# Patient Record
Sex: Female | Born: 1990 | Race: White | Hispanic: No | Marital: Married | Smoking: Never smoker
Health system: Southern US, Community
[De-identification: ages and names within clinical notes are randomized; demographics above are authoritative.]

## PROBLEM LIST (undated history)

## (undated) DIAGNOSIS — B019 Varicella without complication: Secondary | ICD-10-CM

## (undated) DIAGNOSIS — Z8041 Family history of malignant neoplasm of ovary: Secondary | ICD-10-CM

## (undated) DIAGNOSIS — F32A Depression, unspecified: Secondary | ICD-10-CM

## (undated) DIAGNOSIS — F329 Major depressive disorder, single episode, unspecified: Secondary | ICD-10-CM

## (undated) DIAGNOSIS — B009 Herpesviral infection, unspecified: Secondary | ICD-10-CM

## (undated) DIAGNOSIS — F419 Anxiety disorder, unspecified: Secondary | ICD-10-CM

## (undated) DIAGNOSIS — Z808 Family history of malignant neoplasm of other organs or systems: Secondary | ICD-10-CM

## (undated) DIAGNOSIS — Z8042 Family history of malignant neoplasm of prostate: Secondary | ICD-10-CM

## (undated) HISTORY — DX: Anxiety disorder, unspecified: F41.9

## (undated) HISTORY — DX: Varicella without complication: B01.9

## (undated) HISTORY — DX: Herpesviral infection, unspecified: B00.9

## (undated) HISTORY — DX: Major depressive disorder, single episode, unspecified: F32.9

## (undated) HISTORY — DX: Family history of malignant neoplasm of other organs or systems: Z80.8

## (undated) HISTORY — DX: Family history of malignant neoplasm of ovary: Z80.41

## (undated) HISTORY — PX: WISDOM TOOTH EXTRACTION: SHX21

## (undated) HISTORY — DX: Depression, unspecified: F32.A

## (undated) HISTORY — DX: Family history of malignant neoplasm of prostate: Z80.42

---

## 2011-07-02 DIAGNOSIS — B009 Herpesviral infection, unspecified: Secondary | ICD-10-CM

## 2011-07-02 HISTORY — DX: Herpesviral infection, unspecified: B00.9

## 2011-08-31 ENCOUNTER — Ambulatory Visit (INDEPENDENT_AMBULATORY_CARE_PROVIDER_SITE_OTHER): Payer: BC Managed Care – HMO | Admitting: Obstetrics and Gynecology

## 2011-08-31 ENCOUNTER — Encounter: Payer: Self-pay | Admitting: Obstetrics and Gynecology

## 2011-08-31 VITALS — BP 94/58 | Ht 66.0 in | Wt 133.0 lb

## 2011-08-31 DIAGNOSIS — R110 Nausea: Secondary | ICD-10-CM

## 2011-08-31 DIAGNOSIS — N390 Urinary tract infection, site not specified: Secondary | ICD-10-CM

## 2011-08-31 DIAGNOSIS — A6000 Herpesviral infection of urogenital system, unspecified: Secondary | ICD-10-CM

## 2011-08-31 DIAGNOSIS — N946 Dysmenorrhea, unspecified: Secondary | ICD-10-CM

## 2011-08-31 LAB — POCT URINALYSIS DIPSTICK
Blood, UA: NEGATIVE
Ketones, UA: NEGATIVE
Protein, UA: NEGATIVE
Spec Grav, UA: 1.015
pH, UA: 5

## 2011-08-31 LAB — POCT URINE PREGNANCY: Preg Test, Ur: NEGATIVE

## 2011-08-31 NOTE — Progress Notes (Signed)
Contraception: none History of STD:  hsv 2  History of ovarian cyst: yes:  12/12 History of fibroids: no History of endometriosis:no Previous ultrasound:yes:  12/12  Urinary symptoms: none Gastro-intestinal symptoms:  Constipation: no     Diarrhea: no     Nausea: yes     Vomiting: no     Fever: no Vaginal discharge: consistency thin mucoid  Subjective:    Valerie Graham is a 21 y.o. female, G0P0000, who presents for irregular cycles,menometrorrhagia and severe and  Dysmenorrhea. Trial of tri-sprintec x 1 month with no change because pt d/c before completing. Lysteda for 2 cycles = no change. Sono 03/2012 normal with small hemorrhagic cyst (CL cyst)  Menarche at 13. Cycle was normal until 2 years ago. BTB was during aborted trial of OCP.    History   Social History  . Marital Status: Single    Spouse Name: N/A    Number of Children: N/A  . Years of Education: N/A   Social History Main Topics  . Smoking status: Never Smoker   . Smokeless tobacco: Never Used  . Alcohol Use: No  . Drug Use: No  . Sexually Active: Yes    Birth Control/ Protection: None   Other Topics Concern  . None   Social History Narrative  . None    Menstrual cycle:   LMP: Patient's last menstrual period was 08/19/2011.           Cycle: flow is excessive with use of 6 pads or tampons on heaviest days, irregular occurring approximately every 14-30 days with intermenstrual spotting, usually lasting 4-5 to days and Regular,with clots up to 1-2 cm and  severe dysmenorrha starting 1 week before and lasting 2 extra days.Pain is low pelvic,7/10, Ibuprofen = 3/10, radiates to legs.No dyspareunia.  The following portions of the patient's history were reviewed and updated as appropriate: allergies, current medications, past family history, past medical history, past social history, past surgical history and problem list.  Review of Systems Pertinent items are noted in HPI. Breast:Negative for breast  lump,nipple discharge or nipple retraction Gastrointestinal: Negative for abdominal pain, change in bowel habits or rectal bleeding Urinary:negative   Objective:    BP 94/58  Ht 5\' 6"  (1.676 m)  Wt 133 lb (60.328 kg)  BMI 21.47 kg/m2  LMP 08/19/2011    Weight:  Wt Readings from Last 1 Encounters:  08/31/11 133 lb (60.328 kg)          BMI: Body mass index is 21.47 kg/(m^2).  General Appearance: Alert, appropriate appearance for age. No acute distress HEENT: Grossly normal Neck / Thyroid: Supple, no masses, nodes or enlargement Lungs: clear to auscultation bilaterally Back: No CVA tenderness Cardiovascular: Regular rate and rhythm. S1, S2, no murmur Gastrointestinal: Soft, non-tender, no masses or organomegaly Pelvic Exam: Adnexa: normal bimanual exam Uterus: normal single, nontender and anteverted Rectovaginal: not indicated Lymphatic Exam: Non-palpable nodes in neck, clavicular, axillary, or inguinal regions Skin: no rash or abnormalities Neurologic: Normal gait and speech, no tremor  Psychiatric: Alert and oriented, appropriate affect.   Wet Prep:not applicable Urinalysis:not applicable UPT: Not done   Assessment:    Normal gyn exam Primary dysmenorrhea. No high suspicion of endometriosis    Plan:    Lo-lo x 3 months  Combined oral contraceptives reviewed With expected benefits of: cycle control, reduction in menstrual flow and dysmenorrhea, improvement of PMS and reduction of ovarian cysts. Risks of DVT/PE discussed. FU 3 months      Estalene Bergey AMD

## 2011-10-02 ENCOUNTER — Telehealth: Payer: Self-pay | Admitting: Obstetrics and Gynecology

## 2011-10-02 NOTE — Telephone Encounter (Signed)
Valerie Graham/SR pt/elect.

## 2011-10-02 NOTE — Telephone Encounter (Signed)
Pt states that since starting BCP from last ov, she has been having constant cramping like period cramps.  She takes advil with relief but never goes away completely.  Denies bowel symptoms.  Appt or observe?  ld

## 2011-10-02 NOTE — Telephone Encounter (Signed)
LMTC @ 3:05  ld

## 2011-10-04 NOTE — Telephone Encounter (Signed)
Please schedule appointment.

## 2011-10-06 ENCOUNTER — Telehealth: Payer: Self-pay | Admitting: Obstetrics and Gynecology

## 2011-10-06 NOTE — Telephone Encounter (Signed)
LM that pt needs to sch appt to see SR per SR.  Pt can speak with me or anyone in scheduling for next available.  ld

## 2011-10-06 NOTE — Telephone Encounter (Signed)
appt sch 10-26-11  ld

## 2011-10-06 NOTE — Telephone Encounter (Signed)
Laura/SR pt. °

## 2011-10-26 ENCOUNTER — Encounter: Payer: BC Managed Care – HMO | Admitting: Obstetrics and Gynecology

## 2011-11-01 ENCOUNTER — Encounter: Payer: BC Managed Care – HMO | Admitting: Obstetrics and Gynecology

## 2012-05-01 HISTORY — PX: TONSILLECTOMY: SUR1361

## 2015-05-24 ENCOUNTER — Encounter: Payer: Self-pay | Admitting: Gastroenterology

## 2015-07-13 ENCOUNTER — Ambulatory Visit: Payer: Self-pay | Admitting: Gastroenterology

## 2017-02-15 ENCOUNTER — Other Ambulatory Visit: Payer: Self-pay

## 2017-02-15 ENCOUNTER — Emergency Department (INDEPENDENT_AMBULATORY_CARE_PROVIDER_SITE_OTHER): Payer: 59

## 2017-02-15 ENCOUNTER — Emergency Department (INDEPENDENT_AMBULATORY_CARE_PROVIDER_SITE_OTHER)
Admission: EM | Admit: 2017-02-15 | Discharge: 2017-02-15 | Disposition: A | Discharge status: Home / Self Care | Payer: 59 | Source: Home / Self Care | Attending: Internal Medicine | Admitting: Internal Medicine

## 2017-02-15 DIAGNOSIS — R0602 Shortness of breath: Secondary | ICD-10-CM

## 2017-02-15 DIAGNOSIS — R509 Fever, unspecified: Secondary | ICD-10-CM | POA: Diagnosis not present

## 2017-02-15 DIAGNOSIS — B349 Viral infection, unspecified: Secondary | ICD-10-CM

## 2017-02-15 DIAGNOSIS — R079 Chest pain, unspecified: Secondary | ICD-10-CM

## 2017-02-15 LAB — POCT URINE PREGNANCY: PREG TEST UR: NEGATIVE

## 2017-02-15 LAB — POCT RAPID STREP A (OFFICE): RAPID STREP A SCREEN: NEGATIVE

## 2017-02-15 MED ORDER — ALBUTEROL SULFATE HFA 108 (90 BASE) MCG/ACT IN AERS: 1.0000 | INHALATION_SPRAY | Freq: Four times a day (QID) | RESPIRATORY_TRACT | 0 refills | Status: DC | PRN

## 2017-02-15 MED ORDER — BENZONATATE 100 MG PO CAPS: 100.0000 mg | ORAL_CAPSULE | Freq: Three times a day (TID) | ORAL | 0 refills | Status: DC

## 2017-02-15 MED ORDER — FLUTICASONE PROPIONATE 50 MCG/ACT NA SUSP: 2.0000 | Freq: Every day | NASAL | 0 refills | Status: DC

## 2017-02-15 MED ORDER — CETIRIZINE-PSEUDOEPHEDRINE ER 5-120 MG PO TB12: 1.0000 | ORAL_TABLET | Freq: Every day | ORAL | 0 refills | Status: DC

## 2017-02-15 MED ORDER — HYDROCOD POLST-CPM POLST ER 10-8 MG/5ML PO SUER: 5.0000 mL | Freq: Two times a day (BID) | ORAL | 0 refills | Status: DC | PRN

## 2017-02-15 MED FILL — BENZONATATE 100 MG CAPS: 100 | 7 days supply | Qty: 21 | Fill #0

## 2017-02-15 MED FILL — HYDROCODONE-CHLORPHEN ER SU: 10-8 | 12 days supply | Qty: 115 | Fill #0

## 2017-02-15 MED FILL — VENTOLIN HFA 90 MCG INHALER: 108 (90 BAS | 25 days supply | Qty: 18 | Fill #0

## 2017-02-15 NOTE — ED Triage Notes (Signed)
Pt stated that yesterday morning had a runny nose, worked.  After work, started coughing, took Robitussin, and woke up during the night with chest hurting, upper back pain, sore throat and chills.

## 2017-02-15 NOTE — ED Provider Notes (Signed)
[facility]    CSN: [id number] Arrival date & time: 02/15/17  0904      History   Chief Complaint Chief Complaint  Patient presents with  . Nasal Congestion  . Fever  . Generalized Body Aches    HPI Valerie Graham is a 26 y.o. female.   26 year old female comes in for 2 day history of nasal congestion, subjective fever, body aches. She states symptoms first started as rhinorrhea, and is now also having productive cough, sore throat, right ear pain. She has also noticed shortness of breath and dyspnea on exertion, she thinks it is due to nasal congestion and having to breath out of her mouth. She is also exhibiting pleuritic chest pain and upper back pain. Has been trying robitussin with minimal relief. Denies sick contact. Denies history of seasonal allergies, asthma. Never smoker.   Patient denies history of PE/DVT, no recent long hours of immobilization, no birth control use, no unilateral leg swelling.       Past Medical History:  Diagnosis Date  . Herpes simplex without mention of complication 07/02/11    Patient Active Problem List   Diagnosis Date Noted  . Herpes genitalis 08/31/2011    History reviewed. No pertinent surgical history.  OB History    Gravida Para Term Preterm AB Living   0 0 0 0 0 0   SAB TAB Ectopic Multiple Live Births   0 0 0 0         Home Medications    Prior to Admission medications   Medication Sig Start Date End Date Taking? Authorizing Provider  albuterol (PROVENTIL HFA;VENTOLIN HFA) 108 (90 Base) MCG/ACT inhaler Inhale 1-2 puffs into the lungs every 6 (six) hours as needed for wheezing or shortness of breath. 02/15/17   Cathie Hoops, Kelsey Durflinger V, PA-C  benzonatate (TESSALON) 100 MG capsule Take 1 capsule (100 mg total) by mouth every 8 (eight) hours. 02/15/17   Cathie Hoops, Anayeli Arel V, PA-C  cetirizine-pseudoephedrine (ZYRTEC-D) 5-120 MG tablet Take 1 tablet by mouth daily. 02/15/17   Cathie Hoops, Mikhai Bienvenue V, PA-C  chlorpheniramine-HYDROcodone (TUSSIONEX  PENNKINETIC ER) 10-8 MG/5ML SUER Take 5 mLs by mouth every 12 (twelve) hours as needed for cough. 02/15/17   Cathie Hoops, Aveer Bartow V, PA-C  fluticasone (FLONASE) 50 MCG/ACT nasal spray Place 2 sprays into both nostrils daily. 02/15/17   Cathie Hoops, Jesyca Weisenburger V, PA-C  ibuprofen (ADVIL,MOTRIN) 800 MG tablet Take 800 mg by mouth every 8 (eight) hours as needed.    [provider]  tranexamic acid (LYSTEDA) 650 MG TABS Take 1,300 mg by mouth.    [provider]    Family History Family History  Problem Relation Age of Onset  . Cancer Paternal Grandfather   . Cancer Maternal Grandmother   . Diabetes Maternal Aunt     Social History Social History  Substance Use Topics  . Smoking status: Never Smoker  . Smokeless tobacco: Never Used  . Alcohol use No     Allergies   Patient has no known allergies.   Review of Systems Review of Systems  Reason unable to perform ROS: See HPI as above.     Physical Exam Triage Vital Signs ED Triage Vitals [02/15/17 0921]  Enc Vitals Group     BP 109/75     Pulse Rate (!) 111     Resp      Temp 99.5 F (37.5 C)     Temp Source Oral     SpO2 97 %  Weight 180 lb (81.6 kg)     Height 5\' 7"  (1.702 m)     Head Circumference      Peak Flow      Pain Score      Pain Loc      Pain Edu?      Excl. in GC?    No data found.   Updated Vital Signs BP 109/75 (BP Location: Left Arm)   Pulse (!) 111   Temp 99.5 F (37.5 C) (Oral)   Ht 5\' 7"  (1.702 m)   Wt 180 lb (81.6 kg)   LMP 01/18/2017   SpO2 97%   BMI 28.19 kg/m   Physical Exam  Constitutional: She is oriented to person, place, and time. She appears well-developed and well-nourished. No distress.  HENT:  Head: Normocephalic and atraumatic.  Right Ear: External ear and ear canal normal. Tympanic membrane is erythematous. Tympanic membrane is not bulging.  Left Ear: External ear and ear canal normal. Tympanic membrane is erythematous. Tympanic membrane is not bulging.  Nose: Mucosal edema  and rhinorrhea present. Right sinus exhibits no maxillary sinus tenderness and no frontal sinus tenderness. Left sinus exhibits no maxillary sinus tenderness and no frontal sinus tenderness.  Mouth/Throat: Uvula is midline and mucous membranes are normal. Posterior oropharyngeal erythema present.  Eyes: Pupils are equal, round, and reactive to light. Conjunctivae are normal.  Neck: Normal range of motion. Neck supple.  Cardiovascular: Normal rate, regular rhythm and normal heart sounds.  Exam reveals no gallop and no friction rub.   No murmur heard. Pulmonary/Chest: Effort normal.  Expiratory wheezing on left lower base with mild crackles that is not resolved by cough.   Musculoskeletal:  No swelling, erythema, increased warmth. Negative Homan's   Lymphadenopathy:    She has no cervical adenopathy.  Neurological: She is alert and oriented to person, place, and time.  Skin: Skin is warm and dry.  Psychiatric: She has a normal mood and affect. Her behavior is normal. Judgment normal.    UC Treatments / Results  Labs (all labs ordered are listed, but only abnormal results are displayed) Labs Reviewed  POCT RAPID STREP A (OFFICE)  POCT URINE PREGNANCY    EKG  EKG Interpretation None       Radiology Dg Chest 2 View  Result Date: 02/15/2017 CLINICAL DATA:  Shortness of breath, chest pain. EXAM: CHEST  2 VIEW COMPARISON:  None. FINDINGS: The heart size and mediastinal contours are within normal limits. Both lungs are clear. No pneumothorax or pleural effusion is noted. The visualized skeletal structures are unremarkable. IMPRESSION: No active cardiopulmonary disease. Electronically Signed   By: Lupita RaiderJames  Green Jr, M.D.   On: 02/15/2017 10:06    Procedures Procedures (including critical care time)  Medications Ordered in UC Medications - No data to display   Initial Impression / Assessment and Plan / UC Course  I have reviewed the triage vital signs and the nursing  notes.  Pertinent labs & imaging results that were available during my care of the patient were reviewed by me and considered in my medical decision making (see chart for details).  Clinical Course as of Feb 16 1040  Thu Feb 15, 2017  1024 Recheck pulse irregular. Will check EKG  [AY]    Clinical Course User Index [AY] Kaaren Nass V, PA-C   CXR negative for pneumonia. EKG showed sinus tachycardia with occasional PVCs, 103 bpm, no other acute changes seen. Patient's HR improved with fluid intake, patient without risk  factors for DVT/PE, low suspicion for PE at this time. Discussed with patient symptoms could be due to viral illness. Given shortness of breath with mild wheezing on exam, will give albuterol inhaler PRN. Other symptomatic treatment provided. Return precautions given.   Final Clinical Impressions(s) / UC Diagnoses   Final diagnoses:  Viral illness    New Prescriptions New Prescriptions   ALBUTEROL (PROVENTIL HFA;VENTOLIN HFA) 108 (90 BASE) MCG/ACT INHALER    Inhale 1-2 puffs into the lungs every 6 (six) hours as needed for wheezing or shortness of breath.   BENZONATATE (TESSALON) 100 MG CAPSULE    Take 1 capsule (100 mg total) by mouth every 8 (eight) hours.   CETIRIZINE-PSEUDOEPHEDRINE (ZYRTEC-D) 5-120 MG TABLET    Take 1 tablet by mouth daily.   CHLORPHENIRAMINE-HYDROCODONE (TUSSIONEX PENNKINETIC ER) 10-8 MG/5ML SUER    Take 5 mLs by mouth every 12 (twelve) hours as needed for cough.   FLUTICASONE (FLONASE) 50 MCG/ACT NASAL SPRAY    Place 2 sprays into both nostrils daily.      Belinda Fisher, PA-C 02/15/17 1042

## 2017-02-15 NOTE — Discharge Instructions (Signed)
CXR negative for pneumonia. EKG without acute changes. Tessalon for cough. Tussionex for cough at night. Albuterol inhaler for shortness of breath/wheezing. Start flonase, zyrtec-D for nasal congestion. You can use over the counter nasal saline rinse such as neti pot for nasal congestion. Keep hydrated, your urine should be clear to pale yellow in color. Tylenol/motrin for fever and pain. Monitor for any worsening of symptoms, chest pain, shortness of breath, wheezing, swelling of the throat, go to the emergency department for further evaluation.

## 2017-07-12 ENCOUNTER — Encounter: Payer: Self-pay | Admitting: Nurse Practitioner

## 2017-07-12 ENCOUNTER — Ambulatory Visit (INDEPENDENT_AMBULATORY_CARE_PROVIDER_SITE_OTHER): Payer: No Typology Code available for payment source | Admitting: Nurse Practitioner

## 2017-07-12 ENCOUNTER — Ambulatory Visit (INDEPENDENT_AMBULATORY_CARE_PROVIDER_SITE_OTHER): Payer: No Typology Code available for payment source

## 2017-07-12 VITALS — BP 104/72 | HR 69 | Temp 97.7°F | Ht 67.0 in | Wt 178.0 lb

## 2017-07-12 DIAGNOSIS — R1030 Lower abdominal pain, unspecified: Secondary | ICD-10-CM

## 2017-07-12 DIAGNOSIS — F325 Major depressive disorder, single episode, in full remission: Secondary | ICD-10-CM

## 2017-07-12 DIAGNOSIS — K5900 Constipation, unspecified: Secondary | ICD-10-CM

## 2017-07-12 LAB — POCT URINALYSIS DIPSTICK
Bilirubin, UA: NEGATIVE
Glucose, UA: NEGATIVE
KETONES UA: NEGATIVE
Leukocytes, UA: NEGATIVE
NITRITE UA: NEGATIVE
PH UA: 6 (ref 5.0–8.0)
PROTEIN UA: NEGATIVE
RBC UA: NEGATIVE
SPEC GRAV UA: 1.02 (ref 1.010–1.025)
UROBILINOGEN UA: 0.2 U/dL

## 2017-07-12 LAB — POCT URINE PREGNANCY: Preg Test, Ur: NEGATIVE

## 2017-07-12 MED ORDER — SENNOSIDES-DOCUSATE SODIUM 8.6-50 MG PO TABS: 1.0000 | ORAL_TABLET | Freq: Every evening | ORAL | 0 refills | Status: DC | PRN

## 2017-07-12 MED ORDER — BUSPIRONE HCL 15 MG PO TABS: 15.0000 mg | ORAL_TABLET | Freq: Two times a day (BID) | ORAL | 5 refills | Status: DC

## 2017-07-12 MED FILL — busPIRone HCL 15 MG TABS: 15 | 30 days supply | Qty: 60 | Fill #0

## 2017-07-12 NOTE — Progress Notes (Signed)
Subjective:  Patient ID: Valerie Graham, female    DOB: June 20, 1990  Age: 27 y.o. MRN: [id number]  CC: Establish Care (est care Buspirone refill)   Constipation  This is a chronic problem. The current episode started more than 1 year ago. The problem has been waxing and waning since onset. Her stool frequency is 2 to 3 times per week. The stool is described as pellet like. The patient is not on a high fiber diet. She does not exercise regularly. There has not been adequate water intake. Associated symptoms include abdominal pain, anorexia and bloating. Pertinent negatives include no back pain, diarrhea, difficulty urinating, fecal incontinence, fever, flatus, hematochezia, hemorrhoids, melena, nausea, rectal pain, vomiting or weight loss. Associated symptoms comments: Mucus in stool. Risk factors include dietary change and stress (excessive use of caffeine). She has tried nothing for the symptoms. Her past medical history is significant for psychiatric history. There is no history of irritable bowel syndrome.  Anxiety  Presents for initial visit. Onset was 1 to 6 months ago. The problem has been unchanged. Symptoms include decreased concentration, depressed mood, excessive worry, irritability, nervous/anxious behavior and restlessness. Patient reports no nausea. The symptoms are aggravated by caffeine (suicide of boyfriend 1year ago). The quality of sleep is good.   Risk factors include a major life event. Her past medical history is significant for anxiety/panic attacks. There is no history of suicide attempts. Past treatments include non-SSRI antidepressants and counseling (CBT). The treatment provided significant relief. Compliance with prior treatments has been good.   No previous pcp.  Current GYN: Dr. Chestine Sporelark [facility]) Last CPE with PAP with GYN 09/2016 No hx of abnormal PAP. Sexually active with use of condom. Heavy and painful menstrual cycles Use of depoprovera in past, stopped  due to weight gain. FHx of endometriosis (mother with hysterectomy at age 27).  Constipation: onset 5770yr ago. Waxing and waning. No hematochezia, no melena Describes stool as balls mixed with mucus. not improved with probiotic Valerie Graham(Valerie Graham brand).  Outpatient Medications Prior to Visit  Medication Sig Dispense Refill  . ibuprofen (ADVIL,MOTRIN) 800 MG tablet Take 800 mg by mouth every 8 (eight) hours as needed.    . busPIRone (BUSPAR) 15 MG tablet buspirone 15 mg tablet    . tranexamic acid (LYSTEDA) 650 MG TABS Take 1,300 mg by mouth.    Marland Kitchen. albuterol (PROVENTIL HFA;VENTOLIN HFA) 108 (90 Base) MCG/ACT inhaler Inhale 1-2 puffs into the lungs every 6 (six) hours as needed for wheezing or shortness of breath. (Patient not taking: Reported on 07/12/2017) 1 Inhaler 0  . benzonatate (TESSALON) 100 MG capsule Take 1 capsule (100 mg total) by mouth every 8 (eight) hours. (Patient not taking: Reported on 07/12/2017) 21 capsule 0  . cetirizine-pseudoephedrine (ZYRTEC-D) 5-120 MG tablet Take 1 tablet by mouth daily. (Patient not taking: Reported on 07/12/2017) 15 tablet 0  . chlorpheniramine-HYDROcodone (TUSSIONEX PENNKINETIC ER) 10-8 MG/5ML SUER Take 5 mLs by mouth every 12 (twelve) hours as needed for cough. (Patient not taking: Reported on 07/12/2017) 115 mL 0  . fluticasone (FLONASE) 50 MCG/ACT nasal spray Place 2 sprays into both nostrils daily. (Patient not taking: Reported on 07/12/2017) 1 g 0   No facility-administered medications prior to visit.    Social History   Socioeconomic History  . Marital status: Single    Spouse name: None  . Number of children: None  . Years of education: None  . Highest education level: None  Social Needs  . Financial resource strain: None  .  Food insecurity - worry: None  . Food insecurity - inability: None  . Transportation needs - medical: None  . Transportation needs - non-medical: None  Occupational History  . None  Tobacco Use  . Smoking status: Never  Smoker  . Smokeless tobacco: Never Used  Substance and Sexual Activity  . Alcohol use: No  . Drug use: No  . Sexual activity: Yes    Birth control/protection: Condom  Other Topics Concern  . None  Social History Narrative  . None   Family History  Problem Relation Age of Onset  . Cancer Paternal Grandfather   . Cancer Maternal Grandmother 76       breast  . Endometriosis Mother 30       hysterectomy  . Diabetes Maternal Aunt    ROS See HPI  Objective:  BP 104/72   Pulse 69   Temp 97.7 F (36.5 C)   Ht 5\' 7"  (1.702 m)   Wt 178 lb (80.7 kg)   LMP 06/25/2017   SpO2 99%   BMI 27.88 kg/m   BP Readings from Last 3 Encounters:  07/12/17 104/72  02/15/17 109/75  08/31/11 (!) 94/58    Wt Readings from Last 3 Encounters:  07/12/17 178 lb (80.7 kg)  02/15/17 180 lb (81.6 kg)  08/31/11 133 lb (60.3 kg)    Physical Exam  Constitutional: She is oriented to person, place, and time.  Cardiovascular: Normal rate.  Pulmonary/Chest: Effort normal.  Abdominal: Soft. Bowel sounds are normal. She exhibits no distension. There is tenderness. There is no rebound and no guarding.  Neurological: She is alert and oriented to person, place, and time.  Psychiatric: She has a normal mood and affect. Her behavior is normal. Judgment and thought content normal.  Vitals reviewed.   No results found for: WBC, HGB, HCT, PLT, GLUCOSE, CHOL, TRIG, HDL, LDLDIRECT, LDLCALC, ALT, AST, NA, K, CL, CREATININE, BUN, CO2, TSH, PSA, INR, GLUF, HGBA1C, MICROALBUR  Dg Chest 2 View  Result Date: 02/15/2017 CLINICAL DATA:  Shortness of breath, chest pain. EXAM: CHEST  2 VIEW COMPARISON:  None. FINDINGS: The heart size and mediastinal contours are within normal limits. Both lungs are clear. No pneumothorax or pleural effusion is noted. The visualized skeletal structures are unremarkable. IMPRESSION: No active cardiopulmonary disease. Electronically Signed   By: Lupita Raider, M.D.   On: 02/15/2017  10:06    Assessment & Plan:   Valerie Graham was seen today for establish care.  Diagnoses and all orders for this visit:  Lower abdominal pain -     POCT urinalysis dipstick -     POCT urine pregnancy -     DG Abd 1 View; Future -     DG Abd 1 View  Constipation, unspecified constipation type -     POCT urinalysis dipstick -     POCT urine pregnancy -     senna-docusate (SENOKOT-S) 8.6-50 MG tablet; Take 1 tablet by mouth at bedtime as needed for moderate constipation. -     DG Abd 1 View; Future -     DG Abd 1 View  Depression, major, single episode, complete remission (HCC) -     busPIRone (BUSPAR) 15 MG tablet; Take 1 tablet (15 mg total) by mouth 2 (two) times daily.   I have discontinued Evelise Cua's benzonatate, chlorpheniramine-HYDROcodone, fluticasone, cetirizine-pseudoephedrine, and albuterol. I have also changed her busPIRone. Additionally, I am having her start on senna-docusate. Lastly, I am having her maintain her tranexamic acid and  ibuprofen.  Meds ordered this encounter  Medications  . senna-docusate (SENOKOT-S) 8.6-50 MG tablet    Sig: Take 1 tablet by mouth at bedtime as needed for moderate constipation.    Dispense:  30 tablet    Refill:  0    Order Specific Question:   Supervising Provider    Answer:   Dianne Dun [[id number]]  . busPIRone (BUSPAR) 15 MG tablet    Sig: Take 1 tablet (15 mg total) by mouth 2 (two) times daily.    Dispense:  60 tablet    Refill:  5    Order Specific Question:   Supervising Provider    Answer:   Dianne Dun [[id number]]    Follow-up: Return in about 4 weeks (around 08/09/2017) for CPE (fasting).  Alysia Penna, NP

## 2017-07-12 NOTE — Patient Instructions (Addendum)
onstipation without obstruction. Use sennakot as discussed. Make diet changes as discussed  Constipation, Adult Constipation is when a person has fewer bowel movements in a week than normal, has difficulty having a bowel movement, or has stools that are dry, hard, or larger than normal. Constipation may be caused by an underlying condition. It may become worse with age if a person takes certain medicines and does not take in enough fluids. Follow these instructions at home: Eating and drinking   Eat foods that have a lot of fiber, such as fresh fruits and vegetables, whole grains, and beans.  Limit foods that are high in fat, low in fiber, or overly processed, such as french fries, hamburgers, cookies, candies, and soda.  Drink enough fluid to keep your urine clear or pale yellow. General instructions  Exercise regularly or as told by your health care provider.  Go to the restroom when you have the urge to go. Do not hold it in.  Take over-the-counter and prescription medicines only as told by your health care provider. These include any fiber supplements.  Practice pelvic floor retraining exercises, such as deep breathing while relaxing the lower abdomen and pelvic floor relaxation during bowel movements.  Watch your condition for any changes.  Keep all follow-up visits as told by your health care provider. This is important. Contact a health care provider if:  You have pain that gets worse.  You have a fever.  You do not have a bowel movement after 4 days.  You vomit.  You are not hungry.  You lose weight.  You are bleeding from the anus.  You have thin, pencil-like stools. Get help right away if:  You have a fever and your symptoms suddenly get worse.  You leak stool or have blood in your stool.  Your abdomen is bloated.  You have severe pain in your abdomen.  You feel dizzy or you faint. This information is not intended to replace advice given to you by  your health care provider. Make sure you discuss any questions you have with your health care provider. Document Released: 01/14/2004 Document Revised: 11/05/2015 Document Reviewed: 10/06/2015 Elsevier Interactive Patient Education  2018 [employer]

## 2017-07-13 ENCOUNTER — Ambulatory Visit: Payer: Self-pay

## 2017-07-13 ENCOUNTER — Encounter: Payer: Self-pay | Admitting: Nurse Practitioner

## 2017-07-13 ENCOUNTER — Telehealth: Payer: Self-pay | Admitting: Nurse Practitioner

## 2017-07-13 NOTE — Telephone Encounter (Signed)
Patient called, she says "someone already went over them with me."

## 2017-07-13 NOTE — Telephone Encounter (Signed)
Copied from CRM [id number]. Topic: Quick Communication - See Telephone Encounter >> Jul 13, 2017 11:29 AM Landry MellowFoltz, Melissa J wrote: CRM for notification. See Telephone encounter for:   07/13/17. Pt calling about her labs

## 2017-07-13 NOTE — Telephone Encounter (Signed)
Patient called for results, left VM to return call. Charted in result notes.

## 2017-08-10 ENCOUNTER — Encounter: Payer: No Typology Code available for payment source | Admitting: Nurse Practitioner

## 2017-09-19 ENCOUNTER — Other Ambulatory Visit (HOSPITAL_COMMUNITY)
Admission: RE | Admit: 2017-09-19 | Discharge: 2017-09-19 | Disposition: A | Discharge status: Home / Self Care | Payer: No Typology Code available for payment source | Source: Ambulatory Visit | Attending: Nurse Practitioner | Admitting: Nurse Practitioner

## 2017-09-19 ENCOUNTER — Encounter: Payer: Self-pay | Admitting: Nurse Practitioner

## 2017-09-19 ENCOUNTER — Ambulatory Visit (INDEPENDENT_AMBULATORY_CARE_PROVIDER_SITE_OTHER): Payer: No Typology Code available for payment source | Admitting: Nurse Practitioner

## 2017-09-19 VITALS — BP 106/74 | HR 76 | Temp 97.8°F | Ht 67.0 in | Wt 172.0 lb

## 2017-09-19 DIAGNOSIS — N898 Other specified noninflammatory disorders of vagina: Secondary | ICD-10-CM

## 2017-09-19 DIAGNOSIS — R1030 Lower abdominal pain, unspecified: Secondary | ICD-10-CM | POA: Insufficient documentation

## 2017-09-19 LAB — POCT URINALYSIS DIPSTICK
Bilirubin, UA: NEGATIVE
Blood, UA: NEGATIVE
Glucose, UA: NEGATIVE
Ketones, UA: NEGATIVE
Leukocytes, UA: NEGATIVE
NITRITE UA: NEGATIVE
PROTEIN UA: NEGATIVE
Spec Grav, UA: 1.015 (ref 1.010–1.025)
Urobilinogen, UA: 0.2 E.U./dL
pH, UA: 6 (ref 5.0–8.0)

## 2017-09-19 LAB — POCT URINE PREGNANCY: PREG TEST UR: NEGATIVE

## 2017-09-19 MED ORDER — FLUCONAZOLE 150 MG PO TABS: 150.0000 mg | ORAL_TABLET | Freq: Once | ORAL | 0 refills | Status: AC

## 2017-09-19 MED FILL — FLUCONAZOLE 150 MG TABS: 150 | 1 days supply | Qty: 1 | Fill #0

## 2017-09-19 NOTE — Patient Instructions (Signed)

## 2017-09-19 NOTE — Progress Notes (Signed)
Subjective:  Patient ID: Valerie Graham, female    DOB: 06-01-1990  Age: 27 y.o. MRN: [id number]  CC: Urinary Tract Infection (itchy and abd cramping/2 days. )  Vaginal Itching  The patient's primary symptoms include genital itching. The patient's pertinent negatives include no genital lesions, genital odor, genital rash, missed menses, pelvic pain, vaginal bleeding or vaginal discharge. This is a new problem. The current episode started in the past 7 days. The problem occurs constantly. The problem has been unchanged. The problem affects the right side. She is not pregnant. Associated symptoms include abdominal pain and constipation. Pertinent negatives include no anorexia, back pain, chills, diarrhea, dysuria, fever, frequency, joint swelling, nausea, painful intercourse, rash, sore throat, urgency or vomiting. She is sexually active. No, her partner does not have an STD. She uses condoms for contraception. Her menstrual history has been regular. Her past medical history is significant for vaginosis. There is no history of PID or an STD.  last BM this morning, soft per patient.  Reviewed social hx.  Outpatient Medications Prior to Visit  Medication Sig Dispense Refill  . busPIRone (BUSPAR) 15 MG tablet Take 1 tablet (15 mg total) by mouth 2 (two) times daily. 60 tablet 5  . ibuprofen (ADVIL,MOTRIN) 800 MG tablet Take 800 mg by mouth every 8 (eight) hours as needed.    . senna-docusate (SENOKOT-S) 8.6-50 MG tablet Take 1 tablet by mouth at bedtime as needed for moderate constipation. 30 tablet 0  . tranexamic acid (LYSTEDA) 650 MG TABS Take 1,300 mg by mouth.     No facility-administered medications prior to visit.     ROS See HPI  Objective:  BP 106/74   Pulse 76   Temp 97.8 F (36.6 C) (Oral)   Ht  (1.702 m)   Wt 172 lb (78 kg)   LMP 09/13/2017   SpO2 98%   BMI 26.94 kg/m   BP Readings from Last 3 Encounters:  09/19/17 106/74  07/12/17 104/72  02/15/17 109/75    Wt  Readings from Last 3 Encounters:  09/19/17 172 lb (78 kg)  07/12/17 178 lb (80.7 kg)  02/15/17 180 lb (81.6 kg)    Physical Exam  Constitutional: No distress.  Genitourinary: Rectum normal and vagina normal. Pelvic exam was performed with patient supine. There is no rash or tenderness on the right labia. There is no rash or tenderness on the left labia. Cervix exhibits no motion tenderness, no discharge and no friability. Right adnexum displays no tenderness and no fullness. Left adnexum displays no tenderness and no fullness. No vaginal discharge found.  Vitals reviewed.   No results found for: WBC, HGB, HCT, PLT, GLUCOSE, CHOL, TRIG, HDL, LDLDIRECT, LDLCALC, ALT, AST, NA, K, CL, CREATININE, BUN, CO2, TSH, PSA, INR, GLUF, HGBA1C, MICROALBUR  Dg Chest 2 View  Result Date: 02/15/2017 CLINICAL DATA:  Shortness of breath, chest pain. EXAM: CHEST  2 VIEW COMPARISON:  None. FINDINGS: The heart size and mediastinal contours are within normal limits. Both lungs are clear. No pneumothorax or pleural effusion is noted. The visualized skeletal structures are unremarkable. IMPRESSION: No active cardiopulmonary disease. Electronically Signed   By: Lupita Raider, M.D.   On: 02/15/2017 10:06    Assessment & Plan:   Gissele was seen today for urinary tract infection.  Diagnoses and all orders for this visit:  Lower abdominal pain -     POCT urinalysis dipstick -     POCT urine pregnancy -     Cervicovaginal  ancillary only  Vaginal itching -     Cervicovaginal ancillary only -     fluconazole (DIFLUCAN) 150 MG tablet; Take 1 tablet (150 mg total) by mouth once for 1 dose.   I am having Laurence Aly start on fluconazole. I am also having her maintain her tranexamic acid, ibuprofen, senna-docusate, and busPIRone.  Meds ordered this encounter  Medications  . fluconazole (DIFLUCAN) 150 MG tablet    Sig: Take 1 tablet (150 mg total) by mouth once for 1 dose.    Dispense:  1 tablet     Refill:  0    Order Specific Question:   Supervising Provider    Answer:   Dianne Dun [[id number]]   Follow-up: No follow-ups on file.  Alysia Penna, NP

## 2017-09-21 ENCOUNTER — Ambulatory Visit (INDEPENDENT_AMBULATORY_CARE_PROVIDER_SITE_OTHER): Payer: No Typology Code available for payment source | Admitting: Nurse Practitioner

## 2017-09-21 ENCOUNTER — Encounter: Payer: Self-pay | Admitting: Nurse Practitioner

## 2017-09-21 VITALS — BP 104/74 | HR 74 | Temp 98.1°F | Ht 67.0 in | Wt 171.2 lb

## 2017-09-21 DIAGNOSIS — N760 Acute vaginitis: Secondary | ICD-10-CM | POA: Diagnosis not present

## 2017-09-21 DIAGNOSIS — Z136 Encounter for screening for cardiovascular disorders: Secondary | ICD-10-CM

## 2017-09-21 DIAGNOSIS — Z13220 Encounter for screening for lipoid disorders: Secondary | ICD-10-CM | POA: Diagnosis not present

## 2017-09-21 DIAGNOSIS — Z0000 Encounter for general adult medical examination without abnormal findings: Secondary | ICD-10-CM

## 2017-09-21 DIAGNOSIS — B9689 Other specified bacterial agents as the cause of diseases classified elsewhere: Secondary | ICD-10-CM

## 2017-09-21 LAB — CERVICOVAGINAL ANCILLARY ONLY
Bacterial vaginitis: POSITIVE — AB
CHLAMYDIA, DNA PROBE: NEGATIVE
Candida vaginitis: NEGATIVE
NEISSERIA GONORRHEA: NEGATIVE
Trichomonas: NEGATIVE

## 2017-09-21 MED ORDER — METRONIDAZOLE 0.75 % VA GEL: 1.0000 | Freq: Every day | VAGINAL | 0 refills | Status: DC

## 2017-09-21 MED FILL — metroNIDAZOLE 0.75 % GEL: 0.75 | 5 days supply | Qty: 70 | Fill #0

## 2017-09-21 NOTE — Progress Notes (Signed)
Subjective:    Patient ID: Valerie Graham, female    DOB: Sep 23, 1990, 27 y.o.   MRN: [id number]  Patient presents today for complete physical  HPI   denies any  Acute complains. Improved constipation Resolved ABD pain. Persistent vaginal itching.  Immunizations: (TDAP, Hep C screen, Pneumovax, Influenza, zoster)  Health Maintenance  Topic Date Due  . Pap Smear  05/26/2011  . HIV Screening  09/22/2018*  . Flu Shot  11/29/2017  . Tetanus Vaccine  01/30/2027  *Topic was postponed. The date shown is not the original due date.   Diet:regular.  Weight:  Wt Readings from Last 3 Encounters:  09/21/17 171 lb 3.2 oz (77.7 kg)  09/19/17 172 lb (78 kg)  07/12/17 178 lb (80.7 kg)   Exercise:none.  Fall Risk: Fall Risk  07/12/2017  Falls in the past year? No   Home Safety:home alone.  Depression/Suicide: Depression screen [facility] 2/9 07/12/2017  Decreased Interest 0  Down, Depressed, Hopeless 0  PHQ - 2 Score 0   Pap Smear (every 36yrs for >21-29 without HPV, every 90yrs for >30-77yrs with HPV):done by GYN, up to date per patient.  Vision:up to date  Dental:will schedule  Medications and allergies reviewed with patient and updated if appropriate.  Patient Active Problem List   Diagnosis Date Noted  . Herpes genitalis 08/31/2011    Current Outpatient Medications on File Prior to Visit  Medication Sig Dispense Refill  . busPIRone (BUSPAR) 15 MG tablet Take 1 tablet (15 mg total) by mouth 2 (two) times daily. 60 tablet 5  . ibuprofen (ADVIL,MOTRIN) 800 MG tablet Take 800 mg by mouth every 8 (eight) hours as needed.    . senna-docusate (SENOKOT-S) 8.6-50 MG tablet Take 1 tablet by mouth at bedtime as needed for moderate constipation. 30 tablet 0  . tranexamic acid (LYSTEDA) 650 MG TABS Take 1,300 mg by mouth.     No current facility-administered medications on file prior to visit.     Past Medical History:  Diagnosis Date  . Anxiety   . Chicken pox   . Depression   .  Herpes simplex without mention of complication 07/02/11    Past Surgical History:  Procedure Laterality Date  . TONSILLECTOMY  2014    Social History   Socioeconomic History  . Marital status: Single    Spouse name: Not on file  . Number of children: Not on file  . Years of education: Not on file  . Highest education level: Not on file  Occupational History  . Not on file  Social Needs  . Financial resource strain: Not on file  . Food insecurity:    Worry: Not on file    Inability: Not on file  . Transportation needs:    Medical: Not on file    Non-medical: Not on file  Tobacco Use  . Smoking status: Never Smoker  . Smokeless tobacco: Never Used  Substance and Sexual Activity  . Alcohol use: No  . Drug use: No  . Sexual activity: Yes    Birth control/protection: Condom  Lifestyle  . Physical activity:    Days per week: Not on file    Minutes per session: Not on file  . Stress: Not on file  Relationships  . Social connections:    Talks on phone: Not on file    Gets together: Not on file    Attends religious service: Not on file    Active member of club or organization: Not on file  Attends meetings of clubs or organizations: Not on file    Relationship status: Not on file  Other Topics Concern  . Not on file  Social History Narrative  . Not on file    Family History  Problem Relation Age of Onset  . Cancer Paternal Grandfather   . Cancer Maternal Grandmother 89       breast  . Endometriosis Mother 30       hysterectomy  . Diabetes Maternal Aunt   . Cancer Paternal Uncle 55       prostate        ROS  Objective:   Vitals:   09/21/17 1506  BP: 104/74  Pulse: 74  Temp: 98.1 F (36.7 C)  SpO2: 99%    Body mass index is 26.81 kg/m.   Physical Examination:  Physical Exam  ASSESSMENT and PLAN:  Ryin was seen today for annual exam.  Diagnoses and all orders for this visit:  Preventative health care -     Cancel: CBC -      Comprehensive metabolic panel -     TSH -     Lipid panel -     CBC; Future  Encounter for lipid screening for cardiovascular disease -     Lipid panel  BV (bacterial vaginosis) -     metroNIDAZOLE (METROGEL) 0.75 % vaginal gel; Place 1 Applicatorful vaginally at bedtime.  Other orders -     CBC   No problem-specific Assessment & Plan notes found for this encounter.     Follow up: Return if symptoms worsen or fail to improve.  Alysia Penna, NP

## 2017-09-21 NOTE — Patient Instructions (Signed)

## 2017-09-22 ENCOUNTER — Encounter: Payer: Self-pay | Admitting: Nurse Practitioner

## 2017-09-24 LAB — COMPREHENSIVE METABOLIC PANEL
AG Ratio: 1.1 (calc) (ref 1.0–2.5)
ALT: 9 U/L (ref 6–29)
AST: 14 U/L (ref 10–30)
Albumin: 3.6 g/dL (ref 3.6–5.1)
Alkaline phosphatase (APISO): 51 U/L (ref 33–115)
BUN: 13 mg/dL (ref 7–25)
CO2: 22 mmol/L (ref 20–32)
CREATININE: 0.83 mg/dL (ref 0.50–1.10)
Calcium: 8.4 mg/dL — ABNORMAL LOW (ref 8.6–10.2)
Chloride: 107 mmol/L (ref 98–110)
GLUCOSE: 102 mg/dL — AB (ref 65–99)
Globulin: 3.3 g/dL (calc) (ref 1.9–3.7)
Potassium: 3.7 mmol/L (ref 3.5–5.3)
Sodium: 137 mmol/L (ref 135–146)
TOTAL PROTEIN: 6.9 g/dL (ref 6.1–8.1)
Total Bilirubin: 0.5 mg/dL (ref 0.2–1.2)

## 2017-09-24 LAB — LIPID PANEL
CHOL/HDL RATIO: 2.2 (calc) (ref <5.0)
CHOLESTEROL: 151 mg/dL (ref <200)
HDL: 68 mg/dL (ref >50)
LDL Cholesterol (Calc): 64 mg/dL (calc)
Non-HDL Cholesterol (Calc): 83 mg/dL (calc) (ref <130)
TRIGLYCERIDES: 102 mg/dL (ref <150)

## 2017-09-24 LAB — TSH: TSH: 1.42 mIU/L

## 2017-09-25 ENCOUNTER — Other Ambulatory Visit (INDEPENDENT_AMBULATORY_CARE_PROVIDER_SITE_OTHER): Payer: No Typology Code available for payment source

## 2017-09-25 DIAGNOSIS — Z0000 Encounter for general adult medical examination without abnormal findings: Secondary | ICD-10-CM

## 2017-09-25 LAB — CBC
HCT: 35.4 % — ABNORMAL LOW (ref 36.0–46.0)
Hemoglobin: 12 g/dL (ref 12.0–15.0)
MCHC: 34 g/dL (ref 30.0–36.0)
MCV: 91.2 fl (ref 78.0–100.0)
Platelets: 216 10*3/uL (ref 150.0–400.0)
RBC: 3.88 Mil/uL (ref 3.87–5.11)
RDW: 13.8 % (ref 11.5–15.5)
WBC: 6.6 10*3/uL (ref 4.0–10.5)

## 2017-09-25 NOTE — Addendum Note (Signed)
Addended by: Varney Biles on: 09/25/2017 07:53 AM   Modules accepted: Orders

## 2017-12-25 MED FILL — busPIRone HCL 15 MG TABS: 15 | 30 days supply | Qty: 60 | Fill #1

## 2018-02-18 ENCOUNTER — Other Ambulatory Visit (HOSPITAL_COMMUNITY)
Admission: RE | Admit: 2018-02-18 | Discharge: 2018-02-18 | Disposition: A | Discharge status: Home / Self Care | Payer: No Typology Code available for payment source | Source: Ambulatory Visit | Attending: Nurse Practitioner | Admitting: Nurse Practitioner

## 2018-02-18 ENCOUNTER — Encounter: Payer: Self-pay | Admitting: Nurse Practitioner

## 2018-02-18 ENCOUNTER — Ambulatory Visit (INDEPENDENT_AMBULATORY_CARE_PROVIDER_SITE_OTHER): Payer: No Typology Code available for payment source | Admitting: Nurse Practitioner

## 2018-02-18 VITALS — BP 98/76 | HR 77 | Temp 98.4°F | Ht 67.0 in | Wt 174.0 lb

## 2018-02-18 DIAGNOSIS — Z01419 Encounter for gynecological examination (general) (routine) without abnormal findings: Secondary | ICD-10-CM

## 2018-02-18 DIAGNOSIS — F419 Anxiety disorder, unspecified: Secondary | ICD-10-CM | POA: Insufficient documentation

## 2018-02-18 NOTE — Patient Instructions (Signed)
Pelvic Exam A pelvic exam is an exam of a woman's outer and inner genitals and reproductive organs. Pelvic exams are done to screen for health problems and to help prevent health problems from developing. You should start having pelvic exams when you turn 27 years old, unless your health care provider recommends having a pelvic exam earlier. Talk with your health care provider about how often you should have a pelvic exam. During your pelvic exam, your health care provider may ask you questions about your health, your family's health, your menstrual periods, immunizations, and your sexual activity. The information shared between you and your health care provider will not be shared with anyone else. What are some reasons to have a pelvic exam? There are many possible reasons for having a pelvic exam. A pelvic exam may be recommended to check for:  Normal development and function of the reproductive organs.  Cancer of the ovaries, uterus, or vagina.  Signs of sexually transmitted infections (STIs) or other types of infections.  Pregnancy. If you are pregnant, a pelvic exam can also help determine how far along you are in your pregnancy.  Widening (dilation) of the cervix during labor.  Injury (trauma) to the reproductive organs.  A pelvic exam may be recommended to help explain or diagnose:  Changes in your body that may be signs of cancer in the reproductive system.  Inability to get pregnant (infertility).  Vaginal itching or burning.  Abnormal vaginal discharge or bleeding.  Problems with sexual function.  Problems with urination, such as: ? Painful urination. ? Frequent urinary tract infections. ? Inability to control when you urinate (urinary incontinence).  Problems with menstrual periods, such as: ? Severe cramping. ? Absence of any menstrual flow in a female by the age of 15 years (primary amenorrhea). ? Stopping of menstrual flow for 3-6 months at a time (secondary  amenorrhea).  Depending on the purpose of your pelvic exam, your health care provider may perform:  A Pap test. This is sometimes called a Pap smear. It is a screening test that is used to check for signs of cancer of the vagina, cervix, and uterus. The test can also identify the presence of infection or precancerous changes.  A cervical biopsy. This is the removal of a small sample of tissue from the cervix. The cervix is the lowest part of the womb (uterus), which opens into the vagina (birth canal). The tissue will be checked under a microscope.  Other diagnostic tests that involve taking samples of tissue or fluid (cultures).  If you have tests done, it is your responsibility to get your test results. Ask your health care provider or the department performing the test when your results will be ready. How is a pelvic exam performed? Usually, a physical exam is done first. This may include:  An exam of your breasts. Your health care provider may feel your breasts to check for abnormalities.  An exam of your abdomen. Your health care provider may press on your abdomen to check for abnormalities.  Pelvic exams may vary among health care providers and hospitals. The following things are usually done during a pelvic exam:  You will remove your clothes from the waist down. You will put on a gown or a wrap to cover yourself while you get ready for the exam.  You will lie on your back on a special table. Your feet will be placed into foot rests (stirrups) so that your legs are wide apart and your knees   are bent. A drape will be placed over your abdomen and your legs.  Your health care provider will examine your outer genitals to check for anything unusual. This includes your clitoris, urethra, vaginal opening, labia, and the skin between your vagina and your anus (perineum).  Your health care provider will examine your inner genitals. To do this, a lubricated instrument (speculum) will be  inserted into your vagina. The speculum will be widened to open the walls of your vagina. ? Your health care provider will examine your vagina and cervix. ? A Pap test, cervical biopsy, or cultures may be done as needed. ? After the internal exam is done, the speculum will be removed.  Your health care provider will put on germ-free (sterile) latex gloves and insert two fingers into your vagina to gently press against various organs. ? Your health care provider may use his or her other hand to gently press on your lower abdomen while doing this.  A pelvic exam is usually painless, although it can cause mild discomfort. If you experience pain at any time during your pelvic exam, tell your health care provider right away. When should I seek medical care? Seek medical care after your pelvic exam if:  You develop new symptoms.  You experience pain or discomfort from anything that was done during your pelvic exam.  This information is not intended to replace advice given to you by your health care provider. Make sure you discuss any questions you have with your health care provider. Document Released: 07/08/2002 Document Revised: 09/01/2015 Document Reviewed: 01/19/2015 Elsevier Interactive Patient Education  2018 [employer] Inc[employer]  

## 2018-02-18 NOTE — Progress Notes (Signed)
   Subjective:  Patient ID: Valerie Graham, female    DOB: 1991/02/05  Age: 27 y.o. MRN: [id number]  CC: Follow-up (pelvic exam)  HPI  she denies any acute complains. Valerie Graham is here for pelvic exam. She was unable to schedule appt with GYN. She declined breast exam today. She performs self breast exam at home.  Reviewed past Medical, Social and Family history today.  Outpatient Medications Prior to Visit  Medication Sig Dispense Refill  . busPIRone (BUSPAR) 15 MG tablet Take 1 tablet (15 mg total) by mouth 2 (two) times daily. 60 tablet 5  . ibuprofen (ADVIL,MOTRIN) 800 MG tablet Take 800 mg by mouth every 8 (eight) hours as needed.    . senna-docusate (SENOKOT-S) 8.6-50 MG tablet Take 1 tablet by mouth at bedtime as needed for moderate constipation. 30 tablet 0  . metroNIDAZOLE (METROGEL) 0.75 % vaginal gel Place 1 Applicatorful vaginally at bedtime. (Patient not taking: Reported on 02/18/2018) 70 g 0  . tranexamic acid (LYSTEDA) 650 MG TABS Take 1,300 mg by mouth.     No facility-administered medications prior to visit.     ROS Review of Systems  Genitourinary: Negative for dysuria, frequency and urgency.       No vaginal discharge     Objective:  BP 98/76   Pulse 77   Temp 98.4 F (36.9 C) (Oral)   Ht 5\' 7"  (1.702 m)   Wt 174 lb (78.9 kg)   LMP 01/21/2018 (Within Weeks)   SpO2 99%   BMI 27.25 kg/m   BP Readings from Last 3 Encounters:  02/18/18 98/76  09/21/17 104/74  09/19/17 106/74    Wt Readings from Last 3 Encounters:  02/18/18 174 lb (78.9 kg)  09/21/17 171 lb 3.2 oz (77.7 kg)  09/19/17 172 lb (78 kg)    Physical Exam  Constitutional: She is oriented to person, place, and time.  Pulmonary/Chest:  Declined breast exam  Abdominal: Soft.  Genitourinary: Uterus normal. Pelvic exam was performed with patient supine. Cervix exhibits discharge. Cervix exhibits no motion tenderness and no friability. Right adnexum displays no tenderness and no fullness.  Left adnexum displays no tenderness and no fullness. No erythema, tenderness or bleeding in the vagina. Vaginal discharge found.  Genitourinary Comments: Chaperone present. Clear discharge per cervical os. White thick discharge in vaginal region  Neurological: She is alert and oriented to person, place, and time.  Vitals reviewed.   Lab Results  Component Value Date   WBC 6.6 09/25/2017   HGB 12.0 09/25/2017   HCT 35.4 (L) 09/25/2017   PLT 216.0 09/25/2017   GLUCOSE 102 (H) 09/21/2017   CHOL 151 09/21/2017   TRIG 102 09/21/2017   HDL 68 09/21/2017   LDLCALC 64 09/21/2017   ALT 9 09/21/2017   AST 14 09/21/2017   NA 137 09/21/2017   K 3.7 09/21/2017   CL 107 09/21/2017   CREATININE 0.83 09/21/2017   BUN 13 09/21/2017   CO2 22 09/21/2017   TSH 1.42 09/21/2017    Assessment & Plan:   Valerie Graham was seen today for follow-up.  Diagnoses and all orders for this visit:  Encounter for cervical Pap smear with pelvic exam -     Cytology - PAP   I am having Valerie Graham maintain her tranexamic acid, ibuprofen, senna-docusate, busPIRone, and metroNIDAZOLE.  No orders of the defined types were placed in this encounter.   Follow-up: Return if symptoms worsen or fail to improve.  Alysia Penna, NP

## 2018-02-19 LAB — CYTOLOGY - PAP
ADEQUACY: ABSENT
BACTERIAL VAGINITIS: NEGATIVE
CANDIDA VAGINITIS: NEGATIVE
CHLAMYDIA, DNA PROBE: NEGATIVE
DIAGNOSIS: NEGATIVE
Neisseria Gonorrhea: NEGATIVE
Trichomonas: NEGATIVE

## 2018-03-07 ENCOUNTER — Ambulatory Visit (INDEPENDENT_AMBULATORY_CARE_PROVIDER_SITE_OTHER): Payer: No Typology Code available for payment source | Admitting: Family Medicine

## 2018-03-07 ENCOUNTER — Telehealth: Payer: Self-pay

## 2018-03-07 ENCOUNTER — Encounter: Payer: Self-pay | Admitting: Family Medicine

## 2018-03-07 VITALS — BP 112/80 | HR 70 | Temp 98.0°F | Ht 67.0 in | Wt 175.0 lb

## 2018-03-07 DIAGNOSIS — S20111A Abrasion of breast, right breast, initial encounter: Secondary | ICD-10-CM | POA: Diagnosis not present

## 2018-03-07 NOTE — Progress Notes (Signed)
Valerie Graham is a 27 y.o. female  Chief Complaint  Patient presents with  . Breast Pain    spot on R breast that has randomly started bleeding and has pain.    HPI: Valerie Graham is a 27 y.o. female complains of area on the lower lateral aspect of her Rt breast that she noticed to be bleeding in the shower this AM. No injury or trauma. She states the area feels sore. No fever, chills. No surrounding redness. Pt states the bleeding was very minimal and stopped after a few seconds.  Past Medical History:  Diagnosis Date  . Anxiety   . Chicken pox   . Depression   . Herpes simplex without mention of complication 07/02/11    Past Surgical History:  Procedure Laterality Date  . TONSILLECTOMY  2014    Social History   Socioeconomic History  . Marital status: Single    Spouse name: Not on file  . Number of children: Not on file  . Years of education: Not on file  . Highest education level: Not on file  Occupational History  . Not on file  Social Needs  . Financial resource strain: Not on file  . Food insecurity:    Worry: Not on file    Inability: Not on file  . Transportation needs:    Medical: Not on file    Non-medical: Not on file  Tobacco Use  . Smoking status: Never Smoker  . Smokeless tobacco: Never Used  Substance and Sexual Activity  . Alcohol use: No  . Drug use: No  . Sexual activity: Yes    Birth control/protection: Condom  Lifestyle  . Physical activity:    Days per week: Not on file    Minutes per session: Not on file  . Stress: Not on file  Relationships  . Social connections:    Talks on phone: Not on file    Gets together: Not on file    Attends religious service: Not on file    Active member of club or organization: Not on file    Attends meetings of clubs or organizations: Not on file    Relationship status: Not on file  . Intimate partner violence:    Fear of current or ex partner: Not on file    Emotionally abused: Not on file   Physically abused: Not on file    Forced sexual activity: Not on file  Other Topics Concern  . Not on file  Social History Narrative  . Not on file    Family History  Problem Relation Age of Onset  . Cancer Paternal Grandfather   . Cancer Maternal Grandmother 60       breast  . Cancer Father        bladder/prostate cancer  . Endometriosis Mother 30       hysterectomy  . Diabetes Maternal Aunt   . Cancer Paternal Uncle 55       prostate  . Cancer Paternal Aunt        stomach cancer  . Cancer Other        colon cancer      There is no immunization history on file for this patient.  Outpatient Encounter Medications as of 03/07/2018  Medication Sig  . ibuprofen (ADVIL,MOTRIN) 800 MG tablet Take 800 mg by mouth every 8 (eight) hours as needed.  . senna-docusate (SENOKOT-S) 8.6-50 MG tablet Take 1 tablet by mouth at bedtime as needed for moderate constipation.  Marland Kitchen  busPIRone (BUSPAR) 15 MG tablet Take 1 tablet (15 mg total) by mouth 2 (two) times daily. (Patient not taking: Reported on 03/07/2018)  . metroNIDAZOLE (METROGEL) 0.75 % vaginal gel Place 1 Applicatorful vaginally at bedtime. (Patient not taking: Reported on 02/18/2018)  . tranexamic acid (LYSTEDA) 650 MG TABS Take 1,300 mg by mouth.   No facility-administered encounter medications on file as of 03/07/2018.      ROS: Gen: no fever, chills  Skin: as above in HPI ENT: no ear pain, ear drainage, nasal congestion, rhinorrhea, sinus pressure, sore throat Resp: no cough, wheeze,SOB CV: no CP, palpitations, LE edema,  GI: no heartburn, n/v/d/c, abd pain GU: no dysuria, urgency, frequency, hematuria  MSK: no joint pain, myalgias, back pain   No Known Allergies  BP 112/80 (BP Location: Left Arm, Patient Position: Sitting, Cuff Size: Normal)   Pulse 70   Temp 98 F (36.7 C) (Oral)   Ht 5\' 7"  (1.702 m)   Wt 175 lb (79.4 kg)   SpO2 99%   BMI 27.41 kg/m   Physical Exam  Constitutional: She appears well-developed  and well-nourished.  Pulmonary/Chest: Right breast exhibits skin change (lateral inferior aspect with very small (less than 0.3 cm) circular superficial abrasion withour surrounding redness, induration, TTP, bleeding or drainage). Right breast exhibits no mass, no nipple discharge and no tenderness.      A/P:  1. Abrasion of right breast, initial encounter - very small superficial abrasion - no bleeding or drainage at this time, non-tender - no sign of infection; no mass/nodule - advised application of triple abx ointment daily to BID x 3-5 days and keep covered to avoid the area rubbing against bra/clothing - f/u if symptoms worsen or area does not heal in 1-2 wks - pt understands and agrees with plan; all questions answered -

## 2018-03-07 NOTE — Patient Instructions (Signed)
Use antibiotic ointment 1-2x/day for 3-4 days and cover with bandaid to prevent area rubbing against clothing

## 2018-03-07 NOTE — Telephone Encounter (Signed)
Spoke with the pt, pt has appt today.

## 2018-03-07 NOTE — Telephone Encounter (Signed)
Copied from CRM [id number]. Topic: General - Other >> Mar 07, 2018 10:32 AM Ronney Lion A wrote: Reason for CRM: Patient would like a call back from the nurse, she did not want to specify.

## 2018-07-28 ENCOUNTER — Encounter (HOSPITAL_COMMUNITY): Payer: Self-pay

## 2018-07-28 ENCOUNTER — Other Ambulatory Visit: Payer: Self-pay

## 2018-07-28 ENCOUNTER — Emergency Department (HOSPITAL_COMMUNITY)
Admission: EM | Admit: 2018-07-28 | Discharge: 2018-07-28 | Disposition: A | Discharge status: Home / Self Care | Payer: No Typology Code available for payment source | Attending: Emergency Medicine | Admitting: Emergency Medicine

## 2018-07-28 DIAGNOSIS — B349 Viral infection, unspecified: Secondary | ICD-10-CM | POA: Diagnosis not present

## 2018-07-28 DIAGNOSIS — R05 Cough: Secondary | ICD-10-CM | POA: Diagnosis present

## 2018-07-28 NOTE — ED Provider Notes (Signed)
[facility]-EMERGENCY DEPT Provider Note   CSN: [id number] Arrival date & time: 07/28/18  3754    History   Chief Complaint No chief complaint on file.   HPI Valerie Graham is a 28 y.o. female.     Patient is a 28 year old female who presents with cough and cold symptoms.  She reports a 2-day history of subjective fever, runny nose congestion and sore throat.  Her cough is mostly nonproductive.  She has some diarrhea but no vomiting.  No nausea.  She has good oral intake.  No known sick contacts.  She denies any chest pain or shortness of breath.  She has been using Tylenol with some improvement of her symptoms.     Past Medical History:  Diagnosis Date  . Anxiety   . Chicken pox   . Depression   . Herpes simplex without mention of complication 07/02/11    Patient Active Problem List   Diagnosis Date Noted  . Anxiety 02/18/2018  . Herpes genitalis 08/31/2011    Past Surgical History:  Procedure Laterality Date  . TONSILLECTOMY  2014     OB History    Gravida  0   Para  0   Term  0   Preterm  0   AB  0   Living  0     SAB  0   TAB  0   Ectopic  0   Multiple  0   Live Births               Home Medications    Prior to Admission medications   Medication Sig Start Date End Date Taking? Authorizing Provider  busPIRone (BUSPAR) 15 MG tablet Take 1 tablet (15 mg total) by mouth 2 (two) times daily. Patient not taking: Reported on 03/07/2018 07/12/17   Nche, Bonna Gains, NP  ibuprofen (ADVIL,MOTRIN) 800 MG tablet Take 800 mg by mouth every 8 (eight) hours as needed.    [provider]  metroNIDAZOLE (METROGEL) 0.75 % vaginal gel Place 1 Applicatorful vaginally at bedtime. Patient not taking: Reported on 02/18/2018 09/21/17   Nche, Bonna Gains, NP  senna-docusate (SENOKOT-S) 8.6-50 MG tablet Take 1 tablet by mouth at bedtime as needed for moderate constipation. 07/12/17   Nche, Bonna Gains, NP  tranexamic acid  (LYSTEDA) 650 MG TABS Take 1,300 mg by mouth.    [provider]    Family History Family History  Problem Relation Age of Onset  . Cancer Paternal Grandfather   . Cancer Maternal Grandmother 59       breast  . Cancer Father        bladder/prostate cancer  . Endometriosis Mother 30       hysterectomy  . Diabetes Maternal Aunt   . Cancer Paternal Uncle 37       prostate  . Cancer Paternal Aunt        stomach cancer  . Cancer Other        colon cancer    Social History Social History   Tobacco Use  . Smoking status: Never Smoker  . Smokeless tobacco: Never Used  Substance Use Topics  . Alcohol use: No  . Drug use: No     Allergies   Patient has no known allergies.   Review of Systems Review of Systems  Constitutional: Positive for fatigue and fever. Negative for chills and diaphoresis.  HENT: Positive for congestion, postnasal drip, rhinorrhea and sore throat. Negative for sneezing.   Eyes:  Negative.   Respiratory: Positive for cough. Negative for chest tightness and shortness of breath.   Cardiovascular: Negative for chest pain and leg swelling.  Gastrointestinal: Positive for diarrhea. Negative for abdominal pain, blood in stool, nausea and vomiting.  Genitourinary: Negative for difficulty urinating, flank pain, frequency and hematuria.  Musculoskeletal: Negative for arthralgias and back pain.  Skin: Negative for rash.  Neurological: Negative for dizziness, speech difficulty, weakness, numbness and headaches.     Physical Exam Updated Vital Signs BP 117/74 (BP Location: Right Arm)   Pulse (!) 105   Temp 98.8 F (37.1 C) (Oral)   Resp 16   Ht 5\' 7"  (1.702 m)   Wt 77.1 kg   SpO2 98%   BMI 26.63 kg/m   Physical Exam Constitutional:      Appearance: She is well-developed.  HENT:     Head: Normocephalic and atraumatic.     Right Ear: Tympanic membrane normal.     Left Ear: Tympanic membrane normal.     Nose: Rhinorrhea present.      Mouth/Throat:     Mouth: Mucous membranes are moist.     Pharynx: No oropharyngeal exudate or posterior oropharyngeal erythema.  Eyes:     Pupils: Pupils are equal, round, and reactive to light.  Neck:     Musculoskeletal: Normal range of motion and neck supple.  Cardiovascular:     Rate and Rhythm: Normal rate and regular rhythm.     Heart sounds: Normal heart sounds.  Pulmonary:     Effort: Pulmonary effort is normal. No respiratory distress.     Breath sounds: Normal breath sounds. No wheezing or rales.  Chest:     Chest wall: No tenderness.  Abdominal:     General: Bowel sounds are normal.     Palpations: Abdomen is soft.     Tenderness: There is no abdominal tenderness. There is no guarding or rebound.  Musculoskeletal: Normal range of motion.  Lymphadenopathy:     Cervical: No cervical adenopathy.  Skin:    General: Skin is warm and dry.     Findings: No rash.  Neurological:     Mental Status: She is alert and oriented to person, place, and time.      ED Treatments / Results  Labs (all labs ordered are listed, but only abnormal results are displayed) Labs Reviewed - No data to display  EKG None  Radiology No results found.  Procedures Procedures (including critical care time)  Medications Ordered in ED Medications - No data to display   Initial Impression / Assessment and Plan / ED Course  I have reviewed the triage vital signs and the nursing notes.  Pertinent labs & imaging results that were available during my care of the patient were reviewed by me and considered in my medical decision making (see chart for details).        Patient is a 29 year old female who presents with viral respiratory symptoms.  She is well-appearing.  She has no hypoxia.  No shortness of breath.  No signs of dehydration.  She was discharged home in good condition.  Symptomatic care instructions were given.  Final Clinical Impressions(s) / ED Diagnoses   Final diagnoses:   Viral syndrome    ED Discharge Orders    None       Rolan Bucco, MD 07/28/18 [phone]

## 2018-07-28 NOTE — ED Notes (Signed)
PT DISCHARGED. INSTRUCTIONS GIVEN. AAOX4. PT IN NO APPARENT DISTRESS OR PAIN. THE OPPORTUNITY TO ASK QUESTIONS WAS PROVIDED. 

## 2018-07-28 NOTE — ED Triage Notes (Addendum)
Pt c/o productive cough with clear mucus, fever, and diarrhea since Friday. Pt denies being in contact with anyone displaying cold-like symptoms, or eating anything out the ordinary. Pt sts she was unable to take her temperature, but was taking liquid tylenol, and when it wears off, she would get really shaky and hot.

## 2018-07-30 ENCOUNTER — Encounter: Payer: Self-pay | Admitting: Nurse Practitioner

## 2018-08-20 MED FILL — CHLORHEXIDINE 0.12% RINSE: 0.12 | 15 days supply | Qty: 473 | Fill #0

## 2018-08-23 MED FILL — METHYLPREDNISOLONE 4 MG TBP: 4 | 6 days supply | Qty: 21 | Fill #0

## 2018-08-23 MED FILL — PENICILLIN VK 500 MG TABLET: 500 | 14 days supply | Qty: 56 | Fill #0

## 2018-08-23 MED FILL — HYDROCODON-APAP 5-325: 5-325 | 5 days supply | Qty: 20 | Fill #0

## 2018-08-23 MED FILL — CLINDAMYCIN HCL 300 MG CAPS: 300 | 14 days supply | Qty: 56 | Fill #0

## 2018-09-11 ENCOUNTER — Telehealth: Payer: Self-pay | Admitting: Nurse Practitioner

## 2018-09-11 NOTE — Telephone Encounter (Signed)
Patient called in to schedule CPE with [facility]. Patient is wanting to schedule this physical on 11/15/2018 at 9:15am. I will call patient back to verify this date and time.

## 2018-11-25 MED FILL — LETROZOLE 2.5 MG TABLET: 2.5 | 5 days supply | Qty: 10 | Fill #0

## 2018-11-25 MED FILL — DOXYCYCLINE HYCLATE 100 MG: 100 | 1 days supply | Qty: 2 | Fill #0

## 2018-11-26 ENCOUNTER — Encounter: Payer: Self-pay | Admitting: Nurse Practitioner

## 2018-12-04 ENCOUNTER — Encounter: Payer: No Typology Code available for payment source | Admitting: Nurse Practitioner

## 2018-12-06 ENCOUNTER — Encounter: Payer: Self-pay | Admitting: Nurse Practitioner

## 2018-12-06 ENCOUNTER — Other Ambulatory Visit: Payer: Self-pay

## 2018-12-06 ENCOUNTER — Ambulatory Visit (INDEPENDENT_AMBULATORY_CARE_PROVIDER_SITE_OTHER): Payer: No Typology Code available for payment source | Admitting: Nurse Practitioner

## 2018-12-06 VITALS — BP 106/64 | HR 75 | Temp 98.6°F | Ht 67.09 in | Wt 168.2 lb

## 2018-12-06 DIAGNOSIS — Z7251 High risk heterosexual behavior: Secondary | ICD-10-CM | POA: Diagnosis not present

## 2018-12-06 DIAGNOSIS — F4323 Adjustment disorder with mixed anxiety and depressed mood: Secondary | ICD-10-CM

## 2018-12-06 DIAGNOSIS — Z0001 Encounter for general adult medical examination with abnormal findings: Secondary | ICD-10-CM | POA: Diagnosis not present

## 2018-12-06 DIAGNOSIS — Z13220 Encounter for screening for lipoid disorders: Secondary | ICD-10-CM | POA: Diagnosis not present

## 2018-12-06 DIAGNOSIS — Z136 Encounter for screening for cardiovascular disorders: Secondary | ICD-10-CM | POA: Diagnosis not present

## 2018-12-06 LAB — POCT URINE PREGNANCY: Preg Test, Ur: NEGATIVE

## 2018-12-06 MED ORDER — ESCITALOPRAM OXALATE 10 MG PO TABS: 10.0000 mg | ORAL_TABLET | Freq: Every day | ORAL | 1 refills | Status: DC

## 2018-12-06 NOTE — Patient Instructions (Addendum)
I instructed pt to start 1/2 tablet once daily for 1 week and then increase to a full tablet once daily on week two as tolerated.   We discussed common side effects such as nausea, drowsiness and weight gain.  Also discussed rare but serious side effect of suicide ideation.  She is instructed to discontinue medication go directly to ED if this occurs.  Pt verbalizes understanding.   Plan follow up in 1 month to evaluate progress.    Contact employee assistance program for therapist or let me know if you want referral to psychology entered.  Go to lab for urine pregnancy today. Return to lab fasting for blood draw.  Supporting Someone With Depression Depression is a mental health condition that affects the way a person feels, thinks, and handles daily activities such as eating, sleeping, and working. When a person has depression, his or her condition can affect others around him or her, such as friends and family members. Friends and family can help by offering support and understanding. What do I need to know about this condition? The main symptoms of depression are:  Constant depressed or irritable mood.  Loss of interest in things and activities that were enjoyed in the past. Other symptoms of depression include:  Fatigue.  Sleeping too much or too little.  Difficulty falling asleep, or waking up early and not being able to get back to sleep.  Difficulty concentrating or making decisions.  Changes in appetite and weight.  Staying away from others (isolating oneself).  Expressing feelings of guilt.  Expressing suicidal thoughts or feelings. What do I need to know about the treatment options? This condition is usually treated by mental health providers such as psychologists, psychiatrists, and clinical social workers. Treatment may include one or more of the following:  Psychotherapy, also called talk therapy or counseling. Types of psychotherapy include individual or group  therapy, and they usually involve the following approaches: ? Cognitive behavioral therapy (CBT). This type of therapy teaches a person how to recognize feelings, thoughts, and behaviors that contribute to depression. The person is taught to make a choice about how to respond to these feelings, thoughts, and behaviors so that he or she can experience fewer symptoms. ? Interpersonal therapy (IPT). This helps to improve the way that someone with depression relates to and communicates with others. This type of therapy may involve caregivers and loved ones. ? Family therapy. This treatment helps family members to communicate and deal with conflict in healthy ways.  Medicine. This is often used to help with certain emotions and behaviors. Combining medicine and therapy is often the most effective approach. The following lifestyle changes may also help with managing symptoms of depression:  Limiting alcohol and drug use.  Exercising regularly.  Getting enough good-quality sleep.  Making healthy eating choices.  Reducing distressing situations.  Spending time outside.  Following regular daily routines. How can I support my loved one? Talk about the condition Good communication is the key to supporting your friend or family member. Here are a few things to keep in mind:  Ask your loved one how you can support him or her.  Respect your loved one's right to make decisions.  Be careful about too much prodding. Try not to overdo reminders to an adult friend or family member about things like taking medicines. Ask how your loved one prefers that you help.  Be encouraging and offer emotional support. This can help to lower stress. Even saying something simple to  comfort your loved one may help.  Never ignore comments about suicide, and do not try to avoid the subject of suicide. Talking about suicide will not make your loved one want to act on it. You or your loved one can reach out 24 hours a day  to get free, private support (on the phone or a live online chat) from a suicide crisis helpline, such as the [place] at 234 828 2169.  Listening is very important. Be available if your friend or family member wants to talk. Make an effort to acknowledge his or her feelings and stay calm and realistic. Find support and resources A health care provider may be able to recommend mental health resources that are available online or over the phone. You could start with:  Government sites such as the Substance Abuse and [place] (SAMHSA): ktimeonline.com  National mental health organizations such as the [employer] [address] ([place]): www.nami.org You may also consider:  Joining self-help and support groups, not only for your friend or family member, but also for yourself. People in these peer and family support groups understand what you and your loved one are going through. They can help you feel a sense of hope and connect you with local resources to help you learn more.  Attending family therapy with your loved one. General support  Make an effort to learn all you can about depression.  Help your loved one follow his or her treatment plan as directed by health care providers. This could mean driving him or her to therapy sessions or suggesting ways to cope with stress.  Ask your loved one if you may join him or her for a therapy session or go with him or her to health care visits. Joining your loved one with his or her permission can give you an opportunity to learn how to be more supportive.  Include your loved one in activities. Invite her or him to go for walks and outings. At first, your loved one may not want to, but keep trying.  Be patient and do not expect your loved one to do too much too soon.  Help with daily responsibilities, such as laundry or meals. Sometimes daily tasks seem overwhelming to a person with  depression.  Remember that your support really matters. Social support is a huge benefit for someone who is coping with depression. How can I create a safe environment? If your loved one feels unable to control his or her behavior, it may be necessary to take steps to keep his or her home safe. Such steps may include:  Locking up alcohol and prescription pills that your loved one may turn to. Count prescription pills often. You may want to consider removing alcohol from the home.  Removing or locking up guns and other weapons. If you do not have a safe place to keep a gun, local law enforcement may store a gun for you.  Making a written crisis plan. Include important phone numbers, such as the local crisis intervention team. Make sure that: ? The person with depression knows about this plan. ? Everyone who has regular contact with that person knows about the plan and knows what to do in an emergency. How should I care for myself? It is important to find ways to care for your body, mind, and well-being while supporting someone with depression.  Spend time with friends and family. Find someone you can talk to who will also help you  work on using coping skills to manage stress. Consider seeking therapy for yourself.  Try to maintain your normal routines. This can help you remember that your life is about more than your loved one's condition.  Understand what your limits are. Say "no" to requests or events that lead to a schedule that is too busy.  Make time for activities that help you relax, and try to not feel guilty about taking time for yourself.  Consider trying meditation and deep breathing exercises to lower stress.  Get plenty of sleep.  Exercise, even if it is just taking a short walk a few times a week. What are some signs that the condition is getting worse? Signs that your loved one's condition may be getting worse include:  Symptoms returning or getting worse.  Not taking  medicines or attending therapy as prescribed.  Having more trouble sleeping or doing everyday activities.  Withdrawal from friends and family. Get help right away if:  Your loved one expresses serious thoughts about self-harm or about hurting others.  Your loved one sees, hears, tastes, smells, or feels things that are not present (hallucinations). If you ever feel like your loved one may hurt himself or herself or others, or may have thoughts about taking his or her own life, get help right away. You can go to your nearest emergency department or call:  Your local emergency services (911 in the [place]  A suicide crisis helpline, such as the National Suicide Prevention Lifeline at 912-677-91101-5404223345. This is open 24 hours a day. Summary  Depression is a mood disorder that affects the way a person feels, thinks, and handles daily activities.  Depression is usually treated by mental health professionals. It may include psychotherapy, medicine, lifestyle changes, or a combination of these approaches.  When you support a loved one with depression, it is important to keep yourself healthy and safe.  Get help right away if your loved one expresses serious thoughts about self-harm. This information is not intended to replace advice given to you by your health care provider. Make sure you discuss any questions you have with your health care provider. Document Released: 08/29/2016 Document Revised: 08/08/2018 Document Reviewed: 08/29/2016 Elsevier Patient Education  2020 [employer]

## 2018-12-06 NOTE — Progress Notes (Signed)
Subjective:    Patient ID: Valerie Graham, female    DOB: 1990/12/06, 28 y.o.   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 Discontinued buspar due to headache and dizziness Depression screen [facility] 2/9 12/06/2018 12/06/2018 07/12/2017  Decreased Interest 1 0 0  Down, Depressed, Hopeless 1 0 0  PHQ - 2 Score 2 0 0  Altered sleeping 2 - -  Tired, decreased energy 1 - -  Change in appetite 0 - -  Feeling bad or failure about yourself  1 - -  Trouble concentrating 3 - -  Moving slowly or fidgety/restless 0 - -  Suicidal thoughts 0 - -  PHQ-9 Score 9 - -   GAD 7 : Generalized Anxiety Score 12/06/2018  Nervous, Anxious, on Edge 2  Control/stop worrying 2  Worry too much - different things 1  Trouble relaxing 1  Restless 1  Easily annoyed or irritable 2  Afraid - awful might happen 1  Total GAD 7 Score 10    Vision:up to date  Dental:up to date  Immunizations: (TDAP, Hep C screen, Pneumovax, Influenza, zoster)  Health Maintenance  Topic Date Due  . HIV Screening  05/25/2005  . Flu Shot  11/30/2018  . PAP-Cervical Cytology Screening  02/18/2021  . Pap Smear  02/18/2021  . Tetanus Vaccine  01/30/2027   Diet:regular  Weight:  Wt Readings from Last 3 Encounters:  12/06/18 168 lb 3.2 oz (76.3 kg)  07/28/18 170 lb (77.1 kg)  03/07/18 175 lb (79.4 kg)    Exercise:walking  Fall Risk: Fall Risk  12/06/2018 07/12/2017  Falls in the past year? 1 No  Number falls in past yr: 0 -  Comment accident -  Injury with Fall? 0 -  Comment sore -   Advanced Directive: Advanced Directives 07/28/2018  Does Patient Have a Medical Advance Directive? No  Would patient like information on creating a medical advance directive?  No - Patient declined     Medications and allergies reviewed with patient and updated if appropriate.  Patient Active Problem List   Diagnosis Date Noted  . Anxiety 02/18/2018    Current Outpatient Medications on File Prior to Visit  Medication Sig Dispense Refill  . senna-docusate (SENOKOT-S) 8.6-50 MG tablet Take 1 tablet by mouth at bedtime as needed for moderate constipation. 30 tablet 0  . tranexamic acid (LYSTEDA) 650 MG TABS Take 1,300 mg by mouth.     No current facility-administered medications on file prior to visit.     Past Medical History:  Diagnosis Date  . Anxiety   . Chicken pox   . Depression   . Herpes simplex without mention of complication 12/02/07    Past Surgical History:  Procedure Laterality Date  . TONSILLECTOMY  2014  . WISDOM TOOTH EXTRACTION      Social History   Socioeconomic History  . Marital status: Single    Spouse name: Not on file  . Number of children: Not on file  . Years of education: Not on file  . Highest education level: Not on file  Occupational History  . Not on file  Social Needs  . Financial resource strain: Not on file  . Food insecurity    Worry: Not on file    Inability: Not on file  .  Transportation needs    Medical: Not on file    Non-medical: Not on file  Tobacco Use  . Smoking status: Never Smoker  . Smokeless tobacco: Never Used  Substance and Sexual Activity  . Alcohol use: No  . Drug use: No  . Sexual activity: Yes    Birth control/protection: None  Lifestyle  . Physical activity    Days per week: Not on file    Minutes per session: Not on file  . Stress: Not on file  Relationships  . Social Herbalist on phone: Not on file    Gets together: Not on file    Attends religious service: Not on file    Active member of club or organization: Not on file    Attends meetings of clubs or organizations: Not on file    Relationship status: Not on file  Other Topics Concern  . Not on file   Social History Narrative  . Not on file    Family History  Problem Relation Age of Onset  . Cancer Paternal Grandfather   . Cancer Maternal Grandmother 40       breast  . Cancer Father 8       bladder/prostate cancer met to bone  . Endometriosis Mother 4       hysterectomy  . Diabetes Maternal Aunt   . Cancer Paternal Uncle 97       prostate  . Stroke Paternal Uncle   . Cancer Paternal Aunt        stomach cancer  . Cancer Other        colon cancer        Review of Systems  Constitutional: Negative for fever, malaise/fatigue and weight loss.  HENT: Negative for congestion and sore throat.   Eyes:       Negative for visual changes  Respiratory: Negative for cough and shortness of breath.   Cardiovascular: Negative for chest pain, palpitations and leg swelling.  Gastrointestinal: Negative for blood in stool, constipation, diarrhea and heartburn.  Genitourinary: Negative for dysuria, frequency and urgency.  Musculoskeletal: Negative for falls, joint pain and myalgias.  Skin: Negative for rash.  Neurological: Negative for dizziness, sensory change and headaches.  Endo/Heme/Allergies: Does not bruise/bleed easily.  Psychiatric/Behavioral: Negative for depression, substance abuse and suicidal ideas. The patient is not nervous/anxious.     Objective:   Vitals:   12/06/18 1410  BP: 106/64  Pulse: 75  Temp: 98.6 F (37 C)  SpO2: 99%    Body mass index is 26.28 kg/m.   Physical Examination:  Physical Exam Vitals signs reviewed.  Constitutional:      General: She is not in acute distress.    Appearance: She is well-developed.  HENT:     Head: Normocephalic.     Right Ear: Tympanic membrane, ear canal and external ear normal.     Left Ear: Tympanic membrane, ear canal and external ear normal.     Mouth/Throat:     Pharynx: No oropharyngeal exudate.  Eyes:     Extraocular Movements: Extraocular movements intact.     Conjunctiva/sclera: Conjunctivae normal.   Neck:     Musculoskeletal: Normal range of motion and neck supple.  Cardiovascular:     Rate and Rhythm: Normal rate and regular rhythm.     Heart sounds: Normal heart sounds.  Pulmonary:     Effort: Pulmonary effort is normal. No respiratory distress.     Breath sounds: Normal breath sounds.  Chest:  Chest wall: No tenderness.  Abdominal:     General: Bowel sounds are normal.     Palpations: Abdomen is soft.  Genitourinary:    Comments: Declined pelvic and breast exam Musculoskeletal: Normal range of motion.  Lymphadenopathy:     Cervical: No cervical adenopathy.  Skin:    General: Skin is warm and dry.  Neurological:     Mental Status: She is alert and oriented to person, place, and time.     Deep Tendon Reflexes: Reflexes are normal and symmetric.  Psychiatric:        Attention and Perception: Attention normal.        Mood and Affect: Mood is anxious.        Speech: Speech normal.        Behavior: Behavior is cooperative.        Thought Content: Thought content normal.        Cognition and Memory: Cognition normal.    ASSESSMENT and PLAN:  Jentri was seen today for annual exam.  Diagnoses and all orders for this visit:  Encounter for preventative adult health care exam with abnormal findings -     CBC; Future -     Comprehensive metabolic panel; Future -     TSH; Future -     Lipid panel; Future  Adjustment disorder with mixed anxiety and depressed mood -     escitalopram (LEXAPRO) 10 MG tablet; Take 1 tablet (10 mg total) by mouth at bedtime.  Encounter for lipid screening for cardiovascular disease -     Lipid panel; Future  Unprotected sexual intercourse -     POCT urine pregnancy   No problem-specific Assessment & Plan notes found for this encounter.     Problem List Items Addressed This Visit      Other   Anxiety   Relevant Medications   escitalopram (LEXAPRO) 10 MG tablet    Other Visit Diagnoses    Encounter for preventative adult  health care exam with abnormal findings    -  Primary   Relevant Orders   CBC   Comprehensive metabolic panel   TSH   Lipid panel   Encounter for lipid screening for cardiovascular disease       Relevant Orders   Lipid panel   Unprotected sexual intercourse       Relevant Orders   POCT urine pregnancy (Completed)       Follow up: Return in about 4 weeks (around 01/03/2019) for anxiety and depression (28mns).  CWilfred Lacy NP

## 2018-12-09 ENCOUNTER — Telehealth: Payer: Self-pay

## 2018-12-09 NOTE — Telephone Encounter (Signed)

## 2018-12-10 ENCOUNTER — Other Ambulatory Visit (INDEPENDENT_AMBULATORY_CARE_PROVIDER_SITE_OTHER): Payer: No Typology Code available for payment source

## 2018-12-10 DIAGNOSIS — Z136 Encounter for screening for cardiovascular disorders: Secondary | ICD-10-CM

## 2018-12-10 DIAGNOSIS — Z13220 Encounter for screening for lipoid disorders: Secondary | ICD-10-CM

## 2018-12-10 DIAGNOSIS — Z0001 Encounter for general adult medical examination with abnormal findings: Secondary | ICD-10-CM | POA: Diagnosis not present

## 2018-12-10 LAB — LIPID PANEL
Cholesterol: 157 mg/dL (ref 0–200)
HDL: 42.8 mg/dL (ref >39.00)
LDL Cholesterol: 100 mg/dL — ABNORMAL HIGH (ref 0–99)
NonHDL: 114.4
Total CHOL/HDL Ratio: 4
Triglycerides: 71 mg/dL (ref 0.0–149.0)
VLDL: 14.2 mg/dL (ref 0.0–40.0)

## 2018-12-10 LAB — CBC
HCT: 35.6 % — ABNORMAL LOW (ref 36.0–46.0)
Hemoglobin: 11.9 g/dL — ABNORMAL LOW (ref 12.0–15.0)
MCHC: 33.5 g/dL (ref 30.0–36.0)
MCV: 90.3 fl (ref 78.0–100.0)
Platelets: 231 10*3/uL (ref 150.0–400.0)
RBC: 3.94 Mil/uL (ref 3.87–5.11)
RDW: 13.6 % (ref 11.5–15.5)
WBC: 7 10*3/uL (ref 4.0–10.5)

## 2018-12-10 LAB — COMPREHENSIVE METABOLIC PANEL
ALT: 19 U/L (ref 0–35)
AST: 25 U/L (ref 0–37)
Albumin: 4.3 g/dL (ref 3.5–5.2)
Alkaline Phosphatase: 65 U/L (ref 39–117)
BUN: 10 mg/dL (ref 6–23)
CO2: 26 mEq/L (ref 19–32)
Calcium: 9.2 mg/dL (ref 8.4–10.5)
Chloride: 106 mEq/L (ref 96–112)
Creatinine, Ser: 0.67 mg/dL (ref 0.40–1.20)
GFR: 104.4 mL/min (ref >60.00)
Glucose, Bld: 86 mg/dL (ref 70–99)
Potassium: 3.9 mEq/L (ref 3.5–5.1)
Sodium: 138 mEq/L (ref 135–145)
Total Bilirubin: 0.2 mg/dL (ref 0.2–1.2)
Total Protein: 6.9 g/dL (ref 6.0–8.3)

## 2018-12-10 LAB — TSH: TSH: 2.35 u[IU]/mL (ref 0.35–4.50)

## 2018-12-10 NOTE — Addendum Note (Signed)
Addended by: Lynnea Ferrier on: 12/10/2018 08:12 AM   Modules accepted: Orders

## 2019-01-04 MED FILL — LETROZOLE 2.5 MG TABLET: 2.5 | 5 days supply | Qty: 10 | Fill #1

## 2019-02-28 ENCOUNTER — Ambulatory Visit (INDEPENDENT_AMBULATORY_CARE_PROVIDER_SITE_OTHER): Payer: No Typology Code available for payment source | Admitting: Nurse Practitioner

## 2019-02-28 ENCOUNTER — Other Ambulatory Visit: Payer: Self-pay

## 2019-02-28 ENCOUNTER — Encounter: Payer: Self-pay | Admitting: Nurse Practitioner

## 2019-02-28 VITALS — BP 112/76 | HR 51 | Temp 98.4°F | Ht 67.09 in | Wt 169.8 lb

## 2019-02-28 DIAGNOSIS — G44019 Episodic cluster headache, not intractable: Secondary | ICD-10-CM

## 2019-02-28 MED ORDER — NAPROXEN 500 MG PO TABS: 500.0000 mg | ORAL_TABLET | Freq: Two times a day (BID) | ORAL | 0 refills | Status: DC

## 2019-02-28 NOTE — Progress Notes (Signed)
Subjective:  Patient ID: Valerie Graham, female    DOB: 26-Dec-1990  Age: 28 y.o. MRN: [id number]  CC: Headache (pt c/o of right side face tingling,pressure top of eye brown and pressure behind eye/2 wks/)  Headache  This is a new problem. The current episode started 1 to 4 weeks ago. The problem occurs intermittently. The problem has been waxing and waning. The pain is located in the right unilateral and retro-orbital region. The pain does not radiate. The pain quality is not similar to prior headaches. The quality of the pain is described as aching and sharp. The pain is moderate. Associated symptoms include eye pain, rhinorrhea and tinnitus. Pertinent negatives include no abnormal behavior, anorexia, back pain, blurred vision, dizziness, drainage, ear pain, eye redness, eye watering, facial sweating, fever, hearing loss, insomnia, loss of balance, muscle aches, nausea, neck pain, numbness, phonophobia, photophobia, scalp tenderness, seizures, sinus pressure, sore throat, swollen glands, tingling, visual change, vomiting, weakness or weight loss. The symptoms are aggravated by unknown. She has tried nothing for the symptoms. There is no history of cancer, cluster headaches, hypertension, immunosuppression, migraine headaches, migraines in the family, obesity, pseudotumor cerebri, recent head traumas, sinus disease or TMJ.   Reviewed past Medical, Social and Family history today.  Outpatient Medications Prior to Visit  Medication Sig Dispense Refill  . escitalopram (LEXAPRO) 10 MG tablet Take 1 tablet (10 mg total) by mouth at bedtime. (Patient not taking: Reported on 02/28/2019) 30 tablet 1  . senna-docusate (SENOKOT-S) 8.6-50 MG tablet Take 1 tablet by mouth at bedtime as needed for moderate constipation. (Patient not taking: Reported on 02/28/2019) 30 tablet 0  . tranexamic acid (LYSTEDA) 650 MG TABS Take 1,300 mg by mouth.     No facility-administered medications prior to visit.     ROS See  HPI  Objective:  BP 112/76   Pulse (!) 51   Temp 98.4 F (36.9 C) (Tympanic)   Ht 5' 7.09" (1.704 m)   Wt 169 lb 12.8 oz (77 kg)   SpO2 99%   BMI 26.52 kg/m   BP Readings from Last 3 Encounters:  02/28/19 112/76  12/06/18 106/64  07/28/18 117/74    Wt Readings from Last 3 Encounters:  02/28/19 169 lb 12.8 oz (77 kg)  12/06/18 168 lb 3.2 oz (76.3 kg)  07/28/18 170 lb (77.1 kg)    Physical Exam Vitals signs reviewed.  HENT:     Head: Normocephalic.     Mouth/Throat:     Mouth: Mucous membranes are moist.     Dentition: Normal dentition. No dental tenderness or gingival swelling.     Tongue: No lesions. Tongue does not deviate from midline.     Pharynx: Oropharynx is clear. Uvula midline.     Tonsils: No tonsillar exudate.  Eyes:     General: No visual field deficit or scleral icterus.    Extraocular Movements: Extraocular movements intact.     Pupils: Pupils are equal, round, and reactive to light.  Neck:     Musculoskeletal: Normal range of motion and neck supple.  Cardiovascular:     Rate and Rhythm: Normal rate.  Pulmonary:     Effort: Pulmonary effort is normal.  Lymphadenopathy:     Cervical: No cervical adenopathy.  Skin:    General: Skin is warm and dry.     Findings: No erythema or rash.  Neurological:     Mental Status: She is alert and oriented to person, place, and time.  Cranial Nerves: No dysarthria or facial asymmetry.     Coordination: Coordination normal.     Gait: Gait normal.  Psychiatric:        Mood and Affect: Mood normal.        Speech: Speech normal.        Behavior: Behavior normal.    Lab Results  Component Value Date   WBC 7.0 12/10/2018   HGB 11.9 (L) 12/10/2018   HCT 35.6 (L) 12/10/2018   PLT 231.0 12/10/2018   GLUCOSE 86 12/10/2018   CHOL 157 12/10/2018   TRIG 71.0 12/10/2018   HDL 42.80 12/10/2018   LDLCALC 100 (H) 12/10/2018   ALT 19 12/10/2018   AST 25 12/10/2018   NA 138 12/10/2018   K 3.9 12/10/2018   CL  106 12/10/2018   CREATININE 0.67 12/10/2018   BUN 10 12/10/2018   CO2 26 12/10/2018   TSH 2.35 12/10/2018    Assessment & Plan:   Nani was seen today for headache.  Diagnoses and all orders for this visit:  Episodic cluster headache, not intractable -     naproxen (NAPROSYN) 500 MG tablet; Take 1 tablet (500 mg total) by mouth 2 (two) times daily with a meal.   I am having Laurence Aly start on naproxen. I am also having her maintain her tranexamic acid, senna-docusate, and escitalopram.  Meds ordered this encounter  Medications  . naproxen (NAPROSYN) 500 MG tablet    Sig: Take 1 tablet (500 mg total) by mouth 2 (two) times daily with a meal.    Dispense:  10 tablet    Refill:  0    Order Specific Question:   Supervising Provider    Answer:   MATTHEWS, CODY [[id number]]    Problem List Items Addressed This Visit    None    Visit Diagnoses    Episodic cluster headache, not intractable    -  Primary   Relevant Medications   naproxen (NAPROSYN) 500 MG tablet       Follow-up: Return if symptoms worsen or fail to improve.  Alysia Penna, NP

## 2019-02-28 NOTE — Patient Instructions (Addendum)
If no improvement with naproxen, let me know. I can send imitrex rx.  Cluster Headache Cluster headaches are deeply painful. They normally occur on one side of your head, but they may switch sides. Often, cluster headaches:  Are severe.  Happen often for a few weeks or months and then go away for a while.  Last from 15 minutes to 3 hours.  Happen at the same time each day.  Happen at night.  Happen many times a day. Follow these instructions at home:        Follow instructions from your doctor to care for yourself at home:  Go to bed at the same time each night. Get the same amount of sleep every night.  Avoid alcohol.  Stop smoking if you smoke. This includes cigarettes and e-cigarettes.  Take over-the-counter and prescription medicines only as told by your doctor.  Do not drive or use heavy machinery while taking prescription pain medicine.  Use oxygen as told by your doctor.  Exercise regularly.  Eat a healthy diet.  Write down when each headache happened, what kind of pain you had, how bad your pain was, and what you tried to help your pain. This is called a headache diary. Use it as told by your doctor. Contact a doctor if:  Your headaches get worse or they happen more often.  Your medicines are not helping. Get help right away if:  You pass out (faint).  You get weak or lose feeling (have numbness) on one side of your body or face.  You see two of everything (double vision).  You feel sick to your stomach (nauseous) or you throw up (vomit), and you do not stop after many hours.  You have trouble with your balance or with walking.  You have trouble talking.  You have neck pain or stiffness.  You have a fever. This information is not intended to replace advice given to you by your health care provider. Make sure you discuss any questions you have with your health care provider. Document Released: 05/25/2004 Document Revised: 07/13/2017 Document  Reviewed: 12/24/2015 Elsevier Patient Education  2020 [employer].

## 2019-03-01 MED FILL — NAPROXEN 500 MG TABS: 500 | 5 days supply | Qty: 10 | Fill #0

## 2019-05-04 ENCOUNTER — Encounter: Payer: Self-pay | Admitting: Nurse Practitioner

## 2019-05-30 ENCOUNTER — Encounter: Payer: Self-pay | Admitting: Nurse Practitioner

## 2019-05-30 DIAGNOSIS — Z809 Family history of malignant neoplasm, unspecified: Secondary | ICD-10-CM

## 2019-05-30 NOTE — Telephone Encounter (Signed)
Please see message and advise.  Thank you. ° °

## 2019-06-03 ENCOUNTER — Telehealth: Payer: Self-pay | Admitting: Licensed Clinical Social Worker

## 2019-06-03 NOTE — Telephone Encounter (Signed)
Received a genetic counseling referral from Alysia Penna, NP for fhx of cancer. Pt has been cld and scheduled for a mychart video visit on 2/15 at 3pm. I verified the pt has an active mychart visit.

## 2019-06-16 ENCOUNTER — Encounter: Payer: Self-pay | Admitting: Licensed Clinical Social Worker

## 2019-06-16 ENCOUNTER — Inpatient Hospital Stay
Payer: No Typology Code available for payment source | Attending: Licensed Clinical Social Worker | Admitting: Licensed Clinical Social Worker

## 2019-06-16 DIAGNOSIS — Z808 Family history of malignant neoplasm of other organs or systems: Secondary | ICD-10-CM | POA: Diagnosis not present

## 2019-06-16 DIAGNOSIS — Z8042 Family history of malignant neoplasm of prostate: Secondary | ICD-10-CM

## 2019-06-16 DIAGNOSIS — Z8041 Family history of malignant neoplasm of ovary: Secondary | ICD-10-CM | POA: Diagnosis not present

## 2019-06-16 NOTE — Progress Notes (Signed)
REFERRING PROVIDER: Flossie Buffy, NP [place],  [place] [id number]  PRIMARY PROVIDER:  Nche, Charlene Brooke, NP  PRIMARY REASON FOR VISIT:  1. Family history of prostate cancer   2. Family history of ovarian cancer   3. Family history of skin cancer     I connected with Valerie Graham on 06/16/2019 at 3:00 PM EDT by MyChart video conference and verified that I am speaking with the correct person using two identifiers.    Patient location: home Provider location: clinic  HISTORY OF PRESENT ILLNESS:   Valerie Graham, a 29 y.o. female, was seen for a [place] cancer genetics consultation at the request of Dr. Lorayne Marek due to a family history of cancer.  Valerie Graham presents to clinic today to discuss the possibility of a hereditary predisposition to cancer, genetic testing, and to further clarify her future cancer risks, as well as potential cancer risks for family members.   Valerie Graham is a 29 y.o. female with no personal history of cancer.    CANCER HISTORY:  Oncology History   No history exists.     RISK FACTORS:  Menarche was at age 70.  First live birth at age no children.  OCP use for approximately 1 years.  Ovaries intact: yes.  Hysterectomy: no.  Menopausal status: premenopausal.  HRT use: 0 years. Colonoscopy: no; not examined. Mammogram within the last year: no. Number of breast biopsies: 0. Up to date with pelvic exams: yes. Any excessive radiation exposure in the past: no  Past Medical History:  Diagnosis Date  . Anxiety   . Chicken pox   . Depression   . Family history of ovarian cancer   . Family history of prostate cancer   . Family history of skin cancer   . Herpes simplex without mention of complication 06/09/54    Past Surgical History:  Procedure Laterality Date  . TONSILLECTOMY  2014  . WISDOM TOOTH EXTRACTION      Social History   Socioeconomic History  . Marital status: Single    Spouse name: Not on file  . Number of  children: Not on file  . Years of education: Not on file  . Highest education level: Not on file  Occupational History  . Not on file  Tobacco Use  . Smoking status: Never Smoker  . Smokeless tobacco: Never Used  Substance and Sexual Activity  . Alcohol use: No  . Drug use: No  . Sexual activity: Yes    Birth control/protection: None  Other Topics Concern  . Not on file  Social History Narrative  . Not on file   Social Determinants of Health   Financial Resource Strain:   . Difficulty of Paying Living Expenses: Not on file  Food Insecurity:   . Worried About Charity fundraiser in the Last Year: Not on file  . Ran Out of Food in the Last Year: Not on file  Transportation Needs:   . Lack of Transportation (Medical): Not on file  . Lack of Transportation (Non-Medical): Not on file  Physical Activity:   . Days of Exercise per Week: Not on file  . Minutes of Exercise per Session: Not on file  Stress:   . Feeling of Stress : Not on file  Social Connections:   . Frequency of Communication with Friends and Family: Not on file  . Frequency of Social Gatherings with Friends and Family: Not on file  . Attends Religious Services: Not on  file  . Active Member of Clubs or Organizations: Not on file  . Attends Archivist Meetings: Not on file  . Marital Status: Not on file     FAMILY HISTORY:  We obtained a detailed, 4-generation family history.  Significant diagnoses are listed below: Family History  Problem Relation Age of Onset  . Cancer Paternal Grandfather   . Cancer Maternal Grandmother 29       breast  . Cancer Father 21       bladder/prostate metastatic, neg genetic testing 2019  . Endometriosis Mother 22       hysterectomy  . Diabetes Maternal Aunt   . Cancer Paternal Uncle 36       prostate  . Stroke Paternal Uncle   . Cancer Paternal Aunt        ovarian  . Cancer Other        colon cancer   Valerie Graham does not have children. She has 1 paternal half  sister living at 52 with no history of cancer.  Valerie Graham father recently passed away due to metastatic bladder/prostate cancer. He was diagnosed with this at 37 and passed at 59. He had genetic testing in 2019 [facility] Multi-Cancer Panel, saw Roma Kayser, MS, [facility]), report available for review during session. Patient has 2 paternal aunts and 3 paternal uncles. Another uncle had prostate cancer in his 50s and skin cancer, and is living in his 32s. An aunt had ovarian cancer in her 18s and died in her 24s. Her two other uncles have also had skin cancers removed. Paternal grandmother died of an aneurysm in her 41s. Paternal grandfather died in his 24s and she reports he possibly had prostate cancer. Her grandfather's mother had metastatic cancer, unknown type.   Valerie Graham mother is living at 59 with no history of cancer. Patient has 3 maternal aunts and 1 maternal uncle, no cancers. No cancers in maternal cousins. Maternal grandmother is living at 30. Maternal grandfather passed due to a heart issue, unsure age of death. His mother (patient's great grandmother) had breast cancer, and his brother had skin cancer that metastasized to his lungs.  Valerie Graham is aware of previous family history of genetic testing for hereditary cancer risks. Patient's maternal ancestors are of Native American descent, and paternal ancestors are of Caucasian descent. There is no reported Ashkenazi Jewish ancestry. There is no known consanguinity.  GENETIC COUNSELING ASSESSMENT: Valerie Graham is a 29 y.o. female with a family history of prostate and ovarian cancer which is somewhat suggestive of a hereditary cancer syndrome such as HBOC and predisposition to cancer. However, her father has already had negative genetic testing. We, therefore, discussed and recommended the following at today's visit.   DISCUSSION: We discussed that 5 - 10% of prostate cancer is hereditary, with most cases associated with BRCA1/BRCA2 mutations.   There are other genes that can be associated with hereditary prostate cancer syndromes. There are also other genes associated with ovarian cancer.  We discussed that testing is beneficial for several reasons including knowing how to follow individuals for cancer screenings and understand if other family members could be at risk for cancer and allow them to undergo genetic testing.   Given that Valerie Graham's father already had comprehensive genetic testing and that her mother's side of the family is not concerning, we discussed with Valerie Graham that the family history does not meet insurance or NCCN criteria for genetic testing. It is possible that there is something hereditary  going on on her father's side of the family, but we just haven't discovered the gene responsible yet and therefore couldn't test for it yet. It's also possible that this is familial cancer (multifactorial, caused by many different factors including lifestyle, environment and multiple small genetic changes, not a single gene) and not hereditary cancer. Thus, we did not recommend any genetic testing at this time, and recommended Valerie Graham continue to follow the cancer screening guidelines given by her primary healthcare provider. We recommended she keep in touch in case other testing becomes available that would be appropriate for her. We also still recommended her other paternal relatives have testing.   PLAN:  Valerie Graham did not wish to pursue genetic testing at today's visit. We understand this decision and remain available to coordinate genetic testing at any time in the future. We, therefore, recommend Valerie Graham continue to follow the cancer screening guidelines given by her primary healthcare provider.  Based on Valerie Graham's family history, we recommended her paternal relatives have genetic counseling and testing. Valerie Graham will let us know if we can be of any assistance in coordinating genetic counseling and/or testing for this  family member.   Lastly, we encouraged Valerie Graham to remain in contact with cancer genetics annually so that we can continuously update the family history and inform her of any changes in cancer genetics and testing that may be of benefit for this family.   Valerie Graham questions were answered to her satisfaction today. Our contact information was provided should additional questions or concerns arise. Thank you for the referral and allowing [place] to share in the care of your patient.   Faith Rogue, MS, [facility] Genetic Counselor [email or web address] Phone: [phone]  The patient was seen for a total of 45 minutes in face-to-face genetic counseling.  Drs. Magrinat, Lindi Adie and/or Burr Medico were available for discussion regarding this case.   _______________________________________________________________________ For Office Staff:  Number of people involved in session: 1 Was an Intern/ student involved with case: no

## 2019-12-11 ENCOUNTER — Other Ambulatory Visit: Payer: Self-pay

## 2019-12-12 ENCOUNTER — Ambulatory Visit (INDEPENDENT_AMBULATORY_CARE_PROVIDER_SITE_OTHER): Payer: No Typology Code available for payment source | Admitting: Nurse Practitioner

## 2019-12-12 ENCOUNTER — Encounter: Payer: Self-pay | Admitting: Nurse Practitioner

## 2019-12-12 VITALS — BP 122/70 | HR 100 | Temp 98.4°F | Ht 67.0 in | Wt 173.0 lb

## 2019-12-12 DIAGNOSIS — F4329 Adjustment disorder with other symptoms: Secondary | ICD-10-CM

## 2019-12-12 DIAGNOSIS — F4323 Adjustment disorder with mixed anxiety and depressed mood: Secondary | ICD-10-CM | POA: Diagnosis not present

## 2019-12-12 DIAGNOSIS — F411 Generalized anxiety disorder: Secondary | ICD-10-CM | POA: Insufficient documentation

## 2019-12-12 DIAGNOSIS — F4321 Adjustment disorder with depressed mood: Secondary | ICD-10-CM

## 2019-12-12 MED ORDER — ESCITALOPRAM OXALATE 10 MG PO TABS: 10.0000 mg | ORAL_TABLET | Freq: Every day | ORAL | 5 refills | Status: DC

## 2019-12-12 MED FILL — ESCITALOPRAM 10 MG TABLET: 10 | 30 days supply | Qty: 30 | Fill #0

## 2019-12-12 NOTE — Progress Notes (Signed)
   Subjective:  Patient ID: Valerie Graham, female    DOB: 10-16-1990  Age: 29 y.o. MRN: [id number]  CC: Establish Care (Anxiety and Depression)  HPI  Adjustment disorder with mixed anxiety and depressed mood Chronic, waxing and waning Worse in last 38months due to death of father (cancer complications) Will like to resume lexapro Decline referral for counseling at the time  Sent lexapro rx F/up in 3weeks  Reviewed past Medical, Social and Family history today.  Outpatient Medications Prior to Visit  Medication Sig Dispense Refill  . senna-docusate (SENOKOT-S) 8.6-50 MG tablet Take 1 tablet by mouth at bedtime as needed for moderate constipation. 30 tablet 0  . escitalopram (LEXAPRO) 10 MG tablet Take 1 tablet (10 mg total) by mouth at bedtime. 30 tablet 1  . naproxen (NAPROSYN) 500 MG tablet Take 1 tablet (500 mg total) by mouth 2 (two) times daily with a meal. 10 tablet 0  . tranexamic acid (LYSTEDA) 650 MG TABS Take 1,300 mg by mouth.     No facility-administered medications prior to visit.    ROS See HPI  Objective:  BP 122/70 (BP Location: Right Arm, Patient Position: Sitting, Cuff Size: Normal)   Pulse 100   Temp 98.4 F (36.9 C) (Temporal)   Ht 5\' 7"  (1.702 m)   Wt 173 lb (78.5 kg)   SpO2 97%   BMI 27.10 kg/m   Physical Exam Vitals reviewed.  Neurological:     Mental Status: She is alert.  Psychiatric:        Attention and Perception: Attention normal.        Mood and Affect: Mood is anxious. Affect is tearful.        Speech: Speech normal.        Behavior: Behavior is cooperative.        Thought Content: Thought content normal.        Cognition and Memory: Cognition and memory normal.        Judgment: Judgment normal.    Assessment & Plan:  This visit occurred during the SARS-CoV-2 public health emergency.  Safety protocols were in place, including screening questions prior to the visit, additional usage of staff PPE, and extensive cleaning of exam room  while observing appropriate contact time as indicated for disinfecting solutions.   Valerie Graham was seen today for establish care.  Diagnoses and all orders for this visit:  Complicated grief  Adjustment disorder with mixed anxiety and depressed mood -     escitalopram (LEXAPRO) 10 MG tablet; Take 1 tablet (10 mg total) by mouth at bedtime.   Problem List Items Addressed This Visit      Other   Adjustment disorder with mixed anxiety and depressed mood    Chronic, waxing and waning Worse in last 67months due to death of father (cancer complications) Will like to resume lexapro Decline referral for counseling at the time  Sent lexapro rx F/up in 3weeks      Relevant Medications   escitalopram (LEXAPRO) 10 MG tablet   Anxiety - Primary   Relevant Medications   escitalopram (LEXAPRO) 10 MG tablet      Follow-up: Return in about 3 weeks (around 01/02/2020) for anxiety and depression (video, 03/03/2020).  , NP

## 2019-12-12 NOTE — Assessment & Plan Note (Addendum)
Chronic, waxing and waning Impaired sleep, unable to tolerate melatonin (daytime somnolence) Worse in last 15months due to death of father (cancer complications) Will like to resume lexapro Decline referral for counseling at the time  Sent lexapro rx F/up in 3weeks

## 2019-12-12 NOTE — Patient Instructions (Signed)
Managing Loss, Adult People experience loss in many different ways throughout their lives. Events such as moving, changing jobs, and losing friends can create a sense of loss. The loss may be as serious as a major health change, divorce, death of a pet, or death of a loved one. All of these types of loss are likely to create a physical and emotional reaction known as grief. Grief is the result of a major change or an absence of something or someone that you count on. Grief is a normal reaction to loss. A variety of factors can affect your grieving experience, including:  The nature of your loss.  Your relationship to what or whom you lost.  Your understanding of grief and how to manage it.  Your support system. How to manage lifestyle changes Keep to your normal routine as much as possible.  If you have trouble focusing or doing normal activities, it is acceptable to take some time away from your normal routine.  Spend time with friends and loved ones.  Eat a healthy diet, get plenty of sleep, and rest when you feel tired. How to recognize changes  The way that you deal with your grief will affect your ability to function as you normally do. When grieving, you may experience these changes:  Numbness, shock, sadness, anxiety, anger, denial, and guilt.  Thoughts about death.  Unexpected crying.  A physical sensation of emptiness in your stomach.  Problems sleeping and eating.  Tiredness (fatigue).  Loss of interest in normal activities.  Dreaming about or imagining seeing the person who died.  A need to remember what or whom you lost.  Difficulty thinking about anything other than your loss for a period of time.  Relief. If you have been expecting the loss for a while, you may feel a sense of relief when it happens. Follow these instructions at home:  Activity Express your feelings in healthy ways, such as:  Talking with others about your loss. It may be helpful to find  others who have had a similar loss, such as a support group.  Writing down your feelings in a journal.  Doing physical activities to release stress and emotional energy.  Doing creative activities like painting, sculpting, or playing or listening to music.  Practicing resilience. This is the ability to recover and adjust after facing challenges. Reading some resources that encourage resilience may help you to learn ways to practice those behaviors. General instructions  Be patient with yourself and others. Allow the grieving process to happen, and remember that grieving takes time. ? It is likely that you may never feel completely done with some grief. You may find a way to move on while still cherishing memories and feelings about your loss. ? Accepting your loss is a process. It can take months or longer to adjust.  Keep all follow-up visits as told by your health care provider. This is important. Where to find support To get support for managing loss:  Ask your health care provider for help and recommendations, such as grief counseling or therapy.  Think about joining a support group for people who are managing a loss. Where to find more information You can find more information about managing loss from:  American Society of Clinical Oncology: www.cancer.net  American Psychological Association: www.apa.org Contact a health care provider if:  Your grief is extreme and keeps getting worse.  You have ongoing grief that does not improve.  Your body shows symptoms of grief, such   as illness.  You feel depressed, anxious, or lonely. Get help right away if:  You have thoughts about hurting yourself or others. If you ever feel like you may hurt yourself or others, or have thoughts about taking your own life, get help right away. You can go to your nearest emergency department or call:  Your local emergency services (911 in the [place]  A suicide crisis helpline, such as the  National Suicide Prevention Lifeline at 1-800-273-8255. This is open 24 hours a day. Summary  Grief is the result of a major change or an absence of someone or something that you count on. Grief is a normal reaction to loss.  The depth of grief and the period of recovery depend on the type of loss and your ability to adjust to the change and process your feelings.  Processing grief requires patience and a willingness to accept your feelings and talk about your loss with people who are supportive.  It is important to find resources that work for you and to realize that people experience grief differently. There is not one grieving process that works for everyone in the same way.  Be aware that when grief becomes extreme, it can lead to more severe issues like isolation, depression, anxiety, or suicidal thoughts. Talk with your health care provider if you have any of these issues. This information is not intended to replace advice given to you by your health care provider. Make sure you discuss any questions you have with your health care provider. Document Revised: 06/21/2018 Document Reviewed: 08/31/2016 Elsevier Patient Education  2020 Elsevier Inc.  

## 2020-01-02 ENCOUNTER — Encounter: Payer: Self-pay | Admitting: Nurse Practitioner

## 2020-01-02 ENCOUNTER — Telehealth (INDEPENDENT_AMBULATORY_CARE_PROVIDER_SITE_OTHER): Payer: No Typology Code available for payment source | Admitting: Nurse Practitioner

## 2020-01-02 VITALS — Ht 67.0 in | Wt 173.0 lb

## 2020-01-02 DIAGNOSIS — F411 Generalized anxiety disorder: Secondary | ICD-10-CM | POA: Diagnosis not present

## 2020-01-02 DIAGNOSIS — F5102 Adjustment insomnia: Secondary | ICD-10-CM

## 2020-01-02 MED ORDER — ZALEPLON 5 MG PO CAPS: 5.0000 mg | ORAL_CAPSULE | Freq: Every evening | ORAL | 1 refills | Status: DC | PRN

## 2020-01-02 MED FILL — ZALEPLON 5 MG CAPS: 5 | 30 days supply | Qty: 30 | Fill #0

## 2020-01-02 NOTE — Assessment & Plan Note (Signed)
Improved mood with lexapro 10mg  Denies and adverse side effects. Has persistent fragmented sleep: bedtime at 8:30pm, take>1hr to fall asleep, premature wake time and unable to fall asleep, daytime fatigue. No improvement with OTC sleep aid or melatonin. No caffeine intake after 12noon.  Maintain lexapro dose Start sonata prn F/up in 73months

## 2020-01-02 NOTE — Progress Notes (Signed)
Virtual Visit via Video Note  I connected with@ on 01/02/20 at  1:30 PM EDT by a video enabled telemedicine application and verified that I am speaking with the correct person using two identifiers.  Location: Patient:Home Provider: Office Participants: patient and provider  I discussed the limitations of evaluation and management by telemedicine and the availability of in person appointments. I also discussed with the patient that there may be a patient responsible charge related to this service. The patient expressed understanding and agreed to proceed.  TD:VVOHYWV and insomnia f/up  History of Present Illness: GAD (generalized anxiety disorder) Improved mood with lexapro 10mg  Denies and adverse side effects. Has persistent fragmented sleep: bedtime at 8:30pm, take>1hr to fall asleep, premature wake time and unable to fall asleep, daytime fatigue. No improvement with OTC sleep aid or melatonin. No caffeine intake after 12noon.  Maintain lexapro dose Start sonata prn F/up in 37months  Depression screen [facility] 2/9 01/02/2020 12/12/2019 12/06/2018  Decreased Interest 1 1 1   Down, Depressed, Hopeless 1 2 1   PHQ - 2 Score 2 3 2   Altered sleeping 3 3 2   Tired, decreased energy 1 2 1   Change in appetite 0 2 0  Feeling bad or failure about yourself  0 1 1  Trouble concentrating 0 3 3  Moving slowly or fidgety/restless 0 0 0  Suicidal thoughts 0 0 0  PHQ-9 Score 6 14 9   Difficult doing work/chores - Very difficult -   GAD 7 : Generalized Anxiety Score 01/02/2020 12/12/2019 12/06/2018  Nervous, Anxious, on Edge 1 3 2   Control/stop worrying 1 3 2   Worry too much - different things 1 3 1   Trouble relaxing 0 3 1  Restless 0 2 1  Easily annoyed or irritable 0 3 2  Afraid - awful might happen 0 3 1  Total GAD 7 Score 3 20 10   Anxiety Difficulty - Very difficult -   Observations/Objective: Physical Exam Vitals reviewed.  Neurological:     Mental Status: She is alert and oriented to person,  place, and time.  Psychiatric:        Mood and Affect: Mood normal.        Behavior: Behavior normal.    Assessment and Plan: Gael was seen today for follow-up.  Diagnoses and all orders for this visit:  GAD (generalized anxiety disorder)  Adjustment insomnia -     zaleplon (SONATA) 5 MG capsule; Take 1 capsule (5 mg total) by mouth at bedtime as needed for sleep.   Follow Up Instructions: See avs   I discussed the assessment and treatment plan with the patient. The patient was provided an opportunity to ask questions and all were answered. The patient agreed with the plan and demonstrated an understanding of the instructions.   The patient was advised to call back or seek an in-person evaluation if the symptoms worsen or if the condition fails to improve as anticipated.  , NP

## 2020-01-12 MED FILL — ESCITALOPRAM 10 MG TABLET: 10 | 30 days supply | Qty: 30 | Fill #1

## 2020-01-13 MED FILL — ZALEPLON 5 MG CAPS: 5 | 30 days supply | Qty: 30 | Fill #0

## 2020-01-27 ENCOUNTER — Encounter: Payer: Self-pay | Admitting: Nurse Practitioner

## 2020-01-27 ENCOUNTER — Ambulatory Visit (INDEPENDENT_AMBULATORY_CARE_PROVIDER_SITE_OTHER): Payer: No Typology Code available for payment source | Admitting: Nurse Practitioner

## 2020-01-27 VITALS — BP 104/72 | HR 72 | Temp 98.3°F | Ht 66.25 in | Wt 171.4 lb

## 2020-01-27 DIAGNOSIS — F4323 Adjustment disorder with mixed anxiety and depressed mood: Secondary | ICD-10-CM | POA: Diagnosis not present

## 2020-01-27 DIAGNOSIS — Z0000 Encounter for general adult medical examination without abnormal findings: Secondary | ICD-10-CM | POA: Diagnosis not present

## 2020-01-27 DIAGNOSIS — Z136 Encounter for screening for cardiovascular disorders: Secondary | ICD-10-CM | POA: Diagnosis not present

## 2020-01-27 DIAGNOSIS — Z13220 Encounter for screening for lipoid disorders: Secondary | ICD-10-CM

## 2020-01-27 DIAGNOSIS — F411 Generalized anxiety disorder: Secondary | ICD-10-CM | POA: Diagnosis not present

## 2020-01-27 DIAGNOSIS — F5102 Adjustment insomnia: Secondary | ICD-10-CM

## 2020-01-27 LAB — CBC WITH DIFFERENTIAL/PLATELET
Basophils Absolute: 0.1 10*3/uL (ref 0.0–0.1)
Basophils Relative: 0.8 % (ref 0.0–3.0)
Eosinophils Absolute: 0.3 10*3/uL (ref 0.0–0.7)
Eosinophils Relative: 3.2 % (ref 0.0–5.0)
HCT: 35.8 % — ABNORMAL LOW (ref 36.0–46.0)
Hemoglobin: 12.1 g/dL (ref 12.0–15.0)
Lymphocytes Relative: 22 % (ref 12.0–46.0)
Lymphs Abs: 2.2 10*3/uL (ref 0.7–4.0)
MCHC: 33.7 g/dL (ref 30.0–36.0)
MCV: 88.4 fl (ref 78.0–100.0)
Monocytes Absolute: 1 10*3/uL (ref 0.1–1.0)
Monocytes Relative: 10.4 % (ref 3.0–12.0)
Neutro Abs: 6.3 10*3/uL (ref 1.4–7.7)
Neutrophils Relative %: 63.6 % (ref 43.0–77.0)
Platelets: 251 10*3/uL (ref 150.0–400.0)
RBC: 4.05 Mil/uL (ref 3.87–5.11)
RDW: 14.2 % (ref 11.5–15.5)
WBC: 9.9 10*3/uL (ref 4.0–10.5)

## 2020-01-27 MED ORDER — ESCITALOPRAM OXALATE 10 MG PO TABS: 10.0000 mg | ORAL_TABLET | Freq: Every day | ORAL | 3 refills | Status: DC

## 2020-01-27 MED ORDER — ZALEPLON 5 MG PO CAPS: 5.0000 mg | ORAL_CAPSULE | Freq: Every evening | ORAL | 5 refills | Status: DC | PRN

## 2020-01-27 NOTE — Patient Instructions (Signed)
Go to lab for blood draw. Maintain current medications   Preventive Care 28-29 Years Old, Female Preventive care refers to visits with your health care provider and lifestyle choices that can promote health and wellness. This includes:  A yearly physical exam. This may also be called an annual well check.  Regular dental visits and eye exams.  Immunizations.  Screening for certain conditions.  Healthy lifestyle choices, such as eating a healthy diet, getting regular exercise, not using drugs or products that contain nicotine and tobacco, and limiting alcohol use. What can I expect for my preventive care visit? Physical exam Your health care provider will check your:  Height and weight. This may be used to calculate body mass index (BMI), which tells if you are at a healthy weight.  Heart rate and blood pressure.  Skin for abnormal spots. Counseling Your health care provider may ask you questions about your:  Alcohol, tobacco, and drug use.  Emotional well-being.  Home and relationship well-being.  Sexual activity.  Eating habits.  Work and work Statistician.  Method of birth control.  Menstrual cycle.  Pregnancy history. What immunizations do I need?  Influenza (flu) vaccine  This is recommended every year. Tetanus, diphtheria, and pertussis (Tdap) vaccine  You may need a Td booster every 10 years. Varicella (chickenpox) vaccine  You may need this if you have not been vaccinated. Human papillomavirus (HPV) vaccine  If recommended by your health care provider, you may need three doses over 6 months. Measles, mumps, and rubella (MMR) vaccine  You may need at least one dose of MMR. You may also need a second dose. Meningococcal conjugate (MenACWY) vaccine  One dose is recommended if you are age 7-21 years and a first-year college student living in a residence hall, or if you have one of several medical conditions. You may also need additional booster  doses. Pneumococcal conjugate (PCV13) vaccine  You may need this if you have certain conditions and were not previously vaccinated. Pneumococcal polysaccharide (PPSV23) vaccine  You may need one or two doses if you smoke cigarettes or if you have certain conditions. Hepatitis A vaccine  You may need this if you have certain conditions or if you travel or work in places where you may be exposed to hepatitis A. Hepatitis B vaccine  You may need this if you have certain conditions or if you travel or work in places where you may be exposed to hepatitis B. Haemophilus influenzae type b (Hib) vaccine  You may need this if you have certain conditions. You may receive vaccines as individual doses or as more than one vaccine together in one shot (combination vaccines). Talk with your health care provider about the risks and benefits of combination vaccines. What tests do I need?  Blood tests  Lipid and cholesterol levels. These may be checked every 5 years starting at age 54.  Hepatitis C test.  Hepatitis B test. Screening  Diabetes screening. This is done by checking your blood sugar (glucose) after you have not eaten for a while (fasting).  Sexually transmitted disease (STD) testing.  BRCA-related cancer screening. This may be done if you have a family history of breast, ovarian, tubal, or peritoneal cancers.  Pelvic exam and Pap test. This may be done every 3 years starting at age 63. Starting at age 39, this may be done every 5 years if you have a Pap test in combination with an HPV test. Talk with your health care provider about your test  results, treatment options, and if necessary, the need for more tests. Follow these instructions at home: Eating and drinking   Eat a diet that includes fresh fruits and vegetables, whole grains, lean protein, and low-fat dairy.  Take vitamin and mineral supplements as recommended by your health care provider.  Do not drink alcohol  if: ? Your health care provider tells you not to drink. ? You are pregnant, may be pregnant, or are planning to become pregnant.  If you drink alcohol: ? Limit how much you have to 0-1 drink a day. ? Be aware of how much alcohol is in your drink. In the [place] one drink equals one 12 oz bottle of beer (355 mL), one 5 oz glass of wine (148 mL), or one 1 oz glass of hard liquor (44 mL). Lifestyle  Take daily care of your teeth and gums.  Stay active. Exercise for at least 30 minutes on 5 or more days each week.  Do not use any products that contain nicotine or tobacco, such as cigarettes, e-cigarettes, and chewing tobacco. If you need help quitting, ask your health care provider.  If you are sexually active, practice safe sex. Use a condom or other form of birth control (contraception) in order to prevent pregnancy and STIs (sexually transmitted infections). If you plan to become pregnant, see your health care provider for a preconception visit. What's next?  Visit your health care provider once a year for a well check visit.  Ask your health care provider how often you should have your eyes and teeth checked.  Stay up to date on all vaccines. This information is not intended to replace advice given to you by your health care provider. Make sure you discuss any questions you have with your health care provider. Document Revised: 12/27/2017 Document Reviewed: 12/27/2017 Elsevier Patient Education  2020 [employer].

## 2020-01-27 NOTE — Assessment & Plan Note (Signed)
Improve sleep quality with use of sonata Stable mood with lexapro 10mg   Maintain current medications

## 2020-01-27 NOTE — Progress Notes (Signed)
Subjective:    Patient ID: Valerie Graham, female    DOB: 1990-10-23, 29 y.o.   MRN: [id number]  Patient presents today for CPE and eval of anxiety/sleep  HPI GAD (generalized anxiety disorder) Improve sleep quality with use of sonata Stable mood with lexapro 10mg   Maintain current medications   Sexual History (orientation,birth control, marital status, STD):sexually active, no contraception use, regular menstrual cycles, denies need for STD screen, up to date with PAP, denies breast exam today.  Depression/Suicide:stable  Depression screen [facility] 2/9 01/27/2020 01/02/2020 12/12/2019 12/06/2018 12/06/2018 07/12/2017  Decreased Interest 1 1 1 1  0 0  Down, Depressed, Hopeless 2 1 2 1  0 0  PHQ - 2 Score 3 2 3 2  0 0  Altered sleeping 2 3 3 2  - -  Tired, decreased energy 2 1 2 1  - -  Change in appetite 1 0 2 0 - -  Feeling bad or failure about yourself  1 0 1 1 - -  Trouble concentrating 2 0 3 3 - -  Moving slowly or fidgety/restless 0 0 0 0 - -  Suicidal thoughts 0 0 0 0 - -  PHQ-9 Score 11 6 14 9  - -  Difficult doing work/chores Somewhat difficult - Very difficult - - -   Vision:up to date  Dental:up to date  Immunizations: (TDAP, Hep C screen, Pneumovax, Influenza, zoster)  Health Maintenance  Topic Date Due  .  Hepatitis C: One time screening is recommended by Center for Disease Control  (CDC) for  adults born from [birth year] through 1965.   Never done  . HIV Screening  Never done  . Flu Shot  11/30/2019  . Pap Smear  02/18/2021  . Pap Smear  02/18/2021  . Tetanus Vaccine  01/30/2027   Diet:regular  Weight:  Wt Readings from Last 3 Encounters:  01/27/20 171 lb 6.4 oz (77.7 kg)  01/02/20 173 lb (78.5 kg)  12/12/19 173 lb (78.5 kg)   Fall Risk: Fall Risk  12/12/2019 12/06/2018 07/12/2017  Falls in the past year? 0 1 No  Number falls in past yr: - 0 -  Comment - accident -  Injury with Fall? - 0 -  Comment - sore -    Medications and allergies reviewed with patient and updated if  appropriate.  Patient Active Problem List   Diagnosis Date Noted  . GAD (generalized anxiety disorder) 12/12/2019  . Family history of prostate cancer   . Family history of ovarian cancer   . Family history of skin cancer     Current Outpatient Medications on File Prior to Visit  Medication Sig Dispense Refill  . senna-docusate (SENOKOT-S) 8.6-50 MG tablet Take 1 tablet by mouth at bedtime as needed for moderate constipation. 30 tablet 0   No current facility-administered medications on file prior to visit.    Past Medical History:  Diagnosis Date  . Anxiety   . Chicken pox   . Depression   . Family history of ovarian cancer   . Family history of prostate cancer   . Family history of skin cancer   . Herpes simplex without mention of complication 07/02/11    Past Surgical History:  Procedure Laterality Date  . TONSILLECTOMY  2014  . WISDOM TOOTH EXTRACTION      Social History   Socioeconomic History  . Marital status: Single    Spouse name: Not on file  . Number of children: Not on file  . Years of education: Not on file  .  Highest education level: Not on file  Occupational History  . Not on file  Tobacco Use  . Smoking status: Never Smoker  . Smokeless tobacco: Never Used  Vaping Use  . Vaping Use: Never used  Substance and Sexual Activity  . Alcohol use: No  . Drug use: No  . Sexual activity: Yes    Birth control/protection: None  Other Topics Concern  . Not on file  Social History Narrative  . Not on file   Social Determinants of Health   Financial Resource Strain:   . Difficulty of Paying Living Expenses: Not on file  Food Insecurity:   . Worried About Programme researcher, broadcasting/film/video in the Last Year: Not on file  . Ran Out of Food in the Last Year: Not on file  Transportation Needs:   . Lack of Transportation (Medical): Not on file  . Lack of Transportation (Non-Medical): Not on file  Physical Activity:   . Days of Exercise per Week: Not on file  . Minutes  of Exercise per Session: Not on file  Stress:   . Feeling of Stress : Not on file  Social Connections:   . Frequency of Communication with Friends and Family: Not on file  . Frequency of Social Gatherings with Friends and Family: Not on file  . Attends Religious Services: Not on file  . Active Member of Clubs or Organizations: Not on file  . Attends Banker Meetings: Not on file  . Marital Status: Not on file    Family History  Problem Relation Age of Onset  . Cancer Paternal Grandfather   . Cancer Maternal Grandmother 45       breast  . Cancer Father 73       bladder/prostate metastatic, neg genetic testing 2019  . Endometriosis Mother 30       hysterectomy  . Diabetes Maternal Aunt   . Cancer Paternal Uncle 75       prostate  . Stroke Paternal Uncle   . Cancer Paternal Aunt        ovarian  . Cancer Other        colon cancer        Review of Systems  Constitutional: Negative for fever, malaise/fatigue and weight loss.  HENT: Negative for congestion and sore throat.   Eyes:       Negative for visual changes  Respiratory: Negative for cough and shortness of breath.   Cardiovascular: Negative for chest pain, palpitations and leg swelling.  Gastrointestinal: Negative for blood in stool, constipation, diarrhea and heartburn.  Genitourinary: Negative for dysuria, frequency and urgency.  Musculoskeletal: Negative for falls, joint pain and myalgias.  Skin: Negative for rash.  Neurological: Negative for dizziness, sensory change and headaches.  Endo/Heme/Allergies: Does not bruise/bleed easily.  Psychiatric/Behavioral: Positive for depression. Negative for substance abuse and suicidal ideas. The patient is nervous/anxious. The patient does not have insomnia.     Objective:   Vitals:   01/27/20 1334  BP: 104/72  Pulse: 72  Temp: 98.3 F (36.8 C)  SpO2: 99%    Body mass index is 27.46 kg/m.   Physical Examination:  Physical Exam Vitals reviewed.    Constitutional:      General: She is not in acute distress.    Appearance: She is well-developed.  HENT:     Right Ear: Tympanic membrane, ear canal and external ear normal.     Left Ear: Tympanic membrane, ear canal and external ear normal.  Eyes:  Extraocular Movements: Extraocular movements intact.     Conjunctiva/sclera: Conjunctivae normal.  Cardiovascular:     Rate and Rhythm: Normal rate and regular rhythm.     Heart sounds: Normal heart sounds.  Pulmonary:     Effort: Pulmonary effort is normal. No respiratory distress.     Breath sounds: Normal breath sounds.  Chest:     Chest wall: No tenderness.  Abdominal:     General: Bowel sounds are normal.     Palpations: Abdomen is soft.  Musculoskeletal:        General: Normal range of motion.     Cervical back: Normal range of motion and neck supple.     Right lower leg: No edema.     Left lower leg: No edema.  Lymphadenopathy:     Cervical: No cervical adenopathy.  Skin:    General: Skin is warm and dry.  Neurological:     Mental Status: She is alert and oriented to person, place, and time.     Deep Tendon Reflexes: Reflexes are normal and symmetric.  Psychiatric:        Mood and Affect: Mood normal.        Behavior: Behavior normal.        Thought Content: Thought content normal.     ASSESSMENT and PLAN: This visit occurred during the SARS-CoV-2 public health emergency.  Safety protocols were in place, including screening questions prior to the visit, additional usage of staff PPE, and extensive cleaning of exam room while observing appropriate contact time as indicated for disinfecting solutions.   Betina was seen today for annual exam.  Diagnoses and all orders for this visit:  Preventative health care -     Comprehensive metabolic panel -     CBC with Differential/Platelet -     TSH -     Lipid panel  GAD (generalized anxiety disorder)  Encounter for lipid screening for cardiovascular disease -      Lipid panel  Adjustment disorder with mixed anxiety and depressed mood -     escitalopram (LEXAPRO) 10 MG tablet; Take 1 tablet (10 mg total) by mouth at bedtime.  Adjustment insomnia -     zaleplon (SONATA) 5 MG capsule; Take 1 capsule (5 mg total) by mouth at bedtime as needed for sleep.       Problem List Items Addressed This Visit      Other   GAD (generalized anxiety disorder)    Improve sleep quality with use of sonata Stable mood with lexapro 10mg   Maintain current medications      Relevant Medications   escitalopram (LEXAPRO) 10 MG tablet    Other Visit Diagnoses    Preventative health care    -  Primary   Relevant Orders   Comprehensive metabolic panel   CBC with Differential/Platelet   TSH   Lipid panel   Encounter for lipid screening for cardiovascular disease       Relevant Orders   Lipid panel   Adjustment disorder with mixed anxiety and depressed mood       Relevant Medications   escitalopram (LEXAPRO) 10 MG tablet   Adjustment insomnia       Relevant Medications   zaleplon (SONATA) 5 MG capsule      Follow up: Return in about 3 months (around 04/27/2020) for maintain upcoming appt.  04/29/2020, NP

## 2020-01-28 LAB — LIPID PANEL
Cholesterol: 175 mg/dL (ref 0–200)
HDL: 55.1 mg/dL (ref >39.00)
LDL Cholesterol: 105 mg/dL — ABNORMAL HIGH (ref 0–99)
NonHDL: 119.44
Total CHOL/HDL Ratio: 3
Triglycerides: 73 mg/dL (ref 0.0–149.0)
VLDL: 14.6 mg/dL (ref 0.0–40.0)

## 2020-01-28 LAB — TSH: TSH: 2.07 u[IU]/mL (ref 0.35–4.50)

## 2020-01-28 LAB — COMPREHENSIVE METABOLIC PANEL
ALT: 11 U/L (ref 0–35)
AST: 16 U/L (ref 0–37)
Albumin: 4.5 g/dL (ref 3.5–5.2)
Alkaline Phosphatase: 75 U/L (ref 39–117)
BUN: 12 mg/dL (ref 6–23)
CO2: 26 mEq/L (ref 19–32)
Calcium: 9.2 mg/dL (ref 8.4–10.5)
Chloride: 102 mEq/L (ref 96–112)
Creatinine, Ser: 0.85 mg/dL (ref 0.40–1.20)
GFR: 78.7 mL/min (ref >60.00)
Glucose, Bld: 85 mg/dL (ref 70–99)
Potassium: 4 mEq/L (ref 3.5–5.1)
Sodium: 136 mEq/L (ref 135–145)
Total Bilirubin: 0.2 mg/dL (ref 0.2–1.2)
Total Protein: 7.3 g/dL (ref 6.0–8.3)

## 2020-02-05 MED FILL — ESCITALOPRAM 10 MG TABLET: 10 | 90 days supply | Qty: 90 | Fill #0

## 2020-03-05 ENCOUNTER — Encounter: Payer: Self-pay | Admitting: Nurse Practitioner

## 2020-03-05 DIAGNOSIS — N979 Female infertility, unspecified: Secondary | ICD-10-CM

## 2020-03-15 ENCOUNTER — Emergency Department (HOSPITAL_COMMUNITY)
Admission: EM | Admit: 2020-03-15 | Discharge: 2020-03-15 | Disposition: A | Discharge status: Home / Self Care | Payer: No Typology Code available for payment source | Attending: Emergency Medicine | Admitting: Emergency Medicine

## 2020-03-15 ENCOUNTER — Other Ambulatory Visit: Payer: Self-pay

## 2020-03-15 ENCOUNTER — Other Ambulatory Visit (HOSPITAL_COMMUNITY): Payer: Self-pay | Admitting: Emergency Medicine

## 2020-03-15 ENCOUNTER — Emergency Department (HOSPITAL_COMMUNITY): Payer: No Typology Code available for payment source

## 2020-03-15 DIAGNOSIS — W109XXA Fall (on) (from) unspecified stairs and steps, initial encounter: Secondary | ICD-10-CM | POA: Insufficient documentation

## 2020-03-15 DIAGNOSIS — S6992XA Unspecified injury of left wrist, hand and finger(s), initial encounter: Secondary | ICD-10-CM | POA: Diagnosis present

## 2020-03-15 DIAGNOSIS — S52572A Other intraarticular fracture of lower end of left radius, initial encounter for closed fracture: Secondary | ICD-10-CM | POA: Insufficient documentation

## 2020-03-15 MED ORDER — MORPHINE SULFATE 15 MG PO TABS
7.5000 mg | ORAL_TABLET | ORAL | 0 refills | Status: DC | PRN
Start: 2020-03-15 — End: 2020-05-19

## 2020-03-15 MED ORDER — ACETAMINOPHEN 500 MG PO TABS
1000.0000 mg | ORAL_TABLET | Freq: Once | ORAL | Status: AC
  Administered 2020-03-15: 1000 mg via ORAL
  Filled 2020-03-15: qty 2

## 2020-03-15 MED ORDER — OXYCODONE HCL 5 MG PO TABS
5.0000 mg | ORAL_TABLET | Freq: Once | ORAL | Status: AC
  Administered 2020-03-15: 5 mg via ORAL
  Filled 2020-03-15: qty 1

## 2020-03-15 MED FILL — MORPHINE SULFATE 15 MG TABS: 15 | 2 days supply | Qty: 5 | Fill #0

## 2020-03-15 NOTE — ED Provider Notes (Signed)
[facility]-EMERGENCY DEPT Provider Note   CSN: [id number] Arrival date & time: 03/15/20  4098     History Chief Complaint  Patient presents with  . Wrist Injury    left    Valerie Graham is a 29 y.o. female.  28 yo F with a chief complaint of left wrist pain.  The patient had fallen down the stairs and landed with a flexed wrist.  Complaining of pain to that wrist but nowhere else significantly.  She thinks she might of passed out after the fall.  Denies headache denies confusion denies one-sided numbness or weakness.  Denies neck pain back pain chest pain abdominal pain.  The history is provided by the patient.  Wrist Injury Location:  Wrist Wrist location:  L wrist Injury: yes   Time since incident:  2 hours Mechanism of injury: fall   Fall:    Fall occurred:  Down stairs   Impact surface:  Hard floor   Point of impact:  Hands   Entrapped after fall: no   Pain details:    Radiates to:  L wrist   Severity:  Moderate   Onset quality:  Gradual   Duration:  2 hours   Timing:  Constant   Progression:  Worsening Prior injury to area:  No Relieved by:  Nothing Worsened by:  Bearing weight and movement Ineffective treatments:  None tried Associated symptoms: no fever        Past Medical History:  Diagnosis Date  . Anxiety   . Chicken pox   . Depression   . Family history of ovarian cancer   . Family history of prostate cancer   . Family history of skin cancer   . Herpes simplex without mention of complication 07/02/11    Patient Active Problem List   Diagnosis Date Noted  . GAD (generalized anxiety disorder) 12/12/2019  . Family history of prostate cancer   . Family history of ovarian cancer   . Family history of skin cancer     Past Surgical History:  Procedure Laterality Date  . TONSILLECTOMY  2014  . WISDOM TOOTH EXTRACTION       OB History    Gravida  0   Para  0   Term  0   Preterm  0   AB  0   Living  0     SAB    0   TAB  0   Ectopic  0   Multiple  0   Live Births              Family History  Problem Relation Age of Onset  . Cancer Paternal Grandfather   . Cancer Maternal Grandmother 65       breast  . Cancer Father 62       bladder/prostate metastatic, neg genetic testing 2019  . Endometriosis Mother 30       hysterectomy  . Diabetes Maternal Aunt   . Cancer Paternal Uncle 65       prostate  . Stroke Paternal Uncle   . Cancer Paternal Aunt        ovarian  . Cancer Other        colon cancer    Social History   Tobacco Use  . Smoking status: Never Smoker  . Smokeless tobacco: Never Used  Vaping Use  . Vaping Use: Never used  Substance Use Topics  . Alcohol use: No  . Drug use: No  Home Medications Prior to Admission medications   Medication Sig Start Date End Date Taking? Authorizing Provider  escitalopram (LEXAPRO) 10 MG tablet Take 1 tablet (10 mg total) by mouth at bedtime. 01/27/20   Nche, Bonna Gains, NP  morphine (MSIR) 15 MG tablet Take 0.5 tablets (7.5 mg total) by mouth every 4 (four) hours as needed for severe pain. 03/15/20   Melene Plan, DO  senna-docusate (SENOKOT-S) 8.6-50 MG tablet Take 1 tablet by mouth at bedtime as needed for moderate constipation. 07/12/17   Nche, Bonna Gains, NP  zaleplon (SONATA) 5 MG capsule Take 1 capsule (5 mg total) by mouth at bedtime as needed for sleep. 01/27/20   Nche, Bonna Gains, NP    Allergies    Patient has no known allergies.  Review of Systems   Review of Systems  Constitutional: Negative for chills and fever.  HENT: Negative for congestion and rhinorrhea.   Eyes: Negative for redness and visual disturbance.  Respiratory: Negative for shortness of breath and wheezing.   Cardiovascular: Negative for chest pain and palpitations.  Gastrointestinal: Negative for nausea and vomiting.  Genitourinary: Negative for dysuria and urgency.  Musculoskeletal: Positive for arthralgias. Negative for myalgias.  Skin:  Negative for pallor and wound.  Neurological: Negative for dizziness and headaches.    Physical Exam Updated Vital Signs BP 102/61 (BP Location: Right Arm)   Pulse 88   Temp 97.6 F (36.4 C) (Oral)   Resp 16   Ht 5\' 7"  (1.702 m)   Wt 77.1 kg   BMI 26.63 kg/m   Physical Exam Vitals and nursing note reviewed.  Constitutional:      General: She is not in acute distress.    Appearance: She is well-developed. She is not diaphoretic.  HENT:     Head: Normocephalic and atraumatic.  Eyes:     Pupils: Pupils are equal, round, and reactive to light.  Cardiovascular:     Rate and Rhythm: Normal rate and regular rhythm.     Heart sounds: No murmur heard.  No friction rub. No gallop.   Pulmonary:     Effort: Pulmonary effort is normal.     Breath sounds: No wheezing or rales.  Abdominal:     General: There is no distension.     Palpations: Abdomen is soft.     Tenderness: There is no abdominal tenderness.  Musculoskeletal:        General: Tenderness present.     Cervical back: Normal range of motion and neck supple.     Comments: Mild pain and swelling to the distal radius.  PMS intact with the exception of pain with spreading of the fingers.  Skin:    General: Skin is warm and dry.  Neurological:     Mental Status: She is alert and oriented to person, place, and time.  Psychiatric:        Behavior: Behavior normal.     ED Results / Procedures / Treatments   Labs (all labs ordered are listed, but only abnormal results are displayed) Labs Reviewed - No data to display  EKG None  Radiology DG Wrist Complete Left  Result Date: 03/15/2020 CLINICAL DATA:  03/17/2020 with pain and bruising. EXAM: LEFT WRIST - COMPLETE 3+ VIEW COMPARISON:  None. FINDINGS: Fracture of the distal radius. One fracture line extends to the distal radial articular surface centrally. Fracture line extends in the coronal plane into the metaphyseal region. No evidence of displacement or angulation. No  fracture of the ulna  or of the carpal bones. IMPRESSION: Nondisplaced fracture of the distal radius with extension to the articular surface. Electronically Signed   By: Paulina Fusi M.D.   On: 03/15/2020 09:39    Procedures Procedures (including critical care time)  Medications Ordered in ED Medications  acetaminophen (TYLENOL) tablet 1,000 mg (1,000 mg Oral Given 03/15/20 0929)  oxyCODONE (Oxy IR/ROXICODONE) immediate release tablet 5 mg (5 mg Oral Given 03/15/20 0388)    ED Course  I have reviewed the triage vital signs and the nursing notes.  Pertinent labs & imaging results that were available during my care of the patient were reviewed by me and considered in my medical decision making (see chart for details).    MDM Rules/Calculators/A&P                          29 yo F with a cc of L wrist pain.  Occurred post fall. Will xray.   Plain film of the wrist with a nondisplaced distal radius fracture.  Will place in a sugar tong.  Orthopedic follow-up.  10:13 AM:  I have discussed the diagnosis/risks/treatment options with the patient and family and believe the pt to be eligible for discharge home to follow-up with Ortho. We also discussed returning to the ED immediately if new or worsening sx occur. We discussed the sx which are most concerning (e.g., sudden worsening pain, fever, inability to tolerate by mouth) that necessitate immediate return. Medications administered to the patient during their visit and any new prescriptions provided to the patient are listed below.  Medications given during this visit Medications  acetaminophen (TYLENOL) tablet 1,000 mg (1,000 mg Oral Given 03/15/20 0929)  oxyCODONE (Oxy IR/ROXICODONE) immediate release tablet 5 mg (5 mg Oral Given 03/15/20 8280)     The patient appears reasonably screen and/or stabilized for discharge and I doubt any other medical condition or other [facility] requiring further screening, evaluation, or treatment in the ED at this  time prior to discharge.   Final Clinical Impression(s) / ED Diagnoses Final diagnoses:  Other closed intra-articular fracture of distal end of left radius, initial encounter    Rx / DC Orders ED Discharge Orders         Ordered    morphine (MSIR) 15 MG tablet  Every 4 hours PRN        03/15/20 1002           [place], DO 03/15/20 1013

## 2020-03-15 NOTE — ED Triage Notes (Signed)
Pt POV - reports falling down stairs, catching herself with left arm.  Denies LOC, denies head trauma, denies taking blood thinners. C/o left wrist pain.

## 2020-03-15 NOTE — ED Notes (Signed)
Family at bedside. 

## 2020-03-15 NOTE — ED Notes (Signed)
Discharge paperwork reviewed with pt, including prescription.  Pt with no questions or concerns at time of discharge, ambulatory with husband to ED entrance.

## 2020-03-15 NOTE — Discharge Instructions (Signed)

## 2020-03-15 NOTE — ED Notes (Signed)
Ortho Tech at bedside.  

## 2020-03-15 NOTE — Progress Notes (Signed)
Orthopedic Tech Progress Note Patient Details:  Valerie Graham 25-Nov-1990 [id number]  Ortho Devices Type of Ortho Device: Ace wrap, Arm sling, Sugartong splint Ortho Device/Splint Location: left Ortho Device/Splint Interventions: Application   Post Interventions Patient Tolerated: Well Instructions Provided: Care of device   Saul Fordyce 03/15/2020, 10:12 AM

## 2020-03-17 ENCOUNTER — Other Ambulatory Visit: Payer: Self-pay | Admitting: Orthopaedic Surgery

## 2020-03-17 DIAGNOSIS — M25532 Pain in left wrist: Secondary | ICD-10-CM

## 2020-03-18 ENCOUNTER — Ambulatory Visit
Admission: RE | Admit: 2020-03-18 | Discharge: 2020-03-18 | Disposition: A | Discharge status: Home / Self Care | Payer: No Typology Code available for payment source | Source: Ambulatory Visit | Attending: Orthopaedic Surgery | Admitting: Orthopaedic Surgery

## 2020-03-18 ENCOUNTER — Other Ambulatory Visit: Payer: Self-pay

## 2020-03-18 DIAGNOSIS — M25532 Pain in left wrist: Secondary | ICD-10-CM

## 2020-04-09 ENCOUNTER — Encounter: Payer: Self-pay | Admitting: Nurse Practitioner

## 2020-04-09 ENCOUNTER — Telehealth (INDEPENDENT_AMBULATORY_CARE_PROVIDER_SITE_OTHER): Payer: No Typology Code available for payment source | Admitting: Nurse Practitioner

## 2020-04-09 DIAGNOSIS — F411 Generalized anxiety disorder: Secondary | ICD-10-CM | POA: Diagnosis not present

## 2020-04-09 NOTE — Assessment & Plan Note (Signed)
Stable with lexapro Use sonata 1-2x/week for sleep Maintain current medications F/up in 15months

## 2020-04-09 NOTE — Progress Notes (Signed)
Virtual Visit via Video Note  I connected with@ on 04/09/20 at  1:30 PM EST by a video enabled telemedicine application and verified that I am speaking with the correct person using two identifiers.  Location: Patient:Home Provider: Office Participants: patient and provider   I discussed the limitations of evaluation and management by telemedicine and the availability of in person appointments. I also discussed with the patient that there may be a patient responsible charge related to this service. The patient expressed understanding and agreed to proceed.  PJ:[id number] f/up  History of Present Illness:  Depression screen [facility] 2/9 04/09/2020 01/27/2020 01/02/2020  Decreased Interest 1 1 1   Down, Depressed, Hopeless 1 2 1   PHQ - 2 Score 2 3 2   Altered sleeping 0 2 3  Tired, decreased energy 0 2 1  Change in appetite 0 1 0  Feeling bad or failure about yourself  0 1 0  Trouble concentrating 1 2 0  Moving slowly or fidgety/restless 0 0 0  Suicidal thoughts 0 0 0  PHQ-9 Score 3 11 6   Difficult doing work/chores Somewhat difficult Somewhat difficult -   GAD 7 : Generalized Anxiety Score 04/09/2020 01/27/2020 01/02/2020 12/12/2019  Nervous, Anxious, on Edge 1 1 1 3   Control/stop worrying 1 1 1 3   Worry too much - different things 1 1 1 3   Trouble relaxing 1 1 0 3  Restless 1 1 0 2  Easily annoyed or irritable 1 1 0 3  Afraid - awful might happen 2 2 0 3  Total GAD 7 Score 8 8 3 20   Anxiety Difficulty Not difficult at all Somewhat difficult - Very difficult   Observations/Objective: Physical Exam Vitals reviewed.  Neurological:     Mental Status: She is alert and oriented to person, place, and time.  Psychiatric:        Mood and Affect: Mood normal.        Behavior: Behavior normal.        Thought Content: Thought content normal.    Assessment and Plan: Valerie Graham was seen today for follow-up.  Diagnoses and all orders for this visit:  GAD (generalized anxiety  disorder)   Follow Up Instructions: See avs   I discussed the assessment and treatment plan with the patient. The patient was provided an opportunity to ask questions and all were answered. The patient agreed with the plan and demonstrated an understanding of the instructions.   The patient was advised to call back or seek an in-person evaluation if the symptoms worsen or if the condition fails to improve as anticipated.  01/29/2020, NP

## 2020-04-14 ENCOUNTER — Other Ambulatory Visit (HOSPITAL_COMMUNITY): Payer: Self-pay | Admitting: Obstetrics and Gynecology

## 2020-04-14 MED FILL — OVIDREL 250 MCG/0.5 ML SYRG: 250 | 30 days supply | Qty: 1 | Fill #0

## 2020-04-14 MED FILL — LETROZOLE 2.5 MG TABLET: 2.5 | 5 days supply | Qty: 15 | Fill #0

## 2020-05-13 ENCOUNTER — Encounter: Payer: Self-pay | Admitting: Nurse Practitioner

## 2020-05-19 ENCOUNTER — Encounter: Payer: Self-pay | Admitting: Nurse Practitioner

## 2020-05-19 ENCOUNTER — Telehealth (INDEPENDENT_AMBULATORY_CARE_PROVIDER_SITE_OTHER): Payer: No Typology Code available for payment source | Admitting: Nurse Practitioner

## 2020-05-19 VITALS — Ht 67.0 in | Wt 171.0 lb

## 2020-05-19 DIAGNOSIS — F411 Generalized anxiety disorder: Secondary | ICD-10-CM | POA: Diagnosis not present

## 2020-05-19 MED ORDER — SERTRALINE HCL 25 MG PO TABS: 25.0000 mg | ORAL_TABLET | Freq: Every day | ORAL | 11 refills | Status: DC

## 2020-05-19 MED FILL — SERTRALINE HCL 25 MG TABLET: 25 | 30 days supply | Qty: 30 | Fill #0

## 2020-05-19 NOTE — Assessment & Plan Note (Signed)
IUI inserted 1week ago. Wants to know if sonata and lexapro are safe during pregnancy. With limited safety date on these medications, advised to d/c sonata and lexapro. Start zoloft 25mg  and use melatonin or benadryl at hs prn for sleep F/up in 70month

## 2020-05-19 NOTE — Progress Notes (Signed)
Virtual Visit via Telephone Note  I connected with Valerie Graham on 05/19/20 at 11:30 AM EST by telephone and verified that I am speaking with the correct person using two identifiers.  Location: Patient: home Provider: office Participants: patient and provider  I discussed the limitations, risks, security and privacy concerns of performing an evaluation and management service by telephone and the availability of in person appointments. I also discussed with the patient that there may be a patient responsible charge related to this service. The patient expressed understanding and agreed to proceed. She was unable to connect via video application.  CC: anxiety f/up and medication management  History of Present Illness: GAD (generalized anxiety disorder) IUI inserted 1week ago. Wants to know if sonata and lexapro are safe during pregnancy. With limited safety date on these medications, advised to d/c sonata and lexapro. Start zoloft 25mg  and use melatonin or benadryl at hs prn for sleep F/up in 10month  Observations/Objective: Alert and orientedx4 Normal speech  Assessment and Plan: Kaidyn was seen today for follow-up.  Diagnoses and all orders for this visit:  GAD (generalized anxiety disorder) -     sertraline (ZOLOFT) 25 MG tablet; Take 1 tablet (25 mg total) by mouth daily.   Follow Up Instructions: See above   I discussed the assessment and treatment plan with the patient. The patient was provided an opportunity to ask questions and all were answered. The patient agreed with the plan and demonstrated an understanding of the instructions.   The patient was advised to call back or seek an in-person evaluation if the symptoms worsen or if the condition fails to improve as anticipated.  I provided 10 minutes of non-face-to-face time during this encounter.  Herbert Seta, NP

## 2020-05-31 ENCOUNTER — Other Ambulatory Visit (HOSPITAL_COMMUNITY): Payer: Self-pay | Admitting: Obstetrics and Gynecology

## 2020-05-31 MED FILL — LETROZOLE 2.5 MG TABLET: 2.5 | 5 days supply | Qty: 15 | Fill #0

## 2020-05-31 MED FILL — OVIDREL 250 MCG/0.5 ML SYRG: 250 | 1 days supply | Qty: 1 | Fill #0

## 2020-06-18 ENCOUNTER — Telehealth (INDEPENDENT_AMBULATORY_CARE_PROVIDER_SITE_OTHER): Payer: No Typology Code available for payment source | Admitting: Nurse Practitioner

## 2020-06-18 ENCOUNTER — Encounter: Payer: Self-pay | Admitting: Nurse Practitioner

## 2020-06-18 ENCOUNTER — Other Ambulatory Visit: Payer: Self-pay | Admitting: Nurse Practitioner

## 2020-06-18 DIAGNOSIS — F411 Generalized anxiety disorder: Secondary | ICD-10-CM | POA: Diagnosis not present

## 2020-06-18 MED ORDER — SERTRALINE HCL 50 MG PO TABS: 50.0000 mg | ORAL_TABLET | Freq: Every day | ORAL | 1 refills | Status: DC

## 2020-06-18 MED FILL — SERTRALINE HCL 50 MG TABS: 50 | 90 days supply | Qty: 90 | Fill #0

## 2020-06-18 NOTE — Assessment & Plan Note (Signed)
No change in mood and no adverse side effect with zoloft Unable to afford sessions with therapist at this time. Advised to try Headspace app. Increase zoloft dose to 50mg  daily F/up in 34month

## 2020-06-18 NOTE — Progress Notes (Signed)
Virtual Visit via Video Note  I connected with@ on 06/18/20 at  1:30 PM EST by a video enabled telemedicine application and verified that I am speaking with the correct person using two identifiers.  Location: Patient:Home Provider: Office Participants: patient and provider  I discussed the limitations of evaluation and management by telemedicine and the availability of in person appointments. I also discussed with the patient that there may be a patient responsible charge related to this service. The patient expressed understanding and agreed to proceed.  XN:ATFTDDU and depression f/up  History of Present Illness: Increased anxiety due to failed IUI.  GAD (generalized anxiety disorder) No change in mood and no adverse side effect with zoloft Unable to afford sessions with therapist at this time. Advised to try Headspace app. Increase zoloft dose to 50mg  daily F/up in 46month  Observations/Objective: Physical Exam Vitals reviewed.  Pulmonary:     Effort: Pulmonary effort is normal.  Neurological:     Mental Status: She is alert and oriented to person, place, and time.  Psychiatric:        Mood and Affect: Mood normal.        Behavior: Behavior normal.        Thought Content: Thought content normal.     Assessment and Plan: Bindi was seen today for follow-up.  Diagnoses and all orders for this visit:  GAD (generalized anxiety disorder) -     sertraline (ZOLOFT) 50 MG tablet; Take 1 tablet (50 mg total) by mouth daily.   Follow Up Instructions: See above   I discussed the assessment and treatment plan with the patient. The patient was provided an opportunity to ask questions and all were answered. The patient agreed with the plan and demonstrated an understanding of the instructions.   The patient was advised to call back or seek an in-person evaluation if the symptoms worsen or if the condition fails to improve as anticipated.  Herbert Seta, NP

## 2020-06-28 ENCOUNTER — Other Ambulatory Visit (HOSPITAL_COMMUNITY): Payer: Self-pay | Admitting: Obstetrics and Gynecology

## 2020-06-28 MED FILL — LETROZOLE 2.5 MG TABLET: 2.5 | 28 days supply | Qty: 15 | Fill #0

## 2020-06-28 MED FILL — OVIDREL 250 MCG/0.5 ML SYRG: 250 | 30 days supply | Qty: 1 | Fill #0

## 2020-07-16 ENCOUNTER — Telehealth: Payer: No Typology Code available for payment source | Admitting: Nurse Practitioner

## 2020-07-26 ENCOUNTER — Other Ambulatory Visit (HOSPITAL_COMMUNITY): Payer: Self-pay

## 2020-07-26 MED FILL — LETROZOLE 2.5 MG TABLET: 2.5 | 5 days supply | Qty: 15 | Fill #0

## 2020-07-29 ENCOUNTER — Other Ambulatory Visit (HOSPITAL_COMMUNITY): Payer: Self-pay | Admitting: Obstetrics and Gynecology

## 2020-07-31 ENCOUNTER — Other Ambulatory Visit (HOSPITAL_COMMUNITY): Payer: Self-pay

## 2020-07-31 MED FILL — Choriogonadotropin Alfa Inj 250 MCG/0.5ML: SUBCUTANEOUS | 30 days supply | Qty: 0.5 | Fill #0 | Status: AC

## 2020-08-02 ENCOUNTER — Other Ambulatory Visit (HOSPITAL_COMMUNITY): Payer: Self-pay

## 2020-08-03 ENCOUNTER — Other Ambulatory Visit (HOSPITAL_COMMUNITY): Payer: Self-pay

## 2020-08-04 ENCOUNTER — Other Ambulatory Visit (HOSPITAL_COMMUNITY): Payer: Self-pay

## 2020-08-31 ENCOUNTER — Other Ambulatory Visit (HOSPITAL_COMMUNITY): Payer: Self-pay

## 2020-08-31 MED ORDER — DOXYCYCLINE HYCLATE 100 MG PO CAPS
ORAL_CAPSULE | ORAL | 0 refills | Status: DC
  Filled 2020-08-31: qty 10, 5d supply, fill #0

## 2020-09-03 ENCOUNTER — Other Ambulatory Visit (HOSPITAL_COMMUNITY): Payer: Self-pay

## 2020-09-03 MED ORDER — CETROTIDE 0.25 MG ~~LOC~~ KIT
PACK | SUBCUTANEOUS | 1 refills | Status: DC
Start: 2020-09-03 — End: 2021-07-19
  Filled 2020-09-03: qty 5, 5d supply, fill #0

## 2020-09-03 MED ORDER — DOXYCYCLINE HYCLATE 100 MG PO TABS
100.0000 mg | ORAL_TABLET | Freq: Two times a day (BID) | ORAL | 0 refills | Status: DC
  Filled 2020-09-03: qty 40, 20d supply, fill #0

## 2020-09-03 MED ORDER — BD DISP NEEDLES 30G X 1/2" MISC
2 refills | Status: DC
Start: 2020-09-03 — End: 2021-07-21
  Filled 2020-09-03: qty 30, 15d supply, fill #0

## 2020-09-03 MED ORDER — EASY TOUCH HYPODERMIC NEEDLE 22G X 1-1/2" MISC
2 refills | Status: DC
Start: 2020-09-03 — End: 2021-07-21
  Filled 2020-09-03: qty 30, 15d supply, fill #0
  Filled 2020-11-16: qty 30, 15d supply, fill #1

## 2020-09-03 MED ORDER — PROGESTERONE 50 MG/ML IM OIL
TOPICAL_OIL | INTRAMUSCULAR | 2 refills | Status: DC
Start: 2020-09-03 — End: 2021-07-19
  Filled 2020-09-03: qty 30, 30d supply, fill #0
  Filled 2020-11-14: qty 30, 30d supply, fill #1
  Filled 2020-12-14: qty 30, 30d supply, fill #2

## 2020-09-03 MED ORDER — ESTRADIOL 0.1 MG/24HR TD PTTW
MEDICATED_PATCH | TRANSDERMAL | 3 refills | Status: DC
  Filled 2020-09-03: qty 8, 28d supply, fill #0
  Filled 2020-10-21: qty 8, 28d supply, fill #1
  Filled 2020-11-14: qty 8, 28d supply, fill #2

## 2020-09-03 MED ORDER — GONAL-F RFF REDIJECT 900 UNIT/1.5ML ~~LOC~~ SOPN
PEN_INJECTOR | SUBCUTANEOUS | 1 refills | Status: DC
Start: 2020-09-03 — End: 2021-07-19
  Filled 2020-09-03 – 2020-09-15 (×2): qty 4.5, 10d supply, fill #0

## 2020-09-03 MED ORDER — LEUPROLIDE ACETATE 1 MG/0.2ML IJ KIT
PACK | INTRAMUSCULAR | 1 refills | Status: DC
Start: 2020-09-03 — End: 2021-07-19
  Filled 2020-09-03: qty 1, 30d supply, fill #0

## 2020-09-03 MED ORDER — CHORIONIC GONADOTROPIN 10000 UNITS IM SOLR
INTRAMUSCULAR | 0 refills | Status: DC
Start: 2020-09-03 — End: 2021-07-19
  Filled 2020-09-03: qty 1, 30d supply, fill #0
  Filled 2020-09-07: qty 1, 10d supply, fill #0

## 2020-09-03 MED ORDER — ESTRADIOL 2 MG PO TABS
2.0000 mg | ORAL_TABLET | Freq: Two times a day (BID) | ORAL | 3 refills | Status: DC
Start: 2020-09-03 — End: 2021-07-19
  Filled 2020-09-03: qty 60, 30d supply, fill #0
  Filled 2020-11-04: qty 60, 30d supply, fill #1

## 2020-09-03 MED ORDER — MENOPUR 75 UNITS ~~LOC~~ SOLR
SUBCUTANEOUS | 1 refills | Status: DC
Start: 2020-09-03 — End: 2021-07-19
  Filled 2020-09-03: qty 15, 15d supply, fill #0
  Filled 2020-10-28: qty 15, 15d supply, fill #1

## 2020-09-03 MED ORDER — BD LUER-LOK SYRINGE 18G X 1-1/2" 3 ML MISC
2 refills | Status: DC
Start: 2020-09-03 — End: 2021-07-21
  Filled 2020-09-03: qty 60, 30d supply, fill #0
  Filled 2020-12-14: qty 60, 30d supply, fill #1

## 2020-09-03 MED ORDER — METHYLPREDNISOLONE 4 MG PO TABS
8.0000 mg | ORAL_TABLET | Freq: Two times a day (BID) | ORAL | 0 refills | Status: DC
Start: 2020-09-03 — End: 2021-07-19
  Filled 2020-09-03: qty 16, 4d supply, fill #0

## 2020-09-06 ENCOUNTER — Other Ambulatory Visit (HOSPITAL_COMMUNITY): Payer: Self-pay

## 2020-09-07 ENCOUNTER — Other Ambulatory Visit (HOSPITAL_COMMUNITY): Payer: Self-pay

## 2020-09-08 ENCOUNTER — Other Ambulatory Visit (HOSPITAL_COMMUNITY): Payer: Self-pay

## 2020-09-09 ENCOUNTER — Other Ambulatory Visit (HOSPITAL_COMMUNITY): Payer: Self-pay

## 2020-09-10 ENCOUNTER — Other Ambulatory Visit (HOSPITAL_COMMUNITY): Payer: Self-pay

## 2020-09-15 ENCOUNTER — Other Ambulatory Visit (HOSPITAL_COMMUNITY): Payer: Self-pay

## 2020-09-16 MED FILL — Sertraline HCl Tab 50 MG: ORAL | 90 days supply | Qty: 90 | Fill #0 | Status: AC

## 2020-09-17 ENCOUNTER — Other Ambulatory Visit (HOSPITAL_COMMUNITY): Payer: Self-pay

## 2020-09-21 ENCOUNTER — Other Ambulatory Visit (HOSPITAL_COMMUNITY): Payer: Self-pay

## 2020-09-21 MED ORDER — CETROTIDE 0.25 MG ~~LOC~~ KIT
PACK | SUBCUTANEOUS | 2 refills | Status: DC
Start: 2020-09-21 — End: 2021-07-19
  Filled 2020-09-21: qty 3, 3d supply, fill #0

## 2020-09-22 ENCOUNTER — Other Ambulatory Visit (HOSPITAL_COMMUNITY): Payer: Self-pay

## 2020-10-08 ENCOUNTER — Encounter: Payer: Self-pay | Admitting: Nurse Practitioner

## 2020-10-08 ENCOUNTER — Telehealth (INDEPENDENT_AMBULATORY_CARE_PROVIDER_SITE_OTHER): Payer: No Typology Code available for payment source | Admitting: Nurse Practitioner

## 2020-10-08 ENCOUNTER — Other Ambulatory Visit (HOSPITAL_COMMUNITY): Payer: Self-pay

## 2020-10-08 DIAGNOSIS — F411 Generalized anxiety disorder: Secondary | ICD-10-CM | POA: Diagnosis not present

## 2020-10-08 MED ORDER — SERTRALINE HCL 50 MG PO TABS
ORAL_TABLET | Freq: Every day | ORAL | 3 refills | Status: DC
  Filled 2020-10-08: qty 90, fill #0
  Filled 2020-12-23: qty 90, 90d supply, fill #0
  Filled 2021-04-03: qty 90, 90d supply, fill #1
  Filled 2021-07-21: qty 90, 90d supply, fill #2
  Filled 2021-10-07: qty 90, 90d supply, fill #3

## 2020-10-08 NOTE — Assessment & Plan Note (Signed)
Stable mood Maintain current medications

## 2020-10-08 NOTE — Progress Notes (Signed)
Virtual Visit via Video Note  I connected withNAME@ on 10/08/20 at  1:30 PM EDT by a video enabled telemedicine application and verified that I am speaking with the correct person using two identifiers.  Location: Patient:Home Provider: Office Participants: patient and provider  I discussed the limitations of evaluation and management by telemedicine and the availability of in person appointments. I also discussed with the patient that there may be a patient responsible charge related to this service. The patient expressed understanding and agreed to proceed.  XM:IWOEHOZ and depression f/up  History of Present Illness: GAD (generalized anxiety disorder) Stable mood Maintain current medications  Depression screen [facility] 2/9 10/08/2020 04/09/2020 01/27/2020  Decreased Interest 0 1 1  Down, Depressed, Hopeless 0 1 2  PHQ - 2 Score 0 2 3  Altered sleeping 1 0 2  Tired, decreased energy 1 0 2  Change in appetite 1 0 1  Feeling bad or failure about yourself  2 0 1  Trouble concentrating 1 1 2   Moving slowly or fidgety/restless 0 0 0  Suicidal thoughts 0 0 0  PHQ-9 Score 6 3 11   Difficult doing work/chores Somewhat difficult Somewhat difficult Somewhat difficult    GAD 7 : Generalized Anxiety Score 10/08/2020 06/18/2020 04/09/2020 01/27/2020  Nervous, Anxious, on Edge 1 2 1 1   Control/stop worrying 1 1 1 1   Worry too much - different things 1 1 1 1   Trouble relaxing 2 1 1 1   Restless 2 0 1 1  Easily annoyed or irritable 1 1 1 1   Afraid - awful might happen 2 1 2 2   Total GAD 7 Score 10 7 8 8   Anxiety Difficulty Not difficult at all Somewhat difficult Not difficult at all Somewhat difficult   Observations/Objective: Physical Exam Vitals reviewed.  Pulmonary:     Effort: Pulmonary effort is normal.  Neurological:     Mental Status: She is alert and oriented to person, place, and time.  Psychiatric:        Mood and Affect: Mood normal.        Behavior: Behavior normal.         Thought Content: Thought content normal.    Assessment and Plan: Darah was seen today for follow-up.  Diagnoses and all orders for this visit:  GAD (generalized anxiety disorder) -     sertraline (ZOLOFT) 50 MG tablet; TAKE 1 TABLET BY MOUTH ONCE A DAY  Follow Up Instructions: Maintain current medications   I discussed the assessment and treatment plan with the patient. The patient was provided an opportunity to ask questions and all were answered. The patient agreed with the plan and demonstrated an understanding of the instructions.   The patient was advised to call back or seek an in-person evaluation if the symptoms worsen or if the condition fails to improve as anticipated.  01/29/2020, NP

## 2020-10-20 ENCOUNTER — Other Ambulatory Visit (HOSPITAL_COMMUNITY): Payer: Self-pay

## 2020-10-20 MED ORDER — VITAMIN E 180 MG (400 UNIT) PO CAPS: 400.0000 [IU] | ORAL_CAPSULE | Freq: Every day | ORAL | 6 refills | Status: DC

## 2020-10-20 MED ORDER — PENTOXIFYLLINE ER 400 MG PO TBCR
400.0000 mg | EXTENDED_RELEASE_TABLET | Freq: Three times a day (TID) | ORAL | 1 refills | Status: DC
Start: 2020-10-20 — End: 2021-07-19
  Filled 2020-10-20: qty 45, 15d supply, fill #0

## 2020-10-21 ENCOUNTER — Other Ambulatory Visit (HOSPITAL_COMMUNITY): Payer: Self-pay

## 2020-10-28 ENCOUNTER — Other Ambulatory Visit (HOSPITAL_COMMUNITY): Payer: Self-pay

## 2020-11-04 ENCOUNTER — Other Ambulatory Visit (HOSPITAL_COMMUNITY): Payer: Self-pay

## 2020-11-15 ENCOUNTER — Other Ambulatory Visit (HOSPITAL_COMMUNITY): Payer: Self-pay

## 2020-11-16 ENCOUNTER — Other Ambulatory Visit (HOSPITAL_COMMUNITY): Payer: Self-pay

## 2020-12-14 ENCOUNTER — Other Ambulatory Visit (HOSPITAL_COMMUNITY): Payer: Self-pay

## 2020-12-15 ENCOUNTER — Other Ambulatory Visit (HOSPITAL_COMMUNITY): Payer: Self-pay

## 2020-12-15 MED ORDER — PROGESTERONE 200 MG PO CAPS
200.0000 mg | ORAL_CAPSULE | ORAL | 0 refills | Status: DC
  Filled 2020-12-15: qty 14, 14d supply, fill #0

## 2020-12-24 ENCOUNTER — Other Ambulatory Visit (HOSPITAL_COMMUNITY): Payer: Self-pay

## 2020-12-30 ENCOUNTER — Other Ambulatory Visit (HOSPITAL_COMMUNITY): Payer: Self-pay

## 2020-12-30 LAB — OB RESULTS CONSOLE HIV ANTIBODY (ROUTINE TESTING): HIV: NONREACTIVE

## 2020-12-30 LAB — RESULTS CONSOLE HPV: CHL HPV: NEGATIVE

## 2020-12-30 LAB — HM PAP SMEAR

## 2020-12-30 LAB — OB RESULTS CONSOLE RUBELLA ANTIBODY, IGM: Rubella: IMMUNE

## 2020-12-30 LAB — OB RESULTS CONSOLE GC/CHLAMYDIA
Chlamydia: NEGATIVE
Gonorrhea: NEGATIVE

## 2020-12-30 LAB — OB RESULTS CONSOLE HEPATITIS B SURFACE ANTIGEN: Hepatitis B Surface Ag: NEGATIVE

## 2020-12-30 MED ORDER — ALBUTEROL SULFATE HFA 108 (90 BASE) MCG/ACT IN AERS
INHALATION_SPRAY | RESPIRATORY_TRACT | 6 refills | Status: DC
Start: 2020-12-30 — End: 2023-03-13
  Filled 2020-12-30: qty 36, 33d supply, fill #0

## 2021-01-07 ENCOUNTER — Other Ambulatory Visit (HOSPITAL_COMMUNITY): Payer: Self-pay

## 2021-01-07 ENCOUNTER — Other Ambulatory Visit (HOSPITAL_BASED_OUTPATIENT_CLINIC_OR_DEPARTMENT_OTHER): Payer: Self-pay

## 2021-01-13 ENCOUNTER — Other Ambulatory Visit (HOSPITAL_COMMUNITY): Payer: Self-pay

## 2021-01-13 MED ORDER — FLUTICASONE-SALMETEROL 250-50 MCG/ACT IN AEPB
INHALATION_SPRAY | RESPIRATORY_TRACT | 4 refills | Status: DC
Start: 2021-01-13 — End: 2022-07-11
  Filled 2021-01-13: qty 120, 60d supply, fill #0
  Filled 2021-07-21 – 2021-08-03 (×2): qty 120, 60d supply, fill #1

## 2021-04-04 ENCOUNTER — Other Ambulatory Visit (HOSPITAL_COMMUNITY): Payer: Self-pay

## 2021-05-01 NOTE — L&D Delivery Note (Signed)
Neonatology Note:  ? ?Attendance at C-section: ? ?  I was asked by Dr. Terri Piedra to attend this C/S at term for FTP. The mother,  with good prenatal care complicated by IVF, history of anxiety/depression, anemia of pregnancy , asthma and remote h/o HSV.  She had pregestational genetic testing.  AFP neg. GBS neg. ROM 32h 35m prior to delivery, fluid clear. Infant vigorous with good spontaneous cry and tone. +60 sec DCC  done. Needed minimal bulb suctioning.  Lungs coarse to ausc, good tone and pink initially then became dusky in DR.  Pulse ox placed and Sao2 low.  BBO2 begun with brief CPAP to recruit lungs; very good response to pressure.  Lungs cleared, pink, SaO2 90s.  Weaned to RA with stable vitals and comfortable WOB.  Apgars 7/8. Father at bedside and mother updated. To MBU in care of Pediatrician.   ? ?Monia Sabal. Katherina Mires, MD ? ?

## 2021-05-05 ENCOUNTER — Non-Acute Institutional Stay (HOSPITAL_COMMUNITY)
Admission: RE | Admit: 2021-05-05 | Discharge: 2021-05-05 | Disposition: A | Discharge status: Home / Self Care | Payer: No Typology Code available for payment source | Source: Ambulatory Visit | Attending: Internal Medicine | Admitting: Internal Medicine

## 2021-05-05 ENCOUNTER — Other Ambulatory Visit: Payer: Self-pay

## 2021-05-05 DIAGNOSIS — Z3A00 Weeks of gestation of pregnancy not specified: Secondary | ICD-10-CM | POA: Insufficient documentation

## 2021-05-05 DIAGNOSIS — O99019 Anemia complicating pregnancy, unspecified trimester: Secondary | ICD-10-CM | POA: Diagnosis not present

## 2021-05-05 MED ORDER — SODIUM CHLORIDE 0.9 % IV SOLN
510.0000 mg | Freq: Once | INTRAVENOUS | Status: AC
  Administered 2021-05-05: 510 mg via INTRAVENOUS
  Filled 2021-05-05: qty 510

## 2021-05-05 MED ORDER — SODIUM CHLORIDE 0.9 % IV SOLN: INTRAVENOUS | Status: DC | PRN

## 2021-05-05 NOTE — Progress Notes (Signed)
PATIENT CARE CENTER NOTE   Diagnosis: Anemia complicating pregnancy, unspecified trimester    Provider: Carrington Clamp, MD   Procedure: Feraheme infusion    Note:  Patient received Feraheme infusion (dose 1 of 1) via PIV. Tolerated infusion well with no adverse reaction. Observed patient for 30 minutes post infusion. Vital signs stable. Discharge instructions given. Patient alert, oriented and ambulatory at discharge.

## 2021-06-09 ENCOUNTER — Other Ambulatory Visit (HOSPITAL_COMMUNITY): Payer: Self-pay

## 2021-06-09 MED ORDER — FERROUS SULFATE 325 (65 FE) MG PO TABS: ORAL_TABLET | ORAL | 4 refills | Status: DC

## 2021-06-20 ENCOUNTER — Non-Acute Institutional Stay (HOSPITAL_COMMUNITY)
Admission: RE | Admit: 2021-06-20 | Discharge: 2021-06-20 | Disposition: A | Discharge status: Home / Self Care | Payer: No Typology Code available for payment source | Source: Ambulatory Visit | Attending: Internal Medicine | Admitting: Internal Medicine

## 2021-06-20 ENCOUNTER — Other Ambulatory Visit: Payer: Self-pay

## 2021-06-20 DIAGNOSIS — Z3A35 35 weeks gestation of pregnancy: Secondary | ICD-10-CM | POA: Diagnosis not present

## 2021-06-20 DIAGNOSIS — O99013 Anemia complicating pregnancy, third trimester: Secondary | ICD-10-CM | POA: Diagnosis present

## 2021-06-20 MED ORDER — SODIUM CHLORIDE 0.9 % IV SOLN: INTRAVENOUS | Status: DC | PRN

## 2021-06-20 MED ORDER — SODIUM CHLORIDE 0.9 % IV SOLN
510.0000 mg | Freq: Once | INTRAVENOUS | Status: AC
  Administered 2021-06-20: 510 mg via INTRAVENOUS
  Filled 2021-06-20: qty 510

## 2021-06-20 NOTE — Progress Notes (Signed)
PATIENT CARE CENTER NOTE     Diagnosis: Anemia complicating pregnancy, unspecified trimester      Provider: Irene Pap, MD     Procedure: Feraheme infusion      Note:  Patient received Feraheme infusion (dose 1 of 1) via PIV. Patient also received one time dose of Feraheme in January.  Tolerated infusion well with no adverse reaction. Observed patient for 30 minutes post infusion. Vital signs stable. AVS offered but patient refused. Patient alert, oriented and ambulatory at discharge. Discharged home with husband.

## 2021-07-01 LAB — OB RESULTS CONSOLE GBS: GBS: NEGATIVE

## 2021-07-17 ENCOUNTER — Encounter (HOSPITAL_COMMUNITY): Payer: Self-pay

## 2021-07-17 ENCOUNTER — Other Ambulatory Visit: Payer: Self-pay

## 2021-07-17 ENCOUNTER — Inpatient Hospital Stay (HOSPITAL_COMMUNITY)
Admission: AD | Admit: 2021-07-17 | Discharge: 2021-07-21 | DRG: 788 | Disposition: A | Discharge status: Home / Self Care | Payer: No Typology Code available for payment source | Attending: Obstetrics and Gynecology | Admitting: Obstetrics and Gynecology

## 2021-07-17 DIAGNOSIS — O3663X0 Maternal care for excessive fetal growth, third trimester, not applicable or unspecified: Secondary | ICD-10-CM | POA: Diagnosis present

## 2021-07-17 DIAGNOSIS — O9952 Diseases of the respiratory system complicating childbirth: Secondary | ICD-10-CM | POA: Diagnosis present

## 2021-07-17 DIAGNOSIS — Z98891 History of uterine scar from previous surgery: Secondary | ICD-10-CM

## 2021-07-17 DIAGNOSIS — F419 Anxiety disorder, unspecified: Secondary | ICD-10-CM | POA: Diagnosis present

## 2021-07-17 DIAGNOSIS — O9902 Anemia complicating childbirth: Secondary | ICD-10-CM | POA: Diagnosis present

## 2021-07-17 DIAGNOSIS — O99344 Other mental disorders complicating childbirth: Secondary | ICD-10-CM | POA: Diagnosis present

## 2021-07-17 DIAGNOSIS — Z3A39 39 weeks gestation of pregnancy: Secondary | ICD-10-CM | POA: Diagnosis not present

## 2021-07-17 DIAGNOSIS — O26893 Other specified pregnancy related conditions, third trimester: Secondary | ICD-10-CM | POA: Diagnosis present

## 2021-07-17 DIAGNOSIS — J45909 Unspecified asthma, uncomplicated: Secondary | ICD-10-CM | POA: Diagnosis present

## 2021-07-17 LAB — PROTEIN / CREATININE RATIO, URINE
Creatinine, Urine: 72.43 mg/dL
Protein Creatinine Ratio: 0.21 mg/mg{Cre} — ABNORMAL HIGH (ref 0.00–0.15)
Total Protein, Urine: 15 mg/dL

## 2021-07-17 LAB — AMNISURE RUPTURE OF MEMBRANE (ROM) NOT AT ARMC: Amnisure ROM: POSITIVE

## 2021-07-17 NOTE — MAU Note (Addendum)
Valerie Graham is a 31 y.o. at [redacted]w[redacted]d here in MAU reporting: G1P0 IVF pregnancy arrived with complaints of SROM at 2130. Felt large gush and clear fluid continued down her legs with standing. Pt denies vaginal bleeding or bloody show. Endorses + fetal movement. ?GBS - ?HSV- per pt ?Onset of complaint:2130 ?Pain score:2 ?Vitals:  ? 07/17/21 2205  ?BP: (!) 137/94  ?Pulse: 96  ?Resp: 17  ?Temp: 98.1 ?F (36.7 ?C)  ?SpO2: 100%  ?   ?FHT:155bpm ? ?Lab orders placed from triage:  mau labor ? ? ?Repeat BP:  136/97 ? ?

## 2021-07-17 NOTE — MAU Provider Note (Signed)
Patient Valerie Graham is a 31 y.o. G1P0000 ?At 55w2dhere with complaints of water running down her legs after about 9:30 pm. She denies contractions, VB, SOB, fever. She denies HA, blurry vision, floating spots in her vision. She had a dull headache earlier but it is gone now.  ? ? ?History  ?  ? ?CSN: [id number]? ?Arrival date and time: 07/17/21 2149 ? ? None  ?  ? ?Chief Complaint  ?Patient presents with  ? Rupture of Membranes  ? ?Vaginal Discharge ?The patient's primary symptoms include vaginal discharge. The patient's pertinent negatives include no vaginal bleeding. The current episode started today. Pertinent negatives include no constipation, diarrhea, dysuria, fever, urgency or vomiting.  ? ?OB History   ? ? Gravida  ?1  ? Para  ?0  ? Term  ?0  ? Preterm  ?0  ? AB  ?0  ? Living  ?0  ?  ? ? SAB  ?0  ? IAB  ?0  ? Ectopic  ?0  ? Multiple  ?0  ? Live Births  ?   ?   ?  ?  ? ? ?Past Medical History:  ?Diagnosis Date  ? Anxiety   ? Chicken pox   ? Depression   ? Family history of ovarian cancer   ? Family history of prostate cancer   ? Family history of skin cancer   ? Herpes simplex without mention of complication 32/8/36 ? ? ?Past Surgical History:  ?Procedure Laterality Date  ? TONSILLECTOMY  2014  ? WISDOM TOOTH EXTRACTION    ? ? ?Family History  ?Problem Relation Age of Onset  ? Cancer Paternal Grandfather   ? Cancer Maternal Grandmother 670 ?     breast  ? Cancer Father 435 ?     bladder/prostate metastatic, neg genetic testing 2019  ? Endometriosis Mother 321 ?     hysterectomy  ? Diabetes Maternal Aunt   ? Cancer Paternal Uncle 576 ?     prostate  ? Stroke Paternal Uncle   ? Cancer Paternal Aunt   ?     ovarian  ? Cancer Other   ?     colon cancer  ? ? ?Social History  ? ?Tobacco Use  ? Smoking status: Never  ? Smokeless tobacco: Never  ?Vaping Use  ? Vaping Use: Never used  ?Substance Use Topics  ? Alcohol use: No  ? Drug use: No  ? ? ?Allergies:  ?Allergies  ?Allergen Reactions  ? Penicillins Other (See  Comments)  ?  Reaction occurred as a child and pt is uncertain what the reaction was  ? ? ?Medications Prior to Admission  ?Medication Sig Dispense Refill Last Dose  ? Cetrorelix Acetate (CETROTIDE) 0.25 MG KIT Reconstitute and inject 1 syringe daily 5 kit 1 07/17/2021  ? ferrous sulfate 325 (65 FE) MG tablet Take 1 tablet by mouth once a day 30 tablet 4 07/17/2021  ? fluticasone-salmeterol (ADVAIR) 250-50 MCG/ACT AEPB Inhale 1 puff by mouth into the lungs daily 120 each 4 07/17/2021  ? sertraline (ZOLOFT) 50 MG tablet TAKE 1 TABLET BY MOUTH ONCE A DAY 90 tablet 3 07/17/2021  ? vitamin E (VITAMIN E-400) 180 MG (400 UNITS) capsule Take 1 capsule (400 Units total) by mouth daily. 30 capsule 6 07/17/2021  ? albuterol (PROAIR HFA) 108 (90 Base) MCG/ACT inhaler Inhale 2 puffs every 4 hours as directed. 36 g 6   ? Cetrorelix Acetate (CETROTIDE) 0.25 MG KIT  Reconstitute and inject 1 syringe subcutaneous daily 3 kit 2   ? estradiol (ESTRACE) 2 MG tablet Take 1 tablet (2 mg total) by mouth 2 (two) times daily. 60 tablet 3   ? estradiol (VIVELLE-DOT) 0.1 MG/24HR patch Place 1 patch onto the skin every 3 days 8 patch 3   ? Follitropin Alfa (GONAL-F RFF REDIJECT) 900 UNIT/1.5ML SOPN Inject 262 units subcutaneously daily or as directed 4.5 mL 1   ? human chorionic gonadotropin (PREGNYL/NOVAREL) 10000 units injection Mix with 1 ml diluent and inject at exact time given as directed 1 each 0   ? letrozole (FEMARA) 2.5 MG tablet TAKE 3 TABLETS BY MOUTH ONCE A DAY ON CYCLE DAYS 3-7 15 tablet 3   ? leuprolide (LUPRON) 1 MG/0.2ML injection Inject 80 units  for 1 dose 1 kit 1   ? Menotropins (MENOPUR) 75 units SOLR Inject daily as directed 15 each 1   ? methylPREDNISolone (MEDROL) 4 MG tablet Take 2 tablets (8 mg total) by mouth 2 (two) times daily. 16 tablet 0   ? NEEDLE, DISP, 22 G (EASY TOUCH HYPODERMIC NEEDLE) 22G X 1-1/2" MISC Use as directed 30 each 2   ? NEEDLE, DISP, 30 G (BD DISP NEEDLES) 30G X 1/2" MISC Use as directed 30 each 2    ? pentoxifylline (TRENTAL) 400 MG CR tablet Take 1 tablet (400 mg total) by mouth 3 (three) times daily. 45 tablet 1   ? progesterone (PROMETRIUM) 200 MG capsule Take 1 capsule (200 mg total) by mouth at bedtime. 14 capsule 0   ? progesterone 50 MG/ML injection Inject 32m into the muscle daily 30 mL 2   ? SYRINGE-NEEDLE, DISP, 3 ML (B-D 3CC LUER-LOK SYR 18GX1-1/2) 18G X 1-1/2" 3 ML MISC Use as directed 60 each 2   ? ? ?Review of Systems  ?Constitutional: Negative.  Negative for fever.  ?HENT: Negative.    ?Respiratory: Negative.    ?Cardiovascular: Negative.   ?Gastrointestinal:  Negative for constipation, diarrhea and vomiting.  ?Genitourinary:  Positive for vaginal discharge. Negative for dysuria and urgency.  ?Neurological: Negative.   ?Physical Exam  ? ?Blood pressure (!) 126/91, pulse 81, temperature 98.1 ?F (36.7 ?C), temperature source Oral, resp. rate 17, height _0  (1.702 m), SpO2 98 %. ? ?Physical Exam ?Constitutional:   ?   Appearance: Normal appearance.  ?Cardiovascular:  ?   Rate and Rhythm: Normal rate.  ?Pulmonary:  ?   Effort: Pulmonary effort is normal.  ?Abdominal:  ?   General: Abdomen is flat.  ?Neurological:  ?   General: No focal deficit present.  ?   Mental Status: She is alert.  ?Psychiatric:     ?   Mood and Affect: Mood normal.     ?   Behavior: Behavior normal.  ? ? ?MAU Course  ?Procedures ? ?MDM ? ?Patient Vitals for the past 24 hrs: ? BP Temp Temp src Pulse Resp SpO2 Height  ?07/17/21 2330 (!) 126/91 -- -- 81 -- 98 % --  ?07/17/21 2315 (!) 131/97 -- -- 91 -- 97 % --  ?07/17/21 2300 (!) 132/93 -- -- 85 -- 97 % --  ?07/17/21 2245 134/90 -- -- 93 -- 97 % --  ?07/17/21 2229 (!) 135/93 -- -- 91 -- 99 % --  ?07/17/21 2226 (!) 136/99 -- -- 86 -- -- --  ?07/17/21 2205 (!) 137/94 98.1 ?F (36.7 ?C) Oral 96 17 100 % _1  (1.702 m)  ? ? ?-BP now elevated; she denies  history of blood pressure issues in the past ?-amnisure positive; labs pending.  ? ?Assessment and Plan  ?-TC to Dr. Rogue Bussing,  patients vitals, ctx, lab work reviewed ?-Dr. Rogue Bussing to place orders and admit to L and D ? ?Mervyn Skeeters Shakena Callari ?07/17/2021, 11:42 PM  ?

## 2021-07-18 ENCOUNTER — Encounter (HOSPITAL_COMMUNITY): Payer: Self-pay | Admitting: Obstetrics and Gynecology

## 2021-07-18 ENCOUNTER — Inpatient Hospital Stay (HOSPITAL_COMMUNITY): Payer: No Typology Code available for payment source | Admitting: Anesthesiology

## 2021-07-18 DIAGNOSIS — Z3A39 39 weeks gestation of pregnancy: Secondary | ICD-10-CM

## 2021-07-18 LAB — COMPREHENSIVE METABOLIC PANEL
ALT: 22 U/L (ref 0–44)
AST: 33 U/L (ref 15–41)
Albumin: 2.3 g/dL — ABNORMAL LOW (ref 3.5–5.0)
Alkaline Phosphatase: 299 U/L — ABNORMAL HIGH (ref 38–126)
Anion gap: 11 (ref 5–15)
BUN: 9 mg/dL (ref 6–20)
CO2: 21 mmol/L — ABNORMAL LOW (ref 22–32)
Calcium: 9.3 mg/dL (ref 8.9–10.3)
Chloride: 105 mmol/L (ref 98–111)
Creatinine, Ser: 0.86 mg/dL (ref 0.44–1.00)
GFR, Estimated: >60 mL/min (ref >60)
Glucose, Bld: 81 mg/dL (ref 70–99)
Potassium: 4 mmol/L (ref 3.5–5.1)
Sodium: 137 mmol/L (ref 135–145)
Total Bilirubin: 0.5 mg/dL (ref 0.3–1.2)
Total Protein: 5.9 g/dL — ABNORMAL LOW (ref 6.5–8.1)

## 2021-07-18 LAB — CBC
HCT: 31.6 % — ABNORMAL LOW (ref 36.0–46.0)
Hemoglobin: 9.8 g/dL — ABNORMAL LOW (ref 12.0–15.0)
MCH: 26.1 pg (ref 26.0–34.0)
MCHC: 31 g/dL (ref 30.0–36.0)
MCV: 84.3 fL (ref 80.0–100.0)
Platelets: 210 10*3/uL (ref 150–400)
RBC: 3.75 MIL/uL — ABNORMAL LOW (ref 3.87–5.11)
RDW: 19.1 % — ABNORMAL HIGH (ref 11.5–15.5)
WBC: 7.6 10*3/uL (ref 4.0–10.5)
nRBC: 0 % (ref 0.0–0.2)

## 2021-07-18 LAB — RPR
RPR Ser Ql: REACTIVE — AB
RPR Titer: 1:8 {titer}

## 2021-07-18 LAB — TYPE AND SCREEN
ABO/RH(D): O POS
Antibody Screen: NEGATIVE

## 2021-07-18 LAB — T.PALLIDUM AB, TOTAL: T Pallidum Abs: NONREACTIVE

## 2021-07-18 MED ORDER — FENTANYL-BUPIVACAINE-NACL 0.5-0.125-0.9 MG/250ML-% EP SOLN
12.0000 mL/h | EPIDURAL | Status: DC | PRN
  Administered 2021-07-18 – 2021-07-19 (×3): 12 mL/h via EPIDURAL
  Filled 2021-07-18 (×3): qty 250

## 2021-07-18 MED ORDER — LACTATED RINGERS IV SOLN: INTRAVENOUS | Status: DC

## 2021-07-18 MED ORDER — TERBUTALINE SULFATE 1 MG/ML IJ SOLN: 0.2500 mg | Freq: Once | INTRAMUSCULAR | Status: DC | PRN

## 2021-07-18 MED ORDER — OXYTOCIN BOLUS FROM INFUSION: 333.0000 mL | Freq: Once | INTRAVENOUS | Status: DC

## 2021-07-18 MED ORDER — EPHEDRINE 5 MG/ML INJ: 10.0000 mg | INTRAVENOUS | Status: DC | PRN

## 2021-07-18 MED ORDER — OXYTOCIN-SODIUM CHLORIDE 30-0.9 UT/500ML-% IV SOLN: 2.5000 [IU]/h | INTRAVENOUS | Status: DC

## 2021-07-18 MED ORDER — PHENYLEPHRINE 40 MCG/ML (10ML) SYRINGE FOR IV PUSH (FOR BLOOD PRESSURE SUPPORT)
80.0000 ug | PREFILLED_SYRINGE | INTRAVENOUS | Status: DC | PRN
  Filled 2021-07-18: qty 10

## 2021-07-18 MED ORDER — LIDOCAINE HCL (PF) 1 % IJ SOLN
INTRAMUSCULAR | Status: DC | PRN
  Administered 2021-07-18 (×2): 4 mL via EPIDURAL

## 2021-07-18 MED ORDER — OXYTOCIN-SODIUM CHLORIDE 30-0.9 UT/500ML-% IV SOLN
1.0000 m[IU]/min | INTRAVENOUS | Status: DC
  Administered 2021-07-18: 2 m[IU]/min via INTRAVENOUS
  Filled 2021-07-18: qty 500

## 2021-07-18 MED ORDER — PHENYLEPHRINE 40 MCG/ML (10ML) SYRINGE FOR IV PUSH (FOR BLOOD PRESSURE SUPPORT): 80.0000 ug | PREFILLED_SYRINGE | INTRAVENOUS | Status: DC | PRN

## 2021-07-18 MED ORDER — OXYCODONE-ACETAMINOPHEN 5-325 MG PO TABS: 2.0000 | ORAL_TABLET | ORAL | Status: DC | PRN

## 2021-07-18 MED ORDER — FENTANYL CITRATE (PF) 100 MCG/2ML IJ SOLN: 50.0000 ug | INTRAMUSCULAR | Status: DC | PRN

## 2021-07-18 MED ORDER — FENTANYL CITRATE (PF) 100 MCG/2ML IJ SOLN
50.0000 ug | Freq: Once | INTRAMUSCULAR | Status: AC
  Administered 2021-07-18: 50 ug via INTRAVENOUS
  Filled 2021-07-18: qty 2

## 2021-07-18 MED ORDER — ACETAMINOPHEN 325 MG PO TABS: 650.0000 mg | ORAL_TABLET | ORAL | Status: DC | PRN

## 2021-07-18 MED ORDER — LIDOCAINE HCL (PF) 1 % IJ SOLN: 30.0000 mL | INTRAMUSCULAR | Status: DC | PRN

## 2021-07-18 MED ORDER — LACTATED RINGERS IV SOLN: 500.0000 mL | INTRAVENOUS | Status: DC | PRN

## 2021-07-18 MED ORDER — ONDANSETRON HCL 4 MG/2ML IJ SOLN: 4.0000 mg | Freq: Four times a day (QID) | INTRAMUSCULAR | Status: DC | PRN

## 2021-07-18 MED ORDER — SOD CITRATE-CITRIC ACID 500-334 MG/5ML PO SOLN
30.0000 mL | ORAL | Status: DC | PRN
  Administered 2021-07-18 – 2021-07-19 (×2): 30 mL via ORAL
  Filled 2021-07-18 (×2): qty 30

## 2021-07-18 MED ORDER — OXYCODONE-ACETAMINOPHEN 5-325 MG PO TABS: 1.0000 | ORAL_TABLET | ORAL | Status: DC | PRN

## 2021-07-18 MED ORDER — FLEET ENEMA 7-19 GM/118ML RE ENEM: 1.0000 | ENEMA | RECTAL | Status: DC | PRN

## 2021-07-18 MED ORDER — DIPHENHYDRAMINE HCL 50 MG/ML IJ SOLN
12.5000 mg | INTRAMUSCULAR | Status: DC | PRN
  Administered 2021-07-18: 12.5 mg via INTRAVENOUS
  Filled 2021-07-18: qty 1

## 2021-07-18 MED ORDER — DIPHENHYDRAMINE HCL 50 MG/ML IJ SOLN
25.0000 mg | Freq: Once | INTRAMUSCULAR | Status: AC
  Administered 2021-07-18: 25 mg via INTRAVENOUS
  Filled 2021-07-18: qty 1

## 2021-07-18 MED ORDER — LACTATED RINGERS IV SOLN
500.0000 mL | Freq: Once | INTRAVENOUS | Status: AC
  Administered 2021-07-18: 500 mL via INTRAVENOUS

## 2021-07-18 NOTE — Progress Notes (Signed)
Patient ID: Valerie Graham, female   DOB: Dec 07, 1990, 31 y.o.   MRN: [id number] ?Pt doing well.  ?SVE 9.5/100/-1 ?Cat 1 strip 140 ?Ctxs a 1-3 mins  ?IUPC placed ?Continue with expectant mgmt  ?

## 2021-07-18 NOTE — H&P (Addendum)
Valerie Graham is a 31 y.o. prime female presenting at 74 3/7wks with SROM. She is dated per IVF. Pregnancy has been complicated by history of anxiety, anemia of pregnancy , asthma. ?She had pregestational genetic testing.  AFP neg. GBS neg ? ?OB History   ? ? Gravida  ?1  ? Para  ?0  ? Term  ?0  ? Preterm  ?0  ? AB  ?0  ? Living  ?0  ?  ? ? SAB  ?0  ? IAB  ?0  ? Ectopic  ?0  ? Multiple  ?0  ? Live Births  ?   ?   ?  ?  ? ?Past Medical History:  ?Diagnosis Date  ? Anxiety   ? Chicken pox   ? Depression   ? Family history of ovarian cancer   ? Family history of prostate cancer   ? Family history of skin cancer   ? Herpes simplex without mention of complication 07/02/11  ? ?Past Surgical History:  ?Procedure Laterality Date  ? TONSILLECTOMY  2014  ? WISDOM TOOTH EXTRACTION    ? ?Family History: family history includes Cancer in her paternal aunt, paternal grandfather, and another family member; Cancer (age of onset: 18) in her father; Cancer (age of onset: 34) in her paternal uncle; Cancer (age of onset: 8) in her maternal grandmother; Diabetes in her maternal aunt; Endometriosis (age of onset: 32) in her mother; Stroke in her paternal uncle. ?Social History:  reports that she has never smoked. She has never used smokeless tobacco. She reports that she does not currently use alcohol. She reports that she does not use drugs. ? ? ?  ?Maternal Diabetes: No ?Genetic Screening: Normal ?Maternal Ultrasounds/Referrals: Normal ?Fetal Ultrasounds or other Referrals:  None ?Maternal Substance Abuse:  No ?Significant Maternal Medications:  None ?Significant Maternal Lab Results:  Group B Strep negative ?Other Comments:  None ? ?Review of Systems  ?Constitutional:  Positive for activity change. Negative for fatigue.  ?Eyes:  Negative for photophobia and visual disturbance.  ?Respiratory:  Negative for chest tightness and shortness of breath.   ?Cardiovascular:  Positive for leg swelling. Negative for chest pain and palpitations.   ?Gastrointestinal:  Positive for abdominal pain.  ?Musculoskeletal:  Positive for back pain.  ?Neurological:  Negative for light-headedness and headaches.  ?Psychiatric/Behavioral:  The patient is nervous/anxious.   ?Maternal Medical History:  ?Reason for admission: Rupture of membranes.  ? ?Contractions: Frequency: irregular.   ?Perceived severity is mild.   ?Fetal activity: Perceived fetal activity is normal.   ?Prenatal complications: no prenatal complications ?Prenatal Complications - Diabetes: none. ? ?Dilation: 4.5 ?Effacement (%): 80 ?Station: -2 ?Exam by:: L. Manson Passey, RN ?Blood pressure 102/69, pulse 81, temperature 98.5 ?F (36.9 ?C), temperature source Oral, resp. rate 18, height 5\' 7"  (1.702 m), weight 93.4 kg, SpO2 99 %. ?Maternal Exam:  ?Uterine Assessment: Contraction strength is mild.  Contraction frequency is irregular.  ?Abdomen: Patient reports generalized tenderness.  ?Estimated fetal weight is AGA.   ?Fetal presentation: vertex ?Introitus: Normal vulva. Vulva is negative for condylomata and lesion.  ?Normal vagina.  Vagina is negative for condylomata.  ?Amniotic fluid character: clear. ?Pelvis: adequate for delivery.   ?Cervix: Cervix evaluated by sterile speculum exam.   ? ? ?Fetal Exam ?Fetal Monitor Review: Baseline rate: 135.  ?Variability: minimal (<5 bpm).   ?Pattern: accelerations present and no decelerations.   ? ?Physical Exam ?Vitals and nursing note reviewed. Exam conducted with a chaperone present.  ?Constitutional:   ?  Appearance: Normal appearance. She is normal weight.  ?Pulmonary:  ?   Effort: Pulmonary effort is normal.  ?Abdominal:  ?   Palpations: Abdomen is soft.  ?   Tenderness: There is generalized abdominal tenderness.  ?Genitourinary: ?   General: Normal vulva.  ?Vulva is no lesion.  ?Musculoskeletal:     ?   General: Normal range of motion.  ?   Cervical back: Normal range of motion.  ?Skin: ?   General: Skin is warm.  ?   Capillary Refill: Capillary refill takes 2 to 3  seconds.  ?Neurological:  ?   General: No focal deficit present.  ?   Mental Status: She is alert and oriented to person, place, and time. Mental status is at baseline.  ?Psychiatric:     ?   Mood and Affect: Mood normal.     ?   Behavior: Behavior normal.     ?   Thought Content: Thought content normal.     ?   Judgment: Judgment normal.  ?  ?Prenatal labs: ?ABO, Rh: --/--/O POS (03/19 2350) ?Antibody: NEG (03/19 2350) ?Rubella:   ?RPR: Reactive (03/19 2350)  ?HBsAg:    ?HIV:    ?GBS:    ? ?Assessment/Plan: ?31yo prime at 65 3/[redacted]wks gestation with IVF pregnancy, SROM ?- Admitted ?- Pain control  ?- Continue to augment with pitocin ; now at ?- Anticipate svd ?- GBS neg  ? ?Cathrine Muster ?07/18/2021, 10:20 AM ? ? ? ? ?

## 2021-07-18 NOTE — Anesthesia Preprocedure Evaluation (Addendum)
Anesthesia Evaluation  Patient identified by MRN, date of birth, ID band Patient awake    Reviewed: Allergy & Precautions, Patient's Chart, lab work & pertinent test results  History of Anesthesia Complications Negative for: history of anesthetic complications  Airway Mallampati: II  TM Distance: >3 FB Neck ROM: Full    Dental no notable dental hx.    Pulmonary neg pulmonary ROS,    Pulmonary exam normal        Cardiovascular negative cardio ROS Normal cardiovascular exam     Neuro/Psych Anxiety Depression negative neurological ROS     GI/Hepatic negative GI ROS, Neg liver ROS,   Endo/Other  negative endocrine ROS  Renal/GU negative Renal ROS  negative genitourinary   Musculoskeletal negative musculoskeletal ROS (+)   Abdominal   Peds  Hematology negative hematology ROS (+)   Anesthesia Other Findings Day of surgery medications reviewed with patient.  Reproductive/Obstetrics (+) Pregnancy                             Anesthesia Physical Anesthesia Plan  ASA: 2  Anesthesia Plan: Epidural   Post-op Pain Management:    Induction:   PONV Risk Score and Plan: Treatment may vary due to age or medical condition  Airway Management Planned: Natural Airway  Additional Equipment: Fetal Monitoring  Intra-op Plan:   Post-operative Plan:   Informed Consent: I have reviewed the patients History and Physical, chart, labs and discussed the procedure including the risks, benefits and alternatives for the proposed anesthesia with the patient or authorized representative who has indicated his/her understanding and acceptance.       Plan Discussed with:   Anesthesia Plan Comments:         Anesthesia Quick Evaluation  

## 2021-07-18 NOTE — Anesthesia Procedure Notes (Signed)
Epidural ?Patient location during procedure: OB ?Start time: 07/18/2021 4:20 AM ?End time: 07/18/2021 4:23 AM ? ?Staffing ?Anesthesiologist: Kaylyn Layer, MD ?Performed: anesthesiologist  ? ?Preanesthetic Checklist ?Completed: patient identified, IV checked, risks and benefits discussed, monitors and equipment checked, pre-op evaluation and timeout performed ? ?Epidural ?Patient position: sitting ?Prep: DuraPrep and site prepped and draped ?Patient monitoring: continuous pulse ox, blood pressure and heart rate ?Approach: midline ?Location: L3-L4 ?Injection technique: LOR air ? ?Needle:  ?Needle type: Tuohy  ?Needle gauge: 17 G ?Needle length: 9 cm ?Catheter type: closed end flexible ?Catheter size: 19 Gauge ?Catheter at skin depth: 10 cm ?Test dose: negative and Other (1% lidocaine) ? ?Assessment ?Events: blood not aspirated, injection not painful, no injection resistance, no paresthesia and negative IV test ? ?Additional Notes ?Patient identified. Risks, benefits, and alternatives discussed with patient including but not limited to bleeding, infection, nerve damage, paralysis, failed block, incomplete pain control, headache, blood pressure changes, nausea, vomiting, reactions to medication, itching, and postpartum back pain. Confirmed with bedside nurse the patient's most recent platelet count. Confirmed with patient that they are not currently taking any anticoagulation, have any bleeding history, or any family history of bleeding disorders. Patient expressed understanding and wished to proceed. All questions were answered. Sterile technique was used throughout the entire procedure. Please see nursing notes for vital signs.  ? ?Crisp LOR on first pass. Test dose was given through epidural catheter and negative prior to continuing to dose epidural or start infusion. Warning signs of high block given to the patient including shortness of breath, tingling/numbness in hands, complete motor block, or any concerning  symptoms with instructions to call for help. Patient was given instructions on fall risk and not to get out of bed. All questions and concerns addressed with instructions to call with any issues or inadequate analgesia.  Reason for block:procedure for pain ? ? ? ?

## 2021-07-19 ENCOUNTER — Encounter (HOSPITAL_COMMUNITY): Payer: Self-pay | Admitting: Obstetrics and Gynecology

## 2021-07-19 ENCOUNTER — Encounter (HOSPITAL_COMMUNITY)
Admission: AD | Disposition: A | Discharge status: Home / Self Care | Payer: Self-pay | Source: Home / Self Care | Attending: Obstetrics and Gynecology

## 2021-07-19 DIAGNOSIS — Z98891 History of uterine scar from previous surgery: Secondary | ICD-10-CM

## 2021-07-19 SURGERY — Surgical Case
Anesthesia: Epidural

## 2021-07-19 MED ORDER — ACETAMINOPHEN 500 MG PO TABS
1000.0000 mg | ORAL_TABLET | Freq: Four times a day (QID) | ORAL | Status: DC
  Administered 2021-07-19 – 2021-07-21 (×8): 1000 mg via ORAL
  Filled 2021-07-19 (×8): qty 2

## 2021-07-19 MED ORDER — ZOLPIDEM TARTRATE 5 MG PO TABS: 5.0000 mg | ORAL_TABLET | Freq: Every evening | ORAL | Status: DC | PRN

## 2021-07-19 MED ORDER — DEXAMETHASONE SODIUM PHOSPHATE 10 MG/ML IJ SOLN
INTRAMUSCULAR | Status: AC
  Filled 2021-07-19: qty 1

## 2021-07-19 MED ORDER — CLINDAMYCIN PHOSPHATE 900 MG/50ML IV SOLN
900.0000 mg | Freq: Once | INTRAVENOUS | Status: AC
  Administered 2021-07-19: 900 mg via INTRAVENOUS

## 2021-07-19 MED ORDER — SOD CITRATE-CITRIC ACID 500-334 MG/5ML PO SOLN: 30.0000 mL | Freq: Once | ORAL | Status: DC

## 2021-07-19 MED ORDER — SIMETHICONE 80 MG PO CHEW: 80.0000 mg | CHEWABLE_TABLET | ORAL | Status: DC | PRN

## 2021-07-19 MED ORDER — KETOROLAC TROMETHAMINE 30 MG/ML IJ SOLN
30.0000 mg | Freq: Once | INTRAMUSCULAR | Status: AC
  Administered 2021-07-19: 30 mg via INTRAVENOUS

## 2021-07-19 MED ORDER — OXYTOCIN-SODIUM CHLORIDE 30-0.9 UT/500ML-% IV SOLN
2.5000 [IU]/h | INTRAVENOUS | Status: AC
  Administered 2021-07-19: 2.5 [IU]/h via INTRAVENOUS
  Filled 2021-07-19: qty 500

## 2021-07-19 MED ORDER — MORPHINE SULFATE (PF) 0.5 MG/ML IJ SOLN
INTRAMUSCULAR | Status: DC | PRN
  Administered 2021-07-19: 3 mg via EPIDURAL

## 2021-07-19 MED ORDER — SCOPOLAMINE 1 MG/3DAYS TD PT72
MEDICATED_PATCH | TRANSDERMAL | Status: DC | PRN
  Administered 2021-07-19: 1 via TRANSDERMAL

## 2021-07-19 MED ORDER — SERTRALINE HCL 50 MG PO TABS
50.0000 mg | ORAL_TABLET | Freq: Every day | ORAL | Status: DC
  Administered 2021-07-19 – 2021-07-21 (×3): 50 mg via ORAL
  Filled 2021-07-19 (×3): qty 1

## 2021-07-19 MED ORDER — FENTANYL CITRATE (PF) 100 MCG/2ML IJ SOLN
INTRAMUSCULAR | Status: DC | PRN
  Administered 2021-07-19: 100 ug via INTRAVENOUS

## 2021-07-19 MED ORDER — OXYTOCIN-SODIUM CHLORIDE 30-0.9 UT/500ML-% IV SOLN
INTRAVENOUS | Status: AC
  Filled 2021-07-19: qty 500

## 2021-07-19 MED ORDER — STERILE WATER FOR IRRIGATION IR SOLN
Status: DC | PRN
  Administered 2021-07-19: 1

## 2021-07-19 MED ORDER — ACETAMINOPHEN 10 MG/ML IV SOLN
INTRAVENOUS | Status: DC | PRN
  Administered 2021-07-19: 1000 mg via INTRAVENOUS

## 2021-07-19 MED ORDER — IBUPROFEN 600 MG PO TABS
600.0000 mg | ORAL_TABLET | Freq: Four times a day (QID) | ORAL | Status: DC
  Administered 2021-07-19 – 2021-07-21 (×8): 600 mg via ORAL
  Filled 2021-07-19 (×8): qty 1

## 2021-07-19 MED ORDER — TETANUS-DIPHTH-ACELL PERTUSSIS 5-2.5-18.5 LF-MCG/0.5 IM SUSY: 0.5000 mL | PREFILLED_SYRINGE | Freq: Once | INTRAMUSCULAR | Status: DC

## 2021-07-19 MED ORDER — KETOROLAC TROMETHAMINE 30 MG/ML IJ SOLN
INTRAMUSCULAR | Status: AC
  Filled 2021-07-19: qty 1

## 2021-07-19 MED ORDER — FENTANYL CITRATE (PF) 100 MCG/2ML IJ SOLN
INTRAMUSCULAR | Status: AC
  Filled 2021-07-19: qty 2

## 2021-07-19 MED ORDER — MENTHOL 3 MG MT LOZG: 1.0000 | LOZENGE | OROMUCOSAL | Status: DC | PRN

## 2021-07-19 MED ORDER — OXYTOCIN-SODIUM CHLORIDE 30-0.9 UT/500ML-% IV SOLN: INTRAVENOUS | Status: DC | PRN

## 2021-07-19 MED ORDER — COCONUT OIL OIL: 1.0000 "application " | TOPICAL_OIL | Status: DC | PRN

## 2021-07-19 MED ORDER — FENTANYL CITRATE (PF) 100 MCG/2ML IJ SOLN
INTRAMUSCULAR | Status: DC | PRN
  Administered 2021-07-19: 100 ug via EPIDURAL

## 2021-07-19 MED ORDER — DIBUCAINE (PERIANAL) 1 % EX OINT: 1.0000 "application " | TOPICAL_OINTMENT | CUTANEOUS | Status: DC | PRN

## 2021-07-19 MED ORDER — FENTANYL CITRATE (PF) 100 MCG/2ML IJ SOLN
INTRAMUSCULAR | Status: AC
Start: 2021-07-19 — End: ?
  Filled 2021-07-19: qty 2

## 2021-07-19 MED ORDER — ONDANSETRON HCL 4 MG/2ML IJ SOLN
INTRAMUSCULAR | Status: DC | PRN
  Administered 2021-07-19: 4 mg via INTRAVENOUS

## 2021-07-19 MED ORDER — WITCH HAZEL-GLYCERIN EX PADS: 1.0000 "application " | MEDICATED_PAD | CUTANEOUS | Status: DC | PRN

## 2021-07-19 MED ORDER — LACTATED RINGERS IV SOLN: INTRAVENOUS | Status: DC

## 2021-07-19 MED ORDER — LIDOCAINE-EPINEPHRINE (PF) 2 %-1:200000 IJ SOLN
INTRAMUSCULAR | Status: AC
  Filled 2021-07-19: qty 20

## 2021-07-19 MED ORDER — SCOPOLAMINE 1 MG/3DAYS TD PT72
MEDICATED_PATCH | TRANSDERMAL | Status: AC
  Filled 2021-07-19: qty 1

## 2021-07-19 MED ORDER — SIMETHICONE 80 MG PO CHEW
80.0000 mg | CHEWABLE_TABLET | Freq: Three times a day (TID) | ORAL | Status: DC
  Administered 2021-07-19 – 2021-07-21 (×6): 80 mg via ORAL
  Filled 2021-07-19 (×6): qty 1

## 2021-07-19 MED ORDER — MOMETASONE FURO-FORMOTEROL FUM 200-5 MCG/ACT IN AERO
2.0000 | INHALATION_SPRAY | Freq: Every day | RESPIRATORY_TRACT | Status: DC
  Administered 2021-07-19: 2 via RESPIRATORY_TRACT
  Filled 2021-07-19: qty 8.8

## 2021-07-19 MED ORDER — SODIUM CHLORIDE 0.9 % IV SOLN: INTRAVENOUS | Status: DC | PRN

## 2021-07-19 MED ORDER — PRENATAL MULTIVITAMIN CH
1.0000 | ORAL_TABLET | Freq: Every day | ORAL | Status: DC
  Administered 2021-07-20 – 2021-07-21 (×2): 1 via ORAL
  Filled 2021-07-19 (×2): qty 1

## 2021-07-19 MED ORDER — MORPHINE SULFATE (PF) 0.5 MG/ML IJ SOLN
INTRAMUSCULAR | Status: AC
Start: 2021-07-19 — End: ?
  Filled 2021-07-19: qty 10

## 2021-07-19 MED ORDER — OXYTOCIN-SODIUM CHLORIDE 30-0.9 UT/500ML-% IV SOLN
INTRAVENOUS | Status: DC | PRN
Start: 2021-07-19 — End: 2021-07-19
  Administered 2021-07-19: 400 mL via INTRAVENOUS

## 2021-07-19 MED ORDER — DIPHENHYDRAMINE HCL 25 MG PO CAPS: 25.0000 mg | ORAL_CAPSULE | Freq: Four times a day (QID) | ORAL | Status: DC | PRN

## 2021-07-19 MED ORDER — LIDOCAINE-EPINEPHRINE (PF) 2 %-1:200000 IJ SOLN
INTRAMUSCULAR | Status: DC | PRN
  Administered 2021-07-19: 5 mL via EPIDURAL
  Administered 2021-07-19: 10 mL via EPIDURAL
  Administered 2021-07-19: 5 mL via EPIDURAL

## 2021-07-19 MED ORDER — OXYCODONE HCL 5 MG PO TABS
5.0000 mg | ORAL_TABLET | ORAL | Status: DC | PRN
  Administered 2021-07-20 (×2): 5 mg via ORAL
  Filled 2021-07-19 (×2): qty 1

## 2021-07-19 MED ORDER — CLINDAMYCIN PHOSPHATE 900 MG/50ML IV SOLN
INTRAVENOUS | Status: AC
  Filled 2021-07-19: qty 50

## 2021-07-19 MED ORDER — ACETAMINOPHEN 10 MG/ML IV SOLN
INTRAVENOUS | Status: AC
  Filled 2021-07-19: qty 200

## 2021-07-19 MED ORDER — ONDANSETRON HCL 4 MG/2ML IJ SOLN
INTRAMUSCULAR | Status: AC
  Filled 2021-07-19: qty 2

## 2021-07-19 MED ORDER — DEXAMETHASONE SODIUM PHOSPHATE 10 MG/ML IJ SOLN
INTRAMUSCULAR | Status: DC | PRN
  Administered 2021-07-19: 10 mg via INTRAVENOUS

## 2021-07-19 MED ORDER — SENNOSIDES-DOCUSATE SODIUM 8.6-50 MG PO TABS
2.0000 | ORAL_TABLET | Freq: Every day | ORAL | Status: DC
  Administered 2021-07-20 – 2021-07-21 (×2): 2 via ORAL
  Filled 2021-07-19 (×2): qty 2

## 2021-07-19 SURGICAL SUPPLY — 37 items
BENZOIN TINCTURE PRP APPL 2/3 (GAUZE/BANDAGES/DRESSINGS) ×1 IMPLANT
CHLORAPREP W/TINT 26ML (MISCELLANEOUS) ×2 IMPLANT
CLAMP CORD UMBIL (MISCELLANEOUS) IMPLANT
CLOSURE STERI STRIP 1/2 X4 (GAUZE/BANDAGES/DRESSINGS) ×1 IMPLANT
CLOTH BEACON ORANGE TIMEOUT ST (SAFETY) ×2 IMPLANT
DRAPE C SECTION CLR SCREEN (DRAPES) ×2 IMPLANT
DRSG OPSITE POSTOP 4X10 (GAUZE/BANDAGES/DRESSINGS) ×2 IMPLANT
ELECT REM PT RETURN 9FT ADLT (ELECTROSURGICAL) ×2
ELECTRODE REM PT RTRN 9FT ADLT (ELECTROSURGICAL) ×1 IMPLANT
EXTRACTOR VACUUM KIWI (MISCELLANEOUS) IMPLANT
GLOVE BIO SURGEON STRL SZ 6.5 (GLOVE) ×2 IMPLANT
GLOVE BIOGEL PI IND STRL 7.0 (GLOVE) ×2 IMPLANT
GLOVE BIOGEL PI INDICATOR 7.0 (GLOVE) ×2
GOWN STRL REUS W/TWL LRG LVL3 (GOWN DISPOSABLE) ×4 IMPLANT
KIT ABG SYR 3ML LUER SLIP (SYRINGE) IMPLANT
NDL HYPO 25X5/8 SAFETYGLIDE (NEEDLE) IMPLANT
NEEDLE HYPO 25X5/8 SAFETYGLIDE (NEEDLE) IMPLANT
NS IRRIG 1000ML POUR BTL (IV SOLUTION) ×2 IMPLANT
PACK C SECTION WH (CUSTOM PROCEDURE TRAY) ×2 IMPLANT
PAD OB MATERNITY 4.3X12.25 (PERSONAL CARE ITEMS) ×2 IMPLANT
RETRACTOR WND ALEXIS 25 LRG (MISCELLANEOUS) ×1 IMPLANT
RTRCTR C-SECT PINK 25CM LRG (MISCELLANEOUS) IMPLANT
RTRCTR WOUND ALEXIS 25CM LRG (MISCELLANEOUS) ×2
STRIP CLOSURE SKIN 1/2X4 (GAUZE/BANDAGES/DRESSINGS) IMPLANT
SUT CHROMIC 1 CTX 36 (SUTURE) ×4 IMPLANT
SUT PLAIN 0 NONE (SUTURE) IMPLANT
SUT PLAIN 2 0 XLH (SUTURE) ×2 IMPLANT
SUT VIC AB 0 CT1 27 (SUTURE) ×2
SUT VIC AB 0 CT1 27XBRD ANBCTR (SUTURE) ×2 IMPLANT
SUT VIC AB 2-0 CT1 27 (SUTURE) ×1
SUT VIC AB 2-0 CT1 TAPERPNT 27 (SUTURE) ×1 IMPLANT
SUT VIC AB 3-0 CT1 27 (SUTURE)
SUT VIC AB 3-0 CT1 TAPERPNT 27 (SUTURE) IMPLANT
SUT VIC AB 4-0 KS 27 (SUTURE) ×2 IMPLANT
TOWEL OR 17X24 6PK STRL BLUE (TOWEL DISPOSABLE) ×2 IMPLANT
TRAY FOLEY W/BAG SLVR 14FR LF (SET/KITS/TRAYS/PACK) ×2 IMPLANT
WATER STERILE IRR 1000ML POUR (IV SOLUTION) ×2 IMPLANT

## 2021-07-19 NOTE — Progress Notes (Signed)
Pt sleeping when entering room. Pt vital signs and assessment completed. Pt advised it was time to stand at bedside or ambulate. Pt declines standing at bedside or ambulating in room at this time. Pt request for nurse to return in one hour to stand or ambulate. Will continue to monitor pt. ?

## 2021-07-19 NOTE — Op Note (Signed)
Operative Note ? ? ? ?Preoperative Diagnosis ?IUP at 39 4/7wks ?Arrest of descent ?Maternal fatigue ? ? ?Postoperative Diagnosis ?IUP at 39 4/7wks  ?Arrest of descent ?Maternal fatigue ?Large for gestational age (14lbs 7oz) ? ? ?Procedure: Primary low transverse cesarean section with double layered closure ? ? ?Surgeon: Mindi Slicker, C DO  ? ?Anesthesia: Epidural  ? ?Fluids: LR 1L ?EBL: ?UOP: ? ? ?Findings: Viable female infant in asynclitic presentation, large for gestational age. Apgars 8.9.    ?                 Weight 10lbs 7oz. Grossly normal uterus, tubes and ovaries.  ? ? ?Specimen: Placenta to L/D ? ? ?Procedure Note  ?Patient was taken to the operating room where epidural anesthesia was found to be adequate. She was placed in the dorsal supine position with a leftward tilt. A foley was placed  in a sterile manner to drain the bladder.  Pt was prepped and draped in the usual sterile fashion. An appropriate time out was performed. Allis clamp test confirmed adequate anesthesia.  ?A Pfannenstiel skin incision was then made with the scalpel and carried through to the underlying layer of fascia by sharp dissection and Bovie cautery. The fascia was nicked in the midline and the incision was extended laterally with Mayo scissors. The superior, then inferior, aspects of the incision were grasped with kocher clamps and dissected off the underlying rectus muscles. Rectus muscles were separated in the midline and the peritoneal cavity entered bluntly. The peritoneal incision was then extended both superiorly and inferiorly with careful attention to avoid both bowel and bladder. The Alexis self-retaining wound retractor was then placed within the incision and the lower uterine segment exposed. The bladder flap was developed with Metzenbaum scissors and pushed away from the lower uterine segment.  ?The lower uterine segment was then incised in a transverse fashion and the cavity itself entered bluntly. The incision  was extended bluntly. The vertex was then gently rotated and returned further into the pelvic cavity,  lifted and delivered from the incision without difficulty.  The remainder of the infant delivered easily. Bulb suction of mouth and nose was performed.  After a minute delay, the cord was clamped and cut. The infant was handed off to the waiting NICU team. The placenta was then spontaneously expressed from the uterus and the uterus cleared of all clots and debris with moist lap sponge.  ?The uterine incision was inspected and repaired in 2 layers:  the first layer was a running locked layer 0 chromic and the second an imbricating layer of the same suture. The tubes and ovaries were inspected and the gutters cleared of all clots and debris. The uterine incision was inspected again and found to be hemostatic. All instruments and sponges as well as the Alexis retractor were then removed from the abdomen. The  peritoneum and rectus muscles were then sutured in a running fashion using 2-0 vicryl. The fascia was then closed with 0 Vicryl in a running fashion. Subcutaneous tissue was reapproximated with 3-0 plain in a running fashion. The skin was closed with a subcuticular stitch of 4-0 Vicryl on a Keith needle and then reinforced with benzoin and Steri-Strips.  ?At the conclusion of the procedure all instruments and sponge counts were correct. Patient was taken to the recovery room in good condition with her baby accompanying her skin to skin.   ?

## 2021-07-19 NOTE — Progress Notes (Signed)
Patient ID: Valerie Graham, female   DOB: [birth year], 31 y.o.   MRN: [id number] ?Pt reported increased vaginal pressure with contractions. Denies fever. Chills or HAs.  ?VSS 116/79, 99, 98.2 ?GEN -NAD ?EFM - Cat 1, 140 ?TOCO - ctxs q 2-81mins ?SVE - lip significantly reduced; thin sliver at 9-11 oclock ?Pit - : mvus 130 ? ?A/P:   Prime at 6 4/7wks now completely dilated ?          Attempted pushing with no descent noted  ?          Will labor down for an hour then try pushing again.  ?          Plan for svd  ?           ?

## 2021-07-19 NOTE — Lactation Note (Signed)
This note was copied from a baby's chart. ?Lactation Consultation Note ? ?Patient Name: Valerie Graham ?Today's Date: 07/19/2021 ?Reason for consult: Initial assessment;Term ?Age:31 hours ? ? ?LC Note: ? ?Spoke with RN prior to visiting family; she reported that mother is resting now.  Rn to follow up regarding mother's preference for a lactation consult. ? ? ?Maternal Data ?  ? ?Feeding ?Mother's Current Feeding Choice: Breast Milk ?Nipple Type: Slow - flow ? ?LATCH Score ?  ? ?  ? ?  ? ?  ? ?  ? ?  ? ? ?Lactation Tools Discussed/Used ?  ? ?Interventions ?  ? ?Discharge ?  ? ?Consult Status ?Consult Status: Follow-up from L&D ? ? ? ?Valerie Graham ?07/19/2021, 12:28 PM ? ? ? ?

## 2021-07-19 NOTE — Anesthesia Postprocedure Evaluation (Signed)
Anesthesia Post Note ? ?Patient: Valerie Graham ? ?Procedure(s) Performed: CESAREAN SECTION ? ?  ? ?Patient location during evaluation: PACU ?Anesthesia Type: Epidural ?Level of consciousness: oriented and awake and alert ?Pain management: pain level controlled ?Vital Signs Assessment: post-procedure vital signs reviewed and stable ?Respiratory status: spontaneous breathing, respiratory function stable and nonlabored ventilation ?Cardiovascular status: blood pressure returned to baseline and stable ?Postop Assessment: no headache, no backache, no apparent nausea or vomiting and epidural receding ?Anesthetic complications: no ? ? ?No notable events documented. ? ?Last Vitals:  ?Vitals:  ? 07/19/21 0800 07/19/21 0815  ?BP: 115/80 120/81  ?Pulse: 72 98  ?Resp: (!) 23 19  ?Temp: 36.8 ?C   ?SpO2: 96% 98%  ?  ?Last Pain:  ?Vitals:  ? 07/19/21 0800  ?TempSrc: Oral  ?PainSc:   ? ?Pain Goal:   ? ?  ?  ?  ?  ?  ?  ?Epidural/Spinal Function Cutaneous sensation: Normal sensation (07/19/21 0815), Patient able to flex knees: Yes (07/19/21 0815), Patient able to lift hips off bed: No (07/19/21 0815), Back pain beyond tenderness at insertion site: No (07/19/21 0815), Progressively worsening motor and/or sensory loss: No (07/19/21 0815), Bowel and/or bladder incontinence post epidural: No (07/19/21 0815) ? ?Lucretia Kern ? ? ? ? ?

## 2021-07-19 NOTE — Progress Notes (Signed)
MOB was referred for history of depression/anxiety. ?* Referral screened out by Clinical Social Worker because none of the following criteria appear to apply: ?~ History of anxiety/depression during this pregnancy, or of post-partum depression following prior delivery. ?~ Diagnosis of anxiety and/or depression within last 3 years ?OR ?* MOB's symptoms currently being treated with medication and/or therapy. Per [facility] records MOB symptoms are currently being managed with Zoloft.  No other concerns noted in OB records.  ? ?Please contact the Clinical Social Worker if needs arise, by MOB request, or if MOB scores greater than 9/yes to question 10 on Edinburgh Postpartum Depression Screen.  ? ?Valerie Graham, MSW, LCSW ?Clinical Social Work ?[phone] ?

## 2021-07-19 NOTE — Progress Notes (Signed)
Patient ID: Valerie Graham, female   DOB: May 07, 1990, 31 y.o.   MRN: [id number] ?Pt labored down for an hour then pushed for about to +1 station. Pt reported significant maternal fatigue ?After a discussion about option of proceeding with trial of labor vs primary cesarean section, pt requested the latter.  ?VSS ?GEN- fatigued, falling asleep between contractions, poor effort during   ?           pushing ? EFM - cat 1, 140s ?TOCO - ctxs q 2-57mins MVUs 180-200 ? ? ?A/P: Prime at 73 4/7wks with maternal fatigue complicating attempt at svd - opting for primary cesarean section  ?- Risks/benefits discussed with pt and family as well as expectations in OR consent obtained.  ?- Pitocin stopped ?- Staff and Anesthesia team notified ?- CLinda for prophylaxis ?- ERAS protocol  ?

## 2021-07-19 NOTE — Lactation Note (Signed)
This note was copied from a baby's chart. ?Lactation Consultation Note ? ?Patient Name: Boy Quiera Diffee ?Today's Date: 07/19/2021 ?Reason for consult: Initial assessment;Term ?Age:31 hours ? ? ?LC Note:  ? ?Per RN, mother does not want an initial lactation consult until tomorrow, 07/20/21. ? ? ?Maternal Data ?  ? ?Feeding ?Mother's Current Feeding Choice: Breast Milk ?Nipple Type: Slow - flow ? ?LATCH Score ?  ? ?  ? ?  ? ?  ? ?  ? ?  ? ? ?Lactation Tools Discussed/Used ?  ? ?Interventions ?  ? ?Discharge ?  ? ?Consult Status ?Consult Status: Follow-up from L&D ? ? ? ?Kentarius Partington R Terrence Pizana ?07/19/2021, 12:29 PM ? ? ? ?

## 2021-07-19 NOTE — Transfer of Care (Signed)
Immediate Anesthesia Transfer of Care Note ? ?Patient: Valerie Graham ? ?Procedure(s) Performed: CESAREAN SECTION ? ?Patient Location: PACU ? ?Anesthesia Type:Epidural ? ?Level of Consciousness: awake, alert  and oriented ? ?Airway & Oxygen Therapy: Patient Spontanous Breathing ? ?Post-op Assessment: Report given to RN and Post -op Vital signs reviewed and stable ? ?Post vital signs: Reviewed and stable HR 100, RR 16, SaO2 99% ? ?Last Vitals:  ?Vitals Value Taken Time  ?BP 125/107 07/19/21 0711  ?Temp    ?Pulse    ?Resp    ?SpO2    ?Vitals shown include unvalidated device data. ? ?Last Pain:  ?Vitals:  ? 07/19/21 0401  ?TempSrc:   ?PainSc: 8   ?   ? ?  ? ?Complications: No notable events documented. ?

## 2021-07-20 LAB — CBC
HCT: 23.8 % — ABNORMAL LOW (ref 36.0–46.0)
Hemoglobin: 7.5 g/dL — ABNORMAL LOW (ref 12.0–15.0)
MCH: 26.6 pg (ref 26.0–34.0)
MCHC: 31.5 g/dL (ref 30.0–36.0)
MCV: 84.4 fL (ref 80.0–100.0)
Platelets: 161 10*3/uL (ref 150–400)
RBC: 2.82 MIL/uL — ABNORMAL LOW (ref 3.87–5.11)
RDW: 20.1 % — ABNORMAL HIGH (ref 11.5–15.5)
WBC: 15.7 10*3/uL — ABNORMAL HIGH (ref 4.0–10.5)
nRBC: 0 % (ref 0.0–0.2)

## 2021-07-20 LAB — BIRTH TISSUE RECOVERY COLLECTION (PLACENTA DONATION)

## 2021-07-20 MED ORDER — POLYSACCHARIDE IRON COMPLEX 150 MG PO CAPS
150.0000 mg | ORAL_CAPSULE | Freq: Every day | ORAL | Status: DC
  Administered 2021-07-20 – 2021-07-21 (×2): 150 mg via ORAL
  Filled 2021-07-20 (×2): qty 1

## 2021-07-20 MED ORDER — LACTATED RINGERS IV BOLUS
500.0000 mL | Freq: Once | INTRAVENOUS | Status: AC
  Administered 2021-07-20: 500 mL via INTRAVENOUS

## 2021-07-20 NOTE — Progress Notes (Signed)
Patient states she feels improvement of symptoms after 500 ml LR blous, does not want RN to request order for IV Iron or Blood at this time.   ?

## 2021-07-20 NOTE — Progress Notes (Signed)
Pt complains of blurred vision, shakiness, weakness and extreme fatigue. FOB stated that when they walked together around the hallway pt was barely able to make it back to room. Nurse called Dr Willis Modena to report on symptoms. Hbag at 7.5. Pt currently on oral iron. Dr Willis Modena stated that he will speak with pt tonight and decide best options for her moving forward. Nurse made pt aware of conversation with OB, pt verbalized understanding.  ?

## 2021-07-20 NOTE — Progress Notes (Signed)
Subjective: ?Postpartum Day #1: Cesarean Delivery ?Patient reports incisional pain and tolerating PO.   ? ?Objective: ?Vital signs in last 24 hours: ?Temp:  [98.2 ?F (36.8 ?C)-98.8 ?F (37.1 ?C)] 98.4 ?F (36.9 ?C) (03/22 0450) ?Pulse Rate:  [68-83] 78 (03/22 0450) ?Resp:  [16-18] 18 (03/22 0450) ?BP: (105-123)/(74-92) 114/77 (03/22 0450) ?SpO2:  [95 %-99 %] 98 % (03/22 0450) ? ?Physical Exam:  ?General: alert ?Lochia: appropriate ?Uterine Fundus: firm ?Incision: dressing C/D/I ? ?Recent Labs  ?  07/17/21 ?2350 07/20/21 ?0445  ?HGB 9.8* 7.5*  ?HCT 31.6* 23.8*  ? ? ?Assessment/Plan: ?Status post Cesarean section. Doing well postoperatively.  ?Continue current care, ambulate, will start niferex for pre-existing anemia. ? ?Valerie Graham ?07/20/2021, 8:30 AM ? ? ?

## 2021-07-20 NOTE — Progress Notes (Signed)
See previous note from RN, symptomatic anemia ?Discussed options, discussed quickest resolution of symptoms would come from a unit of PRBC, discussed small risk of HIV and HCV.  She would prefer IV bolus first, might consent to 1 unit PRBC if no improvement ?

## 2021-07-20 NOTE — Lactation Note (Addendum)
This note was copied from a baby's chart. ?Lactation Consultation Note ? ?Patient Name: Valerie Graham ?Today's Date: 07/20/2021 ?  ?Age:31 hours ? ?LC went in to see Mom. Infant had a recent feeding of 37 ml of formula. Mom would like to rest at this time. Mom to call for latch assistance with next feeding. Infant mainly fed with bottles of formula.  ? ? ?Maternal Data ?  ? ?Feeding ?Nipple Type: Slow - flow ? ?LATCH Score ?  ? ?  ? ?  ? ?  ? ?  ? ?  ? ? ?Lactation Tools Discussed/Used ?  ? ?Interventions ?  ? ?Discharge ?  ? ?Consult Status ?  ? ? ? ?Moet Mikulski  [place] ?07/20/2021, 1:43 PM ? ? ? ?

## 2021-07-20 NOTE — Lactation Note (Signed)
This note was copied from a baby's chart. ?Lactation Consultation Note ? ?Patient Name: Valerie Graham ?Today's Date: 07/20/2021 ?Reason for consult: Initial assessment;Term;Infant weight loss;Exclusive pumping and bottle feeding;1st time breastfeeding (Infant with -2% weight loss, LGA greater than 9 lbs at birth.) ?Age:31 hours ?Per mom, her feeding choice is to " pump only" and formula feed infant. 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 ?LC explained how to use DEBP, mom was pumping when LC left the room, mom was expressing a few drops of colostrum in breast flange that she will finger feed infant at the next feeding. ?Mom shown how to use DEBP & how to disassemble, clean, & reassemble parts.  ?Mom knows to start pumping every 3 hours for 15 minutes on initial setting. ?Mom knows to call [place] if she has any BF questions or concerns. ?Mom will offer any pumped EBM first before formula to infant. ?Mom made aware of O/P services, breastfeeding support groups, community resources, and our phone # for post-discharge questions.   ?Maternal Data ?Has patient been taught Hand Expression?: No ?Does the patient have breastfeeding experience prior to this delivery?: No ? ?Feeding ?Mother's Current Feeding Choice: Breast Milk and Formula ?Nipple Type: Slow - flow ? ?LATCH Score ?  ? ?  ? ?  ? ?  ? ?  ? ?  ? ? ?Lactation Tools Discussed/Used ?Tools: Pump ?Breast pump type: Double-Electric Breast Pump ?Pump Education: Setup, frequency, and cleaning;Milk Storage ?Reason for Pumping: Mom plan is to "pump only" and formula fed infant. ?Pumping frequency: Mom will pump every 3 hours for 15 minutes on inital setting. ? ?Interventions ?Interventions: Breast feeding basics reviewed;DEBP;Pace feeding;LC Services brochure ? ?Discharge ?  ? ?Consult Status ?Consult Status: Follow-up ?Date: 07/21/21 ?Follow-up type: In-patient ? ? ? ?Vicente Serene ?07/20/2021, 11:45  PM ? ? ? ?

## 2021-07-20 NOTE — Lactation Note (Signed)
This note was copied from a baby's chart. ?Lactation Consultation Note ? ?Patient Name: Valerie Graham ?Today's Date: 07/20/2021 ?  ?Age:31 hours ?Per RN, mom wants be seen by [facility] services in the morning, declined LC services tonight. ?Maternal Data ?  ? ?Feeding ?Nipple Type: Slow - flow ? ?LATCH Score ?  ? ?  ? ?  ? ?  ? ?  ? ?  ? ? ?Lactation Tools Discussed/Used ?  ? ?Interventions ?  ? ?Discharge ?  ? ?Consult Status ?  ? ? ? ?Danelle Earthly ?07/20/2021, 1:17 AM ? ? ? ?

## 2021-07-21 ENCOUNTER — Other Ambulatory Visit (HOSPITAL_COMMUNITY): Payer: Self-pay

## 2021-07-21 MED ORDER — OXYCODONE HCL 5 MG PO TABS
5.0000 mg | ORAL_TABLET | ORAL | 0 refills | Status: DC | PRN
  Filled 2021-07-21: qty 20, 4d supply, fill #0

## 2021-07-21 MED ORDER — IBUPROFEN 600 MG PO TABS
600.0000 mg | ORAL_TABLET | Freq: Four times a day (QID) | ORAL | 0 refills | Status: DC
  Filled 2021-07-21: qty 30, 8d supply, fill #0

## 2021-07-21 NOTE — Lactation Note (Signed)
This note was copied from a baby's chart. ?Lactation Consultation Note ? ?Patient Name: Valerie Graham ?Today's Date: 07/21/2021 ?Reason for consult: Other (Comment) (Giving UMR pump) ?Age:31 hours ? ?Mom was given her UMR pump. Mom was encouraged to pump whenever infant receives formula. Mom said she also knows how to use the hand pump that was provided in the DEBP kit.  ? ?Mom had no questions for me. Dad's questions were answered.  ? ?Interventions ?Interventions: Education ? ?Discharge ?Pump: Employee Pump ? ?Consult Status ?Consult Status: Complete ? ? ?Matthias Hughs [place] ?07/21/2021, 12:05 PM ? ? ? ?

## 2021-07-21 NOTE — Discharge Summary (Signed)
? ?  Postpartum Discharge Summary ? ?Date of Service updated 07/21/21 ? ?   ?Patient Name: Tionne Carelli ?DOB: 06/06/1990 ?MRN: [id number] ? ?Date of admission: 07/17/2021 ?Delivery date:07/19/2021  ?Delivering provider: [facility]  ?Date of discharge: 07/21/2021 ? ?Admitting diagnosis: Normal labor [O80, Z37.9] ?Intrauterine pregnancy: [redacted]w[redacted]d     ?Secondary diagnosis:  Principal Problem: ?  Normal labor ?Active Problems: ?  S/P cesarean section ? ?Additional problems: IVF pregnancy    ?Discharge diagnosis: Term Pregnancy Delivered                                              ?Post partum procedures: none ?Augmentation: Pitocin ?Complications: None ? ?Hospital course: Onset of Labor With Unplanned C/S   ?31 y.o. yo G1P1001 at [redacted]w[redacted]d was admitted in Latent Labor on 07/17/2021. Patient had a labor course significant for protracted active labor. The patient went for cesarean section due to Arrest of Descent. Delivery details as follows: ?Membrane Rupture Time/Date: 9:30 PM ,07/17/2021   ?Delivery Method:C-Section, Low Transverse  ?Details of operation can be found in separate operative note. Patient had an uncomplicated postpartum course.  She is ambulating,tolerating a regular diet, passing flatus, and urinating well.  On the evening of POD#1 she reported feeling "shaky."  Her hemoglobin dropped from 9.8 preop to 7.8 post-cesarean section.  She was offered blood transfusion but declined and elected for IVF.  On POD#2, she felt well.  All symptoms had resolved. Patient is discharged home in stable condition 07/21/21. ? ?Newborn Data: ?Birth date:07/19/2021  ?Birth time:6:26 AM  ?Gender:Female  ?Living status:Living  ?Apgars:7 ,8  ?Weight:4640 g  ? ? ? ? ?Physical exam  ?Vitals:  ? 07/20/21 0450 07/20/21 1400 07/20/21 2209 07/21/21 0641  ?BP: 114/77 124/86 118/80 127/89  ?Pulse: 78 70 68 70  ?Resp: 18 16 16 18   ?Temp: 98.4 ?F (36.9 ?C) 97.7 ?F (36.5 ?C) 97.9 ?F (36.6 ?C) 98.3 ?F (36.8 ?C)  ?TempSrc: Oral Axillary Oral  Oral  ?SpO2: 98%  99%   ?Weight:      ?Height:      ? ?General: alert, cooperative, and no distress ?Lochia: appropriate ?Uterine Fundus: firm ?Incision: Healing well with no significant drainage ?DVT Evaluation: No evidence of DVT seen on physical exam. ?Labs: ?Lab Results  ?Component Value Date  ? WBC 15.7 (H) 07/20/2021  ? HGB 7.5 (L) 07/20/2021  ? HCT 23.8 (L) 07/20/2021  ? MCV 84.4 07/20/2021  ? PLT 161 07/20/2021  ? ? ?  Latest Ref Rng & Units 07/17/2021  ? 11:50 PM  ?CMP  ?Glucose 70 - 99 mg/dL 81    ?BUN 6 - 20 mg/dL 9    ?Creatinine 0.44 - 1.00 mg/dL 07/19/2021    ?Sodium 135 - 145 mmol/L 137    ?Potassium 3.5 - 5.1 mmol/L 4.0    ?Chloride 98 - 111 mmol/L 105    ?CO2 22 - 32 mmol/L 21    ?Calcium 8.9 - 10.3 mg/dL 9.3    ?Total Protein 6.5 - 8.1 g/dL 5.9    ?Total Bilirubin 0.3 - 1.2 mg/dL 0.5    ?Alkaline Phos 38 - 126 U/L 299    ?AST 15 - 41 U/L 33    ?ALT 0 - 44 U/L 22    ? ?Edinburgh Score: ? ?  07/20/2021  ? 11:20 PM  ?07/22/2021 Postnatal Depression Scale Screening  Tool  ?I have been able to laugh and see the funny side of things. 1  ?I have looked forward with enjoyment to things. 0  ?I have blamed myself unnecessarily when things went wrong. 2  ?I have been anxious or worried for no good reason. 2  ?I have felt scared or panicky for no good reason. 2  ?Things have been getting on top of me. 1  ?I have been so unhappy that I have had difficulty sleeping. 0  ?I have felt sad or miserable. 1  ?I have been so unhappy that I have been crying. 0  ?The thought of harming myself has occurred to me. 0  ?Edinburgh Postnatal Depression Scale Total 9  ? ? ? ? ?After visit meds:  ?Allergies as of 07/21/2021   ? ?   Reactions  ? Penicillins Other (See Comments)  ? Reaction occurred as a child and pt is uncertain what the reaction was  ? ?  ? ?  ?Medication List  ?  ? ?STOP taking these medications   ? ?B-D 3CC LUER-LOK SYR 18GX1-1/2 18G X 1-1/2" 3 ML Misc ?Generic drug: SYRINGE-NEEDLE (DISP) 3 ML ?  ?BD Disp Needles 30G X  1/2" Misc ?Generic drug: NEEDLE (DISP) 30 G ?  ?Easy Touch Hypodermic Needle 22G X 1-1/2" Misc ?Generic drug: NEEDLE (DISP) 22 G ?  ? ?  ? ?TAKE these medications   ? ?Advair Diskus 250-50 MCG/ACT Aepb ?Generic drug: fluticasone-salmeterol ?Inhale 1 puff by mouth into the lungs daily ?What changed:  ?how much to take ?when to take this ?  ?albuterol 108 (90 Base) MCG/ACT inhaler ?Commonly known as: ProAir HFA ?Inhale 2 puffs every 4 hours as directed. ?What changed:  ?how much to take ?when to take this ?reasons to take this ?  ?ferrous sulfate 325 (65 FE) MG tablet ?Take 1 tablet by mouth once a day ?What changed:  ?how much to take ?when to take this ?  ?ibuprofen 600 MG tablet ?Commonly known as: ADVIL ?Take 1 tablet (600 mg total) by mouth every 6 (six) hours. ?  ?oxyCODONE 5 MG immediate release tablet ?Commonly known as: Oxy IR/ROXICODONE ?Take 1 tablet (5 mg total) by mouth every 4 (four) hours as needed for moderate pain. ?  ?sertraline 50 MG tablet ?Commonly known as: ZOLOFT ?TAKE 1 TABLET BY MOUTH ONCE A DAY ?What changed: how much to take ?  ? ?  ? ?  ?  ? ? ?  ?Discharge Care Instructions  ?(From admission, onward)  ?  ? ? ?  ? ?  Start     Ordered  ? 07/21/21 0000  No dressing needed       ? 07/21/21 0908  ? ?  ?  ? ?  ? ? ? ?Discharge home in stable condition ?Infant Feeding: Breast ?Infant Disposition:home with mother ?Discharge instruction: per After Visit Summary and Postpartum booklet. ?Activity: Advance as tolerated. Pelvic rest for 6 weeks.  ?Diet: routine diet ?Anticipated Birth Control: Unsure ?Postpartum Appointment:4 weeks ?Additional Postpartum F/U:  n/a ?Future Appointments:No future appointments. ?Follow up Visit: ? ? ?  ? ?07/21/2021 ?Rovena Hearld GEFFEL Chestine Spore, MD ? ? ?

## 2021-07-21 NOTE — Progress Notes (Signed)
Patient is doing well.  She is tolerating PO, ambulating, voiding.  Pain is controlled.  Lochia is appropriate. ?Felt "shaky" yesterday.  Declined blood transfusion.  Received some extra IVF yesterday evening and feels much better today.  Desires discharge ? ?Vitals:  ? 07/20/21 0450 07/20/21 1400 07/20/21 2209 07/21/21 0641  ?BP: 114/77 124/86 118/80 127/89  ?Pulse: 78 70 68 70  ?Resp: 18 16 16 18   ?Temp: 98.4 ?F (36.9 ?C) 97.7 ?F (36.5 ?C) 97.9 ?F (36.6 ?C) 98.3 ?F (36.8 ?C)  ?TempSrc: Oral Axillary Oral Oral  ?SpO2: 98%  99%   ?Weight:      ?Height:      ? ? ?NAD ?Abdomen:  soft, appropriate tenderness, incisions intact and without erythema or drainage ?ext:    Symmetric, trace edema bilaterally ? ?Lab Results  ?Component Value Date  ? WBC 15.7 (H) 07/20/2021  ? HGB 7.5 (L) 07/20/2021  ? HCT 23.8 (L) 07/20/2021  ? MCV 84.4 07/20/2021  ? PLT 161 07/20/2021  ? ? ?--/--/O POS (03/19 2350) ? ?A/P    31 y.o. G1P1001 POD 1 s/p primary cesarean section ?Routine post op and postpartum care.   ?Continue PO iron, plan repeat cbc at [facility] visit ?Meeting all goals.  Discharge to home today. ? ? ?

## 2021-07-21 NOTE — Social Work (Signed)
CSW received and acknowledges consult for EDPS of 9.  Consult screened out due to 9 on EDPS does not warrant a CSW consult.  MOB whom scores are greater than 9/yes to question 10 on Edinburgh Postpartum Depression Screen warrants a CSW consult.  ? ?Felix Meras, MSW, LCSW ?[facility]  ?Clinical Social Worker  ?[phone] ?07/21/2021  8:22 AM  ?

## 2021-07-22 ENCOUNTER — Other Ambulatory Visit (HOSPITAL_COMMUNITY): Payer: Self-pay

## 2021-07-25 ENCOUNTER — Inpatient Hospital Stay (HOSPITAL_COMMUNITY): Payer: No Typology Code available for payment source

## 2021-07-30 ENCOUNTER — Telehealth (HOSPITAL_COMMUNITY): Payer: Self-pay

## 2021-07-30 NOTE — Telephone Encounter (Signed)
No answer. Left message to return nurse call. ? Jerald Kief ?07/30/2021,1212 ?

## 2021-08-01 ENCOUNTER — Other Ambulatory Visit (HOSPITAL_COMMUNITY): Payer: Self-pay

## 2021-08-02 ENCOUNTER — Other Ambulatory Visit (HOSPITAL_COMMUNITY): Payer: Self-pay

## 2021-08-02 MED ORDER — NITROFURANTOIN MONOHYD MACRO 100 MG PO CAPS
ORAL_CAPSULE | ORAL | 0 refills | Status: DC
  Filled 2021-08-02: qty 14, 7d supply, fill #0

## 2021-08-03 ENCOUNTER — Other Ambulatory Visit (HOSPITAL_COMMUNITY): Payer: Self-pay

## 2021-08-16 ENCOUNTER — Other Ambulatory Visit (HOSPITAL_COMMUNITY): Payer: Self-pay

## 2021-08-16 MED ORDER — SERTRALINE HCL 50 MG PO TABS
ORAL_TABLET | ORAL | 4 refills | Status: DC
  Filled 2021-08-16: qty 90, 60d supply, fill #0

## 2021-10-05 ENCOUNTER — Other Ambulatory Visit: Payer: Self-pay

## 2021-10-07 ENCOUNTER — Other Ambulatory Visit (HOSPITAL_COMMUNITY): Payer: Self-pay

## 2022-01-10 ENCOUNTER — Other Ambulatory Visit (HOSPITAL_COMMUNITY): Payer: Self-pay

## 2022-01-10 ENCOUNTER — Ambulatory Visit (INDEPENDENT_AMBULATORY_CARE_PROVIDER_SITE_OTHER): Payer: No Typology Code available for payment source | Admitting: Nurse Practitioner

## 2022-01-10 ENCOUNTER — Encounter: Payer: Self-pay | Admitting: Nurse Practitioner

## 2022-01-10 VITALS — BP 98/60 | HR 65 | Temp 97.2°F | Ht 67.0 in | Wt 174.6 lb

## 2022-01-10 DIAGNOSIS — F411 Generalized anxiety disorder: Secondary | ICD-10-CM | POA: Diagnosis not present

## 2022-01-10 DIAGNOSIS — Z136 Encounter for screening for cardiovascular disorders: Secondary | ICD-10-CM | POA: Diagnosis not present

## 2022-01-10 DIAGNOSIS — Z0001 Encounter for general adult medical examination with abnormal findings: Secondary | ICD-10-CM

## 2022-01-10 DIAGNOSIS — Z13220 Encounter for screening for lipoid disorders: Secondary | ICD-10-CM | POA: Diagnosis not present

## 2022-01-10 MED ORDER — SERTRALINE HCL 50 MG PO TABS
50.0000 mg | ORAL_TABLET | Freq: Every day | ORAL | 3 refills | Status: DC
  Filled 2022-01-10: qty 90, 90d supply, fill #0
  Filled 2022-05-03: qty 90, 90d supply, fill #1
  Filled 2022-08-14: qty 90, 90d supply, fill #2
  Filled 2022-11-27: qty 90, 90d supply, fill #3

## 2022-01-10 NOTE — Progress Notes (Signed)
Complete physical exam  Patient: Valerie Graham   DOB: November 09, 1990   31 y.o. Female  MRN: [id number] Visit Date: 01/10/2022  Subjective:    Chief Complaint  Patient presents with   Annual Exam    CPE Pt not fasting  No concerns    Valerie Graham is a 31 y.o. female who presents today for a complete physical exam. She reports consuming a general diet.  Walking daily  She generally feels well. She reports sleeping well. She does not have additional problems to discuss today.  Vision:Yes Dental:No STD Screen:No  Most recent fall risk assessment:    04/09/2020    1:20 PM  Fall Risk   Falls in the past year? 1  Number falls in past yr: 1  Injury with Fall? 1   Most recent depression screenings:    01/10/2022    1:39 PM 10/08/2020    1:31 PM  PHQ 2/9 Scores  PHQ - 2 Score 2 0  PHQ- 9 Score 9 6   HPI  GAD (generalized anxiety disorder) Stable mood with zoloft Med refill sent   Past Medical History:  Diagnosis Date   Anxiety    Chicken pox    Depression    Family history of ovarian cancer    Family history of prostate cancer    Family history of skin cancer    Herpes simplex without mention of complication 07/02/11   Past Surgical History:  Procedure Laterality Date   CESAREAN SECTION N/A 07/19/2021   Procedure: CESAREAN SECTION;  Surgeon: Edwinna Areola, DO;  Location: [facility] LD ORS;  Service: Obstetrics;  Laterality: N/A;   TONSILLECTOMY  2014   WISDOM TOOTH EXTRACTION     Social History   Socioeconomic History   Marital status: Married    Spouse name: D Psychologist, prison and probation services   Number of children: Not on file   Years of education: Not on file   Highest education level: Not on file  Occupational History   Occupation: insurance  Tobacco Use   Smoking status: Never   Smokeless tobacco: Never  Vaping Use   Vaping Use: Never used  Substance and Sexual Activity   Alcohol use: Not Currently   Drug use: No   Sexual activity: Yes    Birth control/protection:  None  Other Topics Concern   Not on file  Social History Narrative   Not on file   Social Determinants of Health   Financial Resource Strain: Not on file  Food Insecurity: Not on file  Transportation Needs: Not on file  Physical Activity: Not on file  Stress: Not on file  Social Connections: Not on file  Intimate Partner Violence: Not on file   Family Status  Relation Name Status   PGF  Deceased   PGM  Deceased   MGM  Alive       breast   MGF  Deceased   Father  Deceased   Mother  Alive   Mat Aunt  Alive   Pat Uncle  Alive   Pat Aunt  Deceased   Other Father's grand father Alive   Sister pat half sister Alive   Mat Uncle  Alive   Family History  Problem Relation Age of Onset   Cancer Paternal Grandfather    Cancer Maternal Grandmother 43       breast   Cancer Father 8       bladder/prostate metastatic, neg genetic testing 2019   Endometriosis Mother 19  hysterectomy   Diabetes Maternal Aunt    Cancer Paternal Uncle 31       prostate   Stroke Paternal Uncle    Cancer Paternal Aunt        ovarian   Cancer Other        colon cancer   Allergies  Allergen Reactions   Penicillins Other (See Comments)    Reaction occurred as a child and pt is uncertain what the reaction was    Patient Care Team: Nakiah Osgood, Bonna Gains, NP as PCP - General (Internal Medicine)   Medications: Outpatient Medications Prior to Visit  Medication Sig   albuterol (PROAIR HFA) 108 (90 Base) MCG/ACT inhaler Inhale 2 puffs every 4 hours as directed. (Patient taking differently: Inhale 2 puffs into the lungs every 4 (four) hours as needed for wheezing or shortness of breath.)   fluticasone-salmeterol (ADVAIR) 250-50 MCG/ACT AEPB Inhale 1 puff by mouth into the lungs twice daily   [DISCONTINUED] sertraline (ZOLOFT) 50 MG tablet TAKE 1 TABLET BY MOUTH ONCE A DAY (Patient taking differently: Take 50 mg by mouth daily.)   [DISCONTINUED] ferrous sulfate 325 (65 FE) MG tablet Take 1 tablet by  mouth once a day (Patient not taking: Reported on 01/10/2022)   [DISCONTINUED] ibuprofen (ADVIL) 600 MG tablet Take 1 tablet (600 mg total) by mouth every 6 (six) hours. (Patient not taking: Reported on 01/10/2022)   [DISCONTINUED] nitrofurantoin, macrocrystal-monohydrate, (MACROBID) 100 MG capsule Take 1 capsule twice a day by mouth for 7 days. (Patient not taking: Reported on 01/10/2022)   [DISCONTINUED] oxyCODONE (OXY IR/ROXICODONE) 5 MG immediate release tablet Take 1 tablet (5 mg total) by mouth every 4 (four) hours as needed for moderate pain. (Patient not taking: Reported on 01/10/2022)   [DISCONTINUED] sertraline (ZOLOFT) 50 MG tablet Take 1 and 1/2 tablets (75 mg) by mouth every day. (Patient not taking: Reported on 01/10/2022)   No facility-administered medications prior to visit.    Review of Systems  Constitutional:  Negative for fever.  HENT:  Negative for congestion and sore throat.   Eyes:        Negative for visual changes  Respiratory:  Negative for cough and shortness of breath.   Cardiovascular:  Negative for chest pain, palpitations and leg swelling.  Gastrointestinal:  Negative for blood in stool, constipation and diarrhea.  Genitourinary:  Negative for dysuria, frequency and urgency.  Musculoskeletal:  Negative for myalgias.  Skin:  Negative for rash.  Neurological:  Negative for dizziness and headaches.  Hematological:  Does not bruise/bleed easily.  Psychiatric/Behavioral:  Negative for suicidal ideas. The patient is not nervous/anxious.         Objective:  BP 98/60 (BP Location: Right Arm, Patient Position: Sitting, Cuff Size: Normal)   Pulse 65   Temp (!) 97.2 F (36.2 C) (Temporal)   Ht 5\' 7"  (1.702 m)   Wt 174 lb 9.6 oz (79.2 kg)   LMP 12/30/2021 (Exact Date)   SpO2 (!) 82%   Breastfeeding No   BMI 27.35 kg/m     BP Readings from Last 3 Encounters:  01/10/22 98/60  07/21/21 127/89  06/20/21 115/80   Wt Readings from Last 3 Encounters:  01/10/22  174 lb 9.6 oz (79.2 kg)  07/18/21 206 lb (93.4 kg)  10/08/20 182 lb (82.6 kg)   Physical Exam Vitals reviewed.  Constitutional:      General: She is not in acute distress.    Appearance: She is well-developed.  HENT:  Right Ear: Tympanic membrane, ear canal and external ear normal.     Left Ear: Tympanic membrane, ear canal and external ear normal.     Nose: Nose normal.  Eyes:     Extraocular Movements: Extraocular movements intact.     Conjunctiva/sclera: Conjunctivae normal.  Cardiovascular:     Rate and Rhythm: Normal rate and regular rhythm.     Pulses: Normal pulses.     Heart sounds: Normal heart sounds.  Pulmonary:     Effort: Pulmonary effort is normal. No respiratory distress.     Breath sounds: Normal breath sounds.  Chest:     Chest wall: No tenderness.  Abdominal:     General: Bowel sounds are normal.     Palpations: Abdomen is soft.  Genitourinary:    Comments: Deferred breast and pelvic exam to GYN Musculoskeletal:        General: Normal range of motion.     Cervical back: Normal range of motion and neck supple.     Right lower leg: No edema.     Left lower leg: No edema.  Lymphadenopathy:     Cervical: No cervical adenopathy.  Skin:    General: Skin is warm and dry.  Neurological:     Mental Status: She is alert and oriented to person, place, and time.     Deep Tendon Reflexes: Reflexes are normal and symmetric.  Psychiatric:        Mood and Affect: Mood normal.        Behavior: Behavior normal.        Thought Content: Thought content normal.      No results found for any visits on 01/10/22.    Assessment & Plan:    Routine Health Maintenance and Physical Exam  Immunization History  Administered Date(s) Administered   PFIZER Comirnaty(Gray Top)Covid-19 Tri-Sucrose Vaccine 08/04/2019, 09/08/2019   Tdap 05/06/2021    Health Maintenance  Topic Date Due   COVID-19 Vaccine (3 - Pfizer series) 01/26/2022 (Originally 11/03/2019)   INFLUENZA  VACCINE  07/30/2022 (Originally 11/29/2021)   Hepatitis C Screening  01/11/2023 (Originally 05/25/2008)   PAP SMEAR-Modifier  05/25/2024   TETANUS/TDAP  05/07/2031   HIV Screening  Completed   HPV VACCINES  Aged Out    Discussed health benefits of physical activity, and encouraged her to engage in regular exercise appropriate for her age and condition.  Problem List Items Addressed This Visit       Other   GAD (generalized anxiety disorder)    Stable mood with zoloft Med refill sent      Relevant Medications   sertraline (ZOLOFT) 50 MG tablet   Other Visit Diagnoses     Encounter for preventative adult health care exam with abnormal findings    -  Primary   Relevant Orders   CBC with Differential/Platelet   Comprehensive metabolic panel   TSH   Lipid panel   Encounter for lipid screening for cardiovascular disease       Relevant Orders   Lipid panel      Return in about 1 year (around 01/11/2023) for CPE (fasting).     Alysia Penna, NP

## 2022-01-10 NOTE — Patient Instructions (Signed)
Schedule fasting lab appt.  Preventive Care 65-31 Years Old, Female Preventive care refers to lifestyle choices and visits with your health care provider that can promote health and wellness. Preventive care visits are also called wellness exams. What can I expect for my preventive care visit? Counseling During your preventive care visit, your health care provider may ask about your: Medical history, including: Past medical problems. Family medical history. Pregnancy history. Current health, including: Menstrual cycle. Method of birth control. Emotional well-being. Home life and relationship well-being. Sexual activity and sexual health. Lifestyle, including: Alcohol, nicotine or tobacco, and drug use. Access to firearms. Diet, exercise, and sleep habits. Work and work Statistician. Sunscreen use. Safety issues such as seatbelt and bike helmet use. Physical exam Your health care provider may check your: Height and weight. These may be used to calculate your BMI (body mass index). BMI is a measurement that tells if you are at a healthy weight. Waist circumference. This measures the distance around your waistline. This measurement also tells if you are at a healthy weight and may help predict your risk of certain diseases, such as type 2 diabetes and high blood pressure. Heart rate and blood pressure. Body temperature. Skin for abnormal spots. What immunizations do I need?  Vaccines are usually given at various ages, according to a schedule. Your health care provider will recommend vaccines for you based on your age, medical history, and lifestyle or other factors, such as travel or where you work. What tests do I need? Screening Your health care provider may recommend screening tests for certain conditions. This may include: Pelvic exam and Pap test. Lipid and cholesterol levels. Diabetes screening. This is done by checking your blood sugar (glucose) after you have not eaten for  a while (fasting). Hepatitis B test. Hepatitis C test. HIV (human immunodeficiency virus) test. STI (sexually transmitted infection) testing, if you are at risk. BRCA-related cancer screening. This may be done if you have a family history of breast, ovarian, tubal, or peritoneal cancers. Talk with your health care provider about your test results, treatment options, and if necessary, the need for more tests. Follow these instructions at home: Eating and drinking  Eat a healthy diet that includes fresh fruits and vegetables, whole grains, lean protein, and low-fat dairy products. Take vitamin and mineral supplements as recommended by your health care provider. Do not drink alcohol if: Your health care provider tells you not to drink. You are pregnant, may be pregnant, or are planning to become pregnant. If you drink alcohol: Limit how much you have to 0-1 drink a day. Know how much alcohol is in your drink. In the [place] one drink equals one 12 oz bottle of beer (355 mL), one 5 oz glass of wine (148 mL), or one 1 oz glass of hard liquor (44 mL). Lifestyle Brush your teeth every morning and night with fluoride toothpaste. Floss one time each day. Exercise for at least 30 minutes 5 or more days each week. Do not use any products that contain nicotine or tobacco. These products include cigarettes, chewing tobacco, and vaping devices, such as e-cigarettes. If you need help quitting, ask your health care provider. Do not use drugs. If you are sexually active, practice safe sex. Use a condom or other form of protection to prevent STIs. If you do not wish to become pregnant, use a form of birth control. If you plan to become pregnant, see your health care provider for a prepregnancy visit. Find healthy ways to  manage stress, such as: Meditation, yoga, or listening to music. Journaling. Talking to a trusted person. Spending time with friends and family. Minimize exposure to UV radiation to  reduce your risk of skin cancer. Safety Always wear your seat belt while driving or riding in a vehicle. Do not drive: If you have been drinking alcohol. Do not ride with someone who has been drinking. If you have been using any mind-altering substances or drugs. While texting. When you are tired or distracted. Wear a helmet and other protective equipment during sports activities. If you have firearms in your house, make sure you follow all gun safety procedures. Seek help if you have been physically or sexually abused. What's next? Go to your health care provider once a year for an annual wellness visit. Ask your health care provider how often you should have your eyes and teeth checked. Stay up to date on all vaccines. This information is not intended to replace advice given to you by your health care provider. Make sure you discuss any questions you have with your health care provider. Document Revised: 10/13/2020 Document Reviewed: 10/13/2020 Elsevier Patient Education  [place].

## 2022-01-10 NOTE — Assessment & Plan Note (Signed)
Stable mood with zoloft ?Med refill sent ?

## 2022-01-17 ENCOUNTER — Other Ambulatory Visit (INDEPENDENT_AMBULATORY_CARE_PROVIDER_SITE_OTHER): Payer: No Typology Code available for payment source

## 2022-01-17 DIAGNOSIS — Z0001 Encounter for general adult medical examination with abnormal findings: Secondary | ICD-10-CM

## 2022-01-17 DIAGNOSIS — Z13220 Encounter for screening for lipoid disorders: Secondary | ICD-10-CM | POA: Diagnosis not present

## 2022-01-17 DIAGNOSIS — Z136 Encounter for screening for cardiovascular disorders: Secondary | ICD-10-CM | POA: Diagnosis not present

## 2022-01-17 LAB — CBC WITH DIFFERENTIAL/PLATELET
Basophils Absolute: 0.1 10*3/uL (ref 0.0–0.1)
Basophils Relative: 1.5 % (ref 0.0–3.0)
Eosinophils Absolute: 0.3 10*3/uL (ref 0.0–0.7)
Eosinophils Relative: 4.6 % (ref 0.0–5.0)
HCT: 35 % — ABNORMAL LOW (ref 36.0–46.0)
Hemoglobin: 11.3 g/dL — ABNORMAL LOW (ref 12.0–15.0)
Lymphocytes Relative: 28 % (ref 12.0–46.0)
Lymphs Abs: 1.5 10*3/uL (ref 0.7–4.0)
MCHC: 32.4 g/dL (ref 30.0–36.0)
MCV: 84.5 fl (ref 78.0–100.0)
Monocytes Absolute: 0.5 10*3/uL (ref 0.1–1.0)
Monocytes Relative: 10 % (ref 3.0–12.0)
Neutro Abs: 3 10*3/uL (ref 1.4–7.7)
Neutrophils Relative %: 55.9 % (ref 43.0–77.0)
Platelets: 239 10*3/uL (ref 150.0–400.0)
RBC: 4.14 Mil/uL (ref 3.87–5.11)
RDW: 15 % (ref 11.5–15.5)
WBC: 5.4 10*3/uL (ref 4.0–10.5)

## 2022-01-17 LAB — COMPREHENSIVE METABOLIC PANEL
ALT: 13 U/L (ref 0–35)
AST: 18 U/L (ref 0–37)
Albumin: 4.2 g/dL (ref 3.5–5.2)
Alkaline Phosphatase: 91 U/L (ref 39–117)
BUN: 10 mg/dL (ref 6–23)
CO2: 28 mEq/L (ref 19–32)
Calcium: 9.6 mg/dL (ref 8.4–10.5)
Chloride: 103 mEq/L (ref 96–112)
Creatinine, Ser: 0.69 mg/dL (ref 0.40–1.20)
GFR: 115.46 mL/min (ref >60.00)
Glucose, Bld: 78 mg/dL (ref 70–99)
Potassium: 4.4 mEq/L (ref 3.5–5.1)
Sodium: 138 mEq/L (ref 135–145)
Total Bilirubin: 0.2 mg/dL (ref 0.2–1.2)
Total Protein: 7.4 g/dL (ref 6.0–8.3)

## 2022-01-17 LAB — LIPID PANEL
Cholesterol: 171 mg/dL (ref 0–200)
HDL: 51.7 mg/dL (ref >39.00)
LDL Cholesterol: 106 mg/dL — ABNORMAL HIGH (ref 0–99)
NonHDL: 118.82
Total CHOL/HDL Ratio: 3
Triglycerides: 62 mg/dL (ref 0.0–149.0)
VLDL: 12.4 mg/dL (ref 0.0–40.0)

## 2022-01-17 LAB — TSH: TSH: 1.52 u[IU]/mL (ref 0.35–5.50)

## 2022-02-20 ENCOUNTER — Ambulatory Visit: Payer: No Typology Code available for payment source | Admitting: Nurse Practitioner

## 2022-05-04 ENCOUNTER — Other Ambulatory Visit (HOSPITAL_COMMUNITY): Payer: Self-pay

## 2022-06-10 ENCOUNTER — Other Ambulatory Visit (HOSPITAL_COMMUNITY): Payer: Self-pay

## 2022-06-10 ENCOUNTER — Telehealth: Payer: 59 | Admitting: Nurse Practitioner

## 2022-06-10 DIAGNOSIS — B3731 Acute candidiasis of vulva and vagina: Secondary | ICD-10-CM

## 2022-06-10 MED ORDER — FLUCONAZOLE 150 MG PO TABS
150.0000 mg | ORAL_TABLET | Freq: Once | ORAL | 0 refills | Status: AC
  Filled 2022-06-10: qty 1, 1d supply, fill #0

## 2022-06-10 NOTE — Addendum Note (Signed)
Addended by: Chevis Pretty on: 06/10/2022 09:45 AM   Modules accepted: Orders

## 2022-06-10 NOTE — Progress Notes (Signed)

## 2022-06-30 IMAGING — DX DG WRIST COMPLETE 3+V*L*
3 series · 3 of 3 positions shown · non-contrast
Comparison: None.

CLINICAL DATA: Fell with pain and bruising.

EXAM:
LEFT WRIST - COMPLETE 3+ VIEW

[wrist ap]
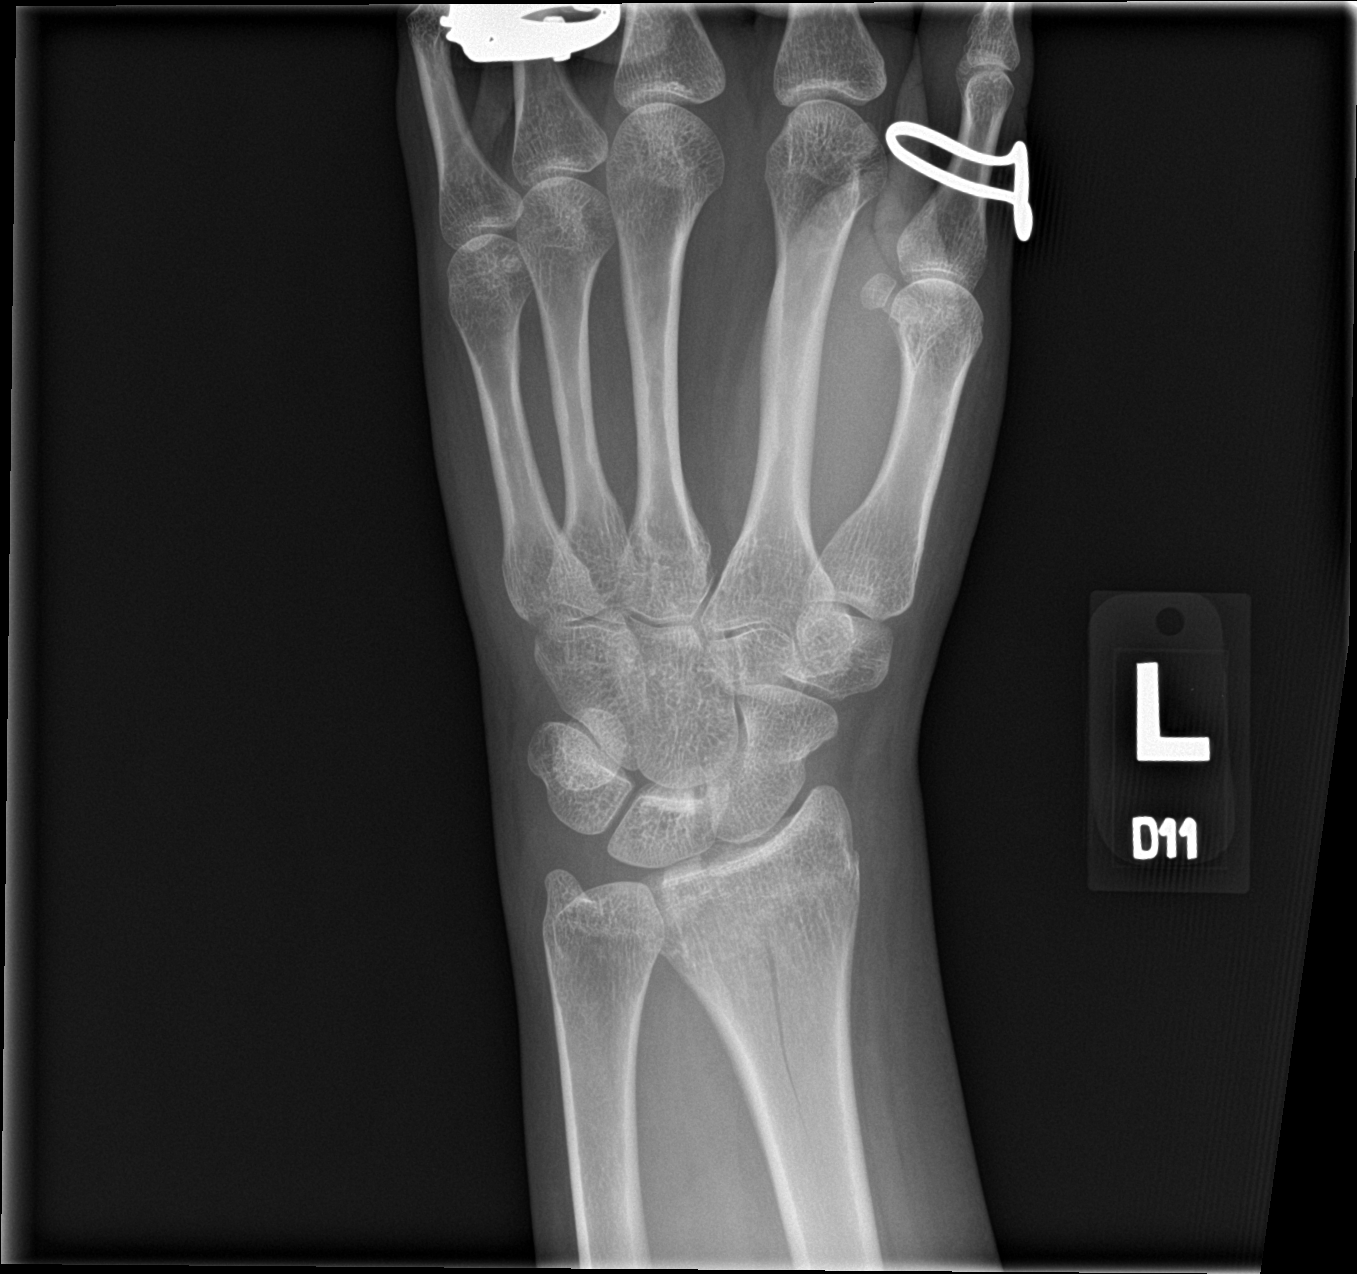

[wrist obl]
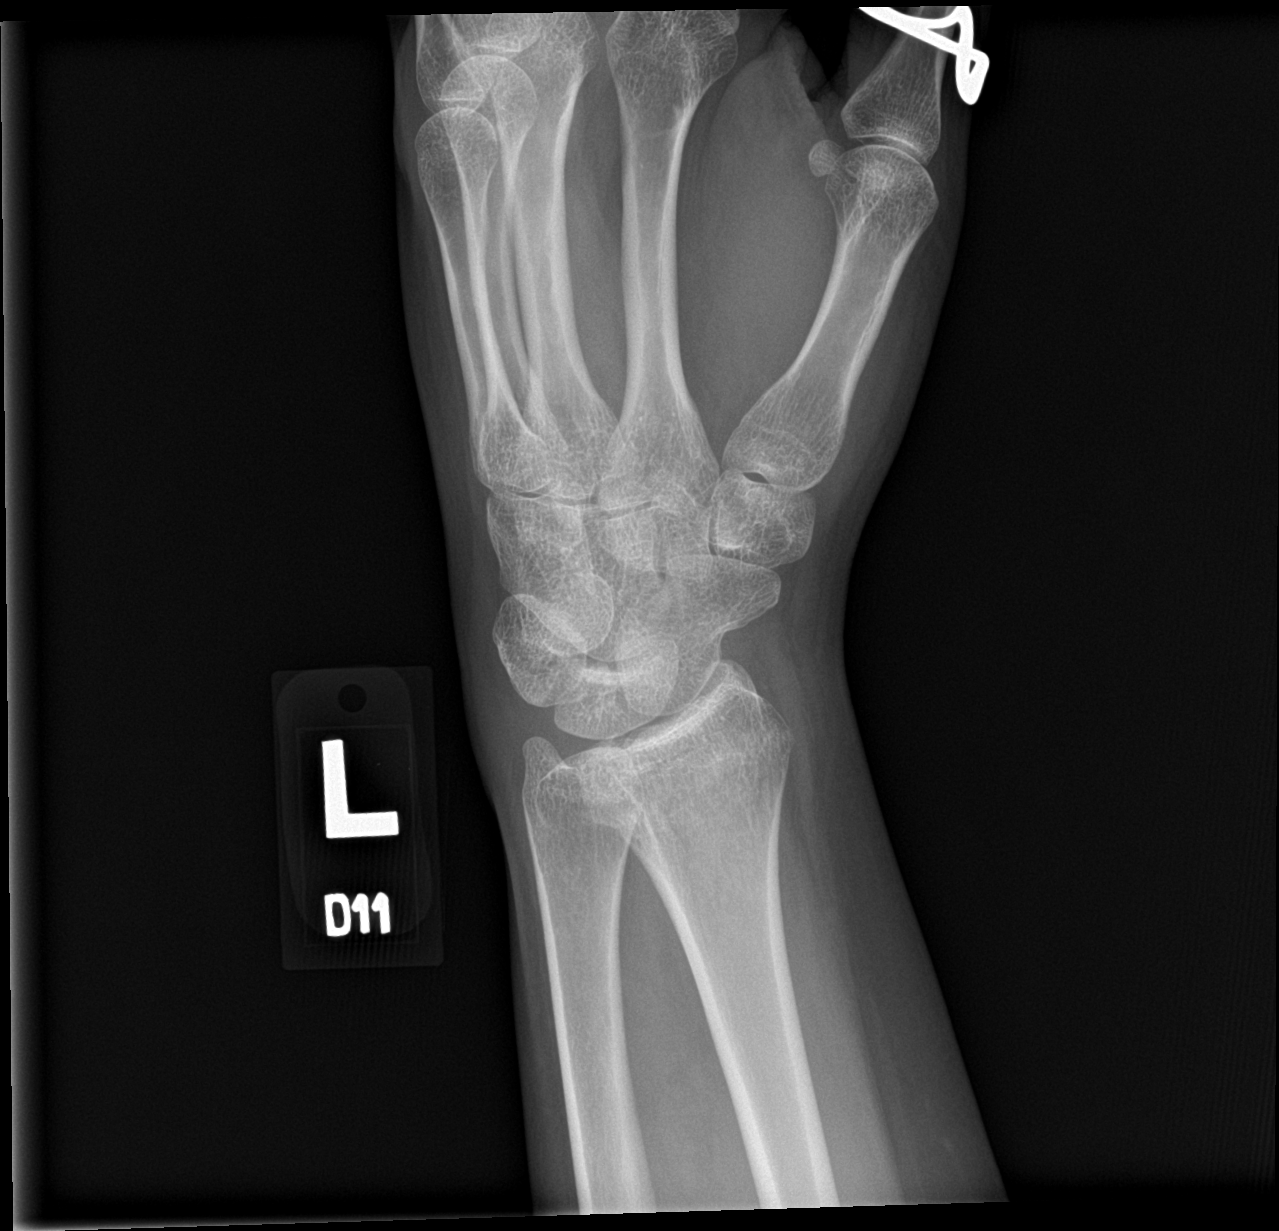

[wrist lat]
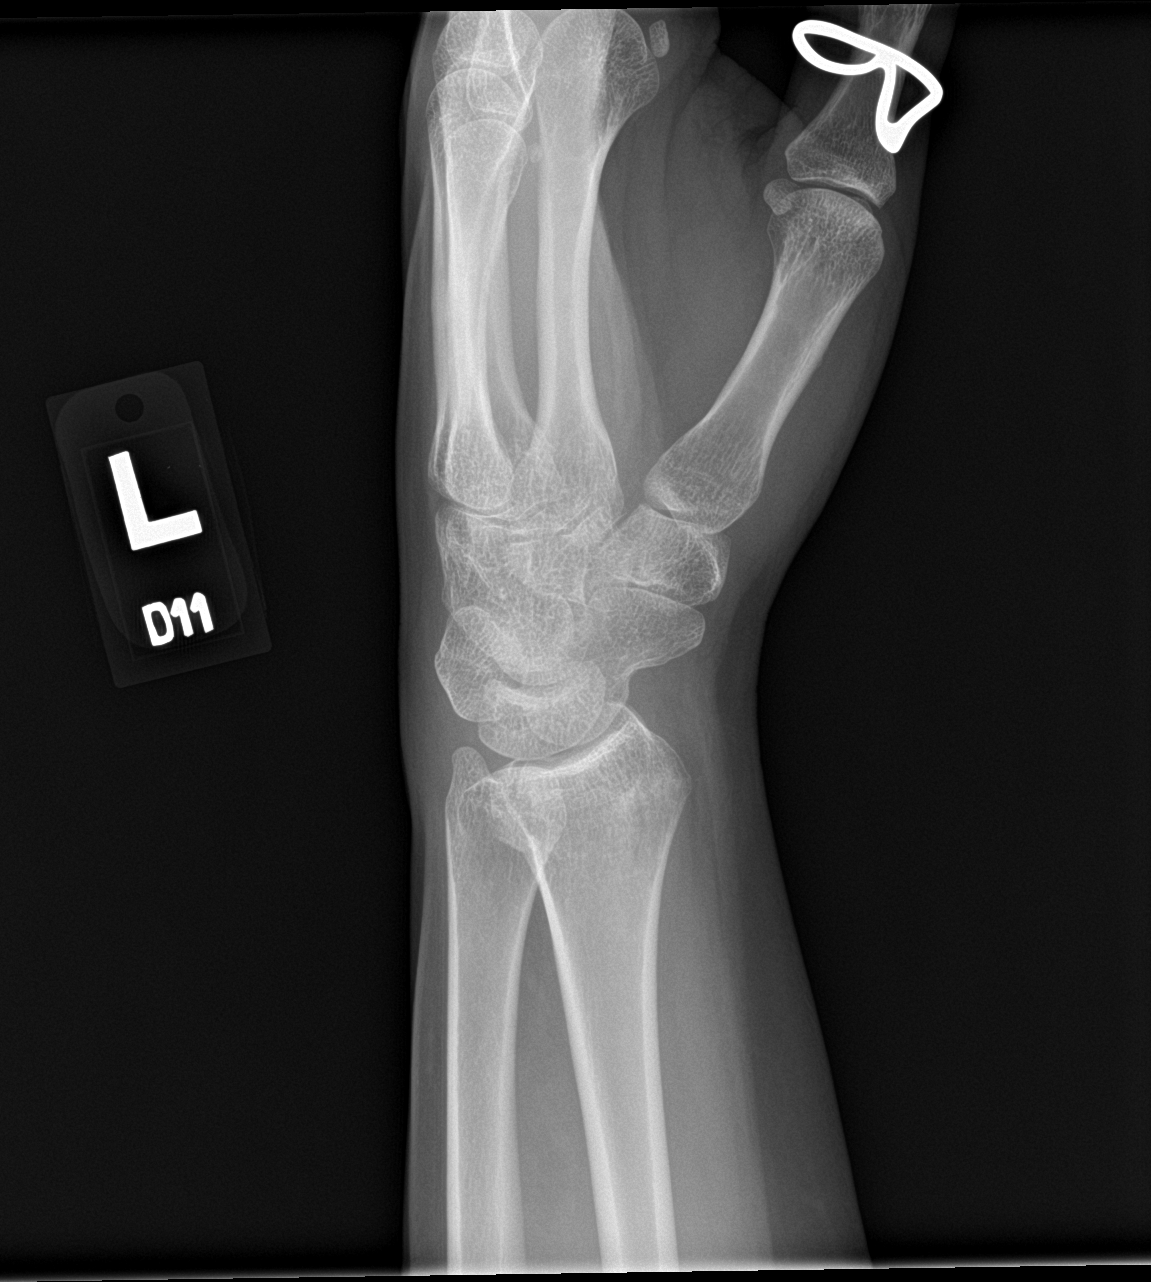

[3 of 3 positions shown; findings below may reference images not displayed]

FINDINGS: Fracture of the distal radius. One fracture line extends to the
distal radial articular surface centrally. Fracture line extends in
the coronal plane into the metaphyseal region. No evidence of
displacement or angulation. No fracture of the ulna or of the carpal
bones.
IMPRESSION: Nondisplaced fracture of the distal radius with extension to the
articular surface.

## 2022-07-03 IMAGING — CT CT WRIST*L* W/O CM
1 series · 12 of 14 positions shown, 15 images · non-contrast
Comparison: X-ray 03/15/2020

CLINICAL DATA: Distal radius fracture.  Date of injury 03/15/2020

EXAM:
CT OF THE LEFT WRIST WITHOUT CONTRAST
TECHNIQUE: Multidetector CT imaging was performed according to the standard
protocol. Multiplanar CT image reconstructions were also generated.

[Series 3: ext-soft · axial · 0.21mm/px · z∈[+56,+144]mm · 12 of 54 slices shown, 15 images]
[im 5/54  soft-tissue]
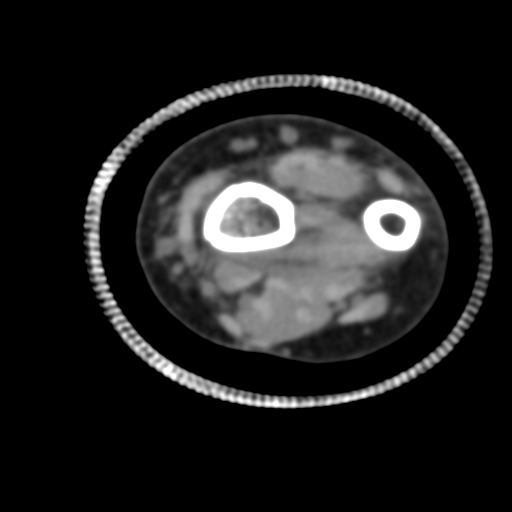
[im 5/54  bone]
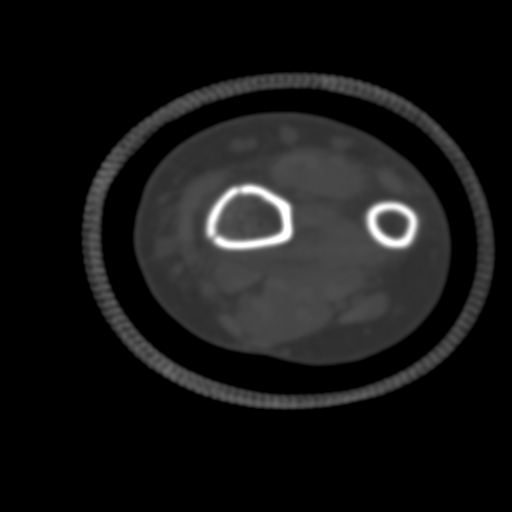
[im 9/54  bone]
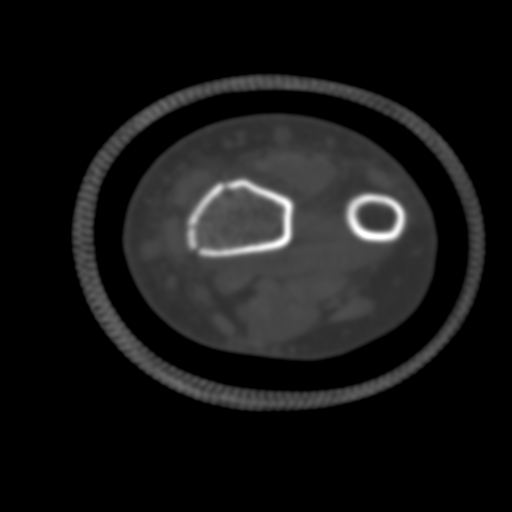
[im 13/54  bone]
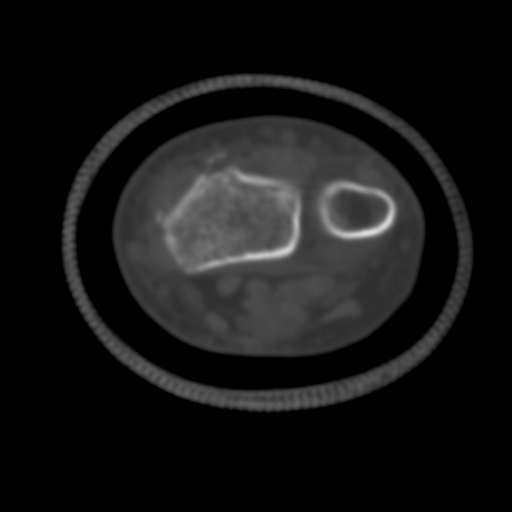
[im 17/54  bone]
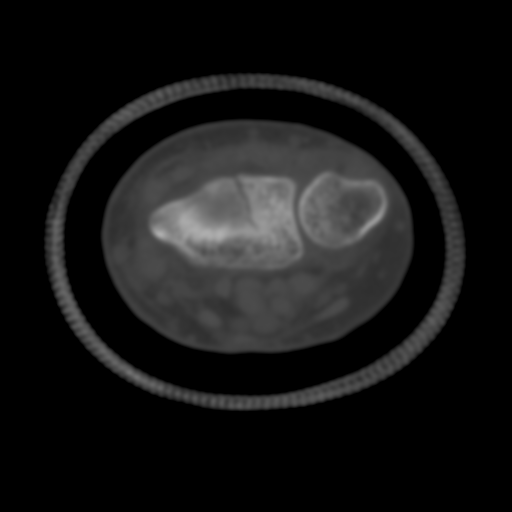
[im 21/54  soft-tissue]
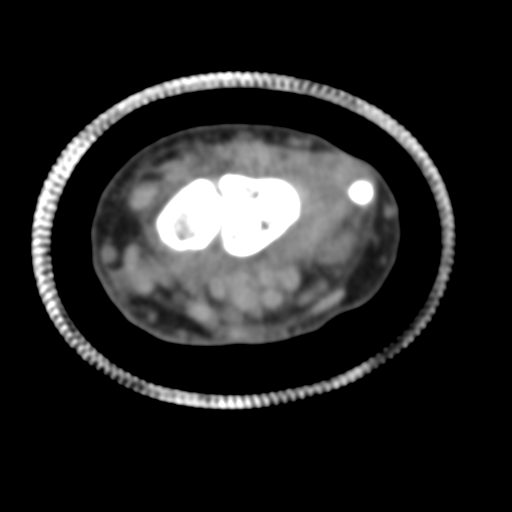
[im 21/54  bone]
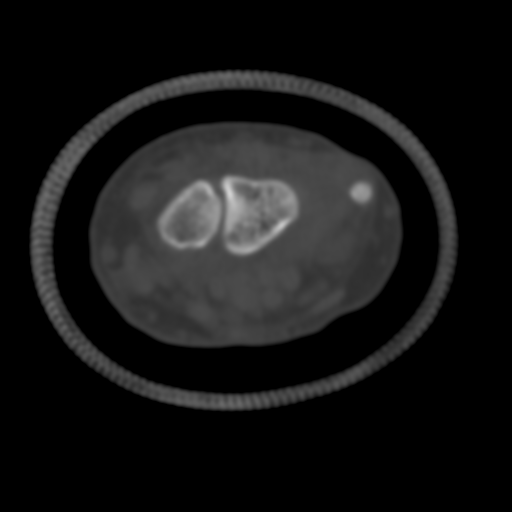
[im 25/54  bone]
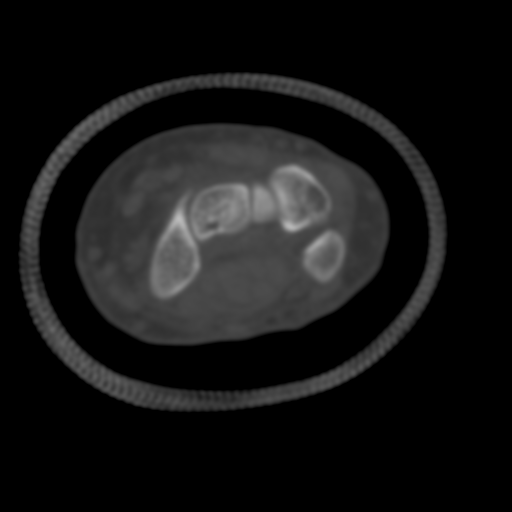
[im 29/54  bone]
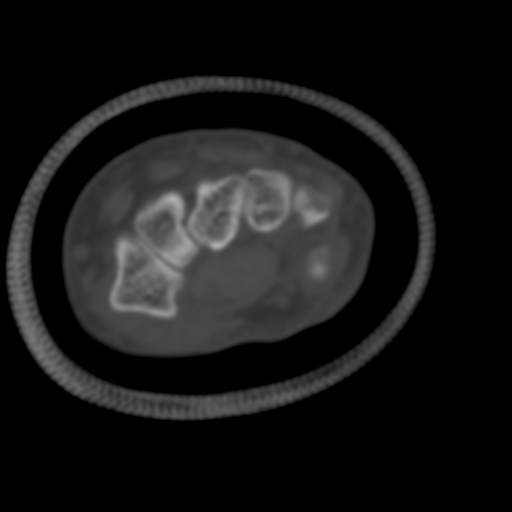
[im 33/54  bone]
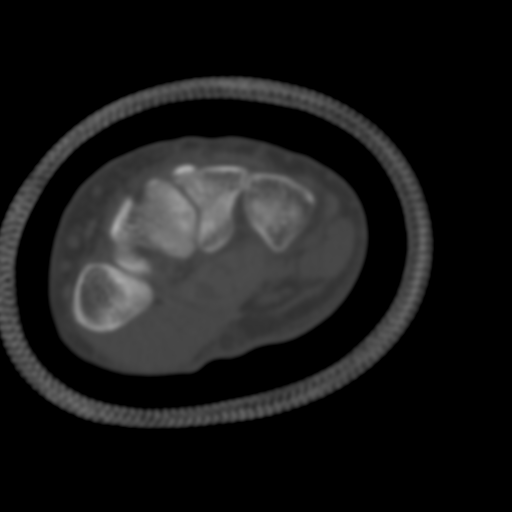
[im 37/54  soft-tissue]
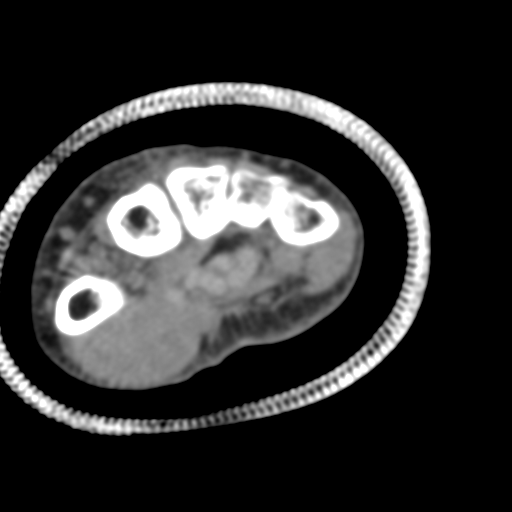
[im 37/54  bone]
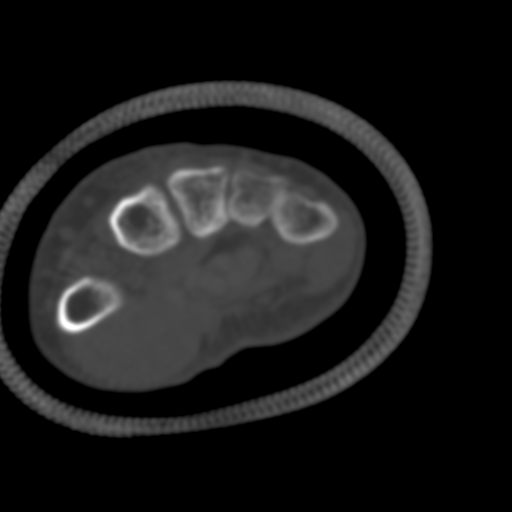
[im 41/54  bone]
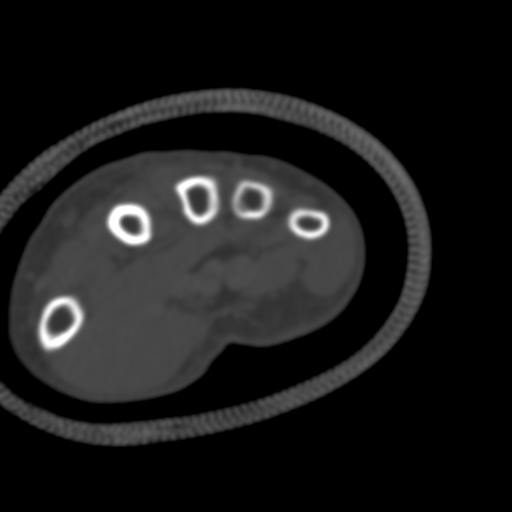
[im 45/54  bone]
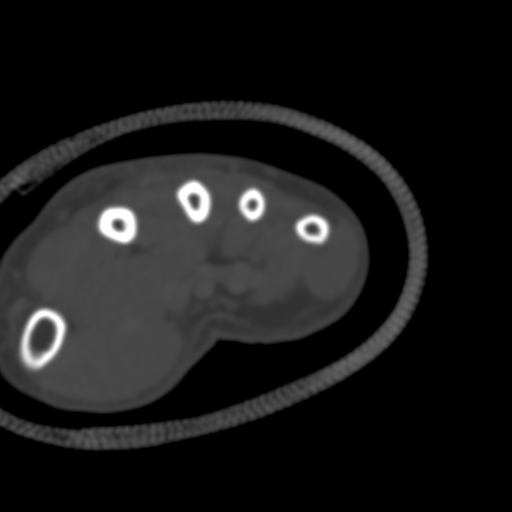
[im 49/54  bone]
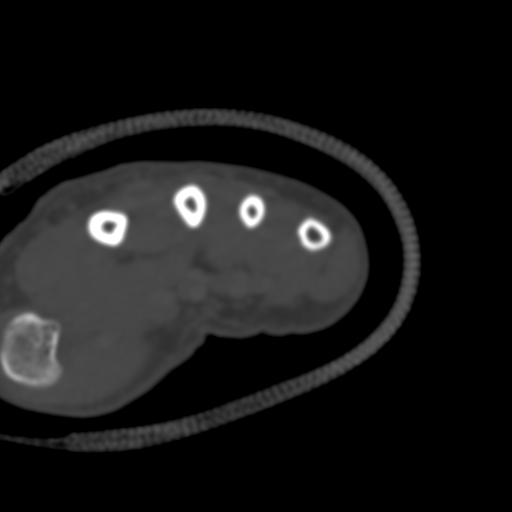

[12 of 14 positions shown; findings below may reference images not displayed]

FINDINGS: Bones/Joint/Cartilage

Acute mildly comminuted intra-articular fracture of the distal
radius with minimal impaction. 1.5 mm of articular-surface
depression along the dorsal fracture margin (series 7, image 31).
Minimally displaced cortical fragments along the dorsal surface
including involvement of Lister's tubercle (series 4, image 42;
series 9, image 31). Nondisplaced vertically oriented fracture
component extends into the distal metadiaphysis along the dorsal and
lateral cortex. Alignment of the distal radioulnar and
radiocapitellar joints remain anatomic without dislocation. Carpal
bones and carpal intraosseous spaces are intact. No significant
arthropathy. No additional fractures.

Ligaments

Suboptimally assessed by CT.

Muscles and Tendons

The tendons of the second, third, and fourth extensor compartments
closely approximate the fracture sites at the dorsal wrist.
Unremarkable muscle bulk.

Soft tissues

Soft tissue swelling at the fracture site without focal soft tissue
hematoma.
IMPRESSION: 1. Acute mildly comminuted intra-articular fracture of the distal
radius with minimal impaction. 1.5 mm of articular-surface
depression along the dorsal fracture margin.
2. Minimally displaced cortical fragments along the dorsal surface
including involvement of Lister's tubercle.
3. The tendons of the second, third, and fourth extensor
compartments closely approximate the fracture sites at the dorsal
wrist.

## 2022-07-11 ENCOUNTER — Other Ambulatory Visit (HOSPITAL_COMMUNITY): Payer: Self-pay

## 2022-07-11 ENCOUNTER — Ambulatory Visit (INDEPENDENT_AMBULATORY_CARE_PROVIDER_SITE_OTHER): Payer: 59 | Admitting: Family Medicine

## 2022-07-11 VITALS — BP 118/70 | HR 74 | Temp 97.7°F | Ht 67.0 in | Wt 176.8 lb

## 2022-07-11 DIAGNOSIS — S52502A Unspecified fracture of the lower end of left radius, initial encounter for closed fracture: Secondary | ICD-10-CM

## 2022-07-11 DIAGNOSIS — S52502S Unspecified fracture of the lower end of left radius, sequela: Secondary | ICD-10-CM | POA: Diagnosis not present

## 2022-07-11 DIAGNOSIS — M778 Other enthesopathies, not elsewhere classified: Secondary | ICD-10-CM

## 2022-07-11 DIAGNOSIS — M67432 Ganglion, left wrist: Secondary | ICD-10-CM

## 2022-07-11 HISTORY — DX: Ganglion, left wrist: M67.432

## 2022-07-11 HISTORY — DX: Unspecified fracture of the lower end of left radius, initial encounter for closed fracture: S52.502A

## 2022-07-11 HISTORY — DX: Other enthesopathies, not elsewhere classified: M77.8

## 2022-07-11 MED ORDER — NAPROXEN 500 MG PO TABS
500.0000 mg | ORAL_TABLET | Freq: Two times a day (BID) | ORAL | 0 refills | Status: DC
  Filled 2022-07-11: qty 30, 15d supply, fill #0

## 2022-07-11 NOTE — Progress Notes (Signed)
[facility] [address] [place] [address] Dept: [phone] Dept Fax: [phone]  Office Visit  Subjective:    Patient ID: Valerie Graham, female    DOB: Sep 25, 1990, 32 y.o..   MRN: [id number]  Chief Complaint  Patient presents with   Wrist Pain    C/o having LT wrist pain x 2 months. Also having pain LT arm/shoulder.  No OTC meds taken    History of Present Illness:  Patient is in today with a 29-monthhistory of left forearm/wrist pain. She notes she has some mild swelling tot he dorsum of the forearm. She also thinks she may have developed a cyst near her wrist. MS. TSielinghad a fracture of her left distal radius 2 years ago. She was treated with casting. She works at a computer all day. She notes that sometimes she has pain radiating up into her shoulder.  Past Medical History: Patient Active Problem List   Diagnosis Date Noted   Ganglion cyst of dorsum of left wrist 07/11/2022   Closed fracture of left distal radius 07/11/2022   Tendinitis of extensor tendon of left hand 07/11/2022   S/P cesarean section 07/19/2021   GAD (generalized anxiety disorder) 12/12/2019   Family history of prostate cancer    Family history of ovarian cancer    Family history of skin cancer    Past Surgical History:  Procedure Laterality Date   CESAREAN SECTION N/A 07/19/2021   Procedure: CESAREAN SECTION;  Surgeon: BSherlyn Hay DO;  Location: [facility] LD ORS;  Service: Obstetrics;  Laterality: N/A;   TONSILLECTOMY  2014   WISDOM TOOTH EXTRACTION     Family History  Problem Relation Age of Onset   Cancer Paternal Grandfather    Cancer Maternal Grandmother [phone]      breast   Cancer Father 441      bladder/prostate metastatic, neg genetic testing 2019   Endometriosis Mother 339      hysterectomy   Diabetes Maternal Aunt    Cancer Paternal Uncle 539      prostate   Stroke Paternal Uncle    Cancer Paternal Aunt        ovarian    Cancer Other        colon cancer   Outpatient Medications Prior to Visit  Medication Sig Dispense Refill   sertraline (ZOLOFT) 50 MG tablet Take 1 tablet (50 mg total) by mouth daily. 90 tablet 3   albuterol (PROAIR HFA) 108 (90 Base) MCG/ACT inhaler Inhale 2 puffs every 4 hours as directed. (Patient not taking: Reported on 07/11/2022) 36 g 6   fluticasone-salmeterol (ADVAIR) 250-50 MCG/ACT AEPB Inhale 1 puff by mouth into the lungs twice daily 120 each 4   No facility-administered medications prior to visit.   Allergies  Allergen Reactions   Penicillins Other (See Comments)    Reaction occurred as a child and pt is uncertain what the reaction was     Objective:   Today's Vitals   07/11/22 1604  BP: 118/70  Pulse: 74  Temp: 97.7 F (36.5 C)  TempSrc: Temporal  SpO2: 98%  Weight: 176 lb 12.8 oz (80.2 kg)  Height: '5\' 7"'$  (1.702 m)   Body mass index is 27.69 kg/m.   General: Well developed, well nourished. No acute distress. Extremities: Full ROM. There is mild swelling to the soft tissue over the dorsum of the forearm. The   radioulnar joint has no laxity. There is a  2-3 cm cyst palpable over the dorsal aspect of the wrist. Psych: Alert and oriented. Normal mood and affect.  There are no preventive care reminders to display for this patient.  Assessment & Plan:   1. Tendinitis of extensor tendon of left hand 2. Closed fracture of distal end of left radius, unspecified fracture morphology, sequela 3. Ganglion cyst of dorsum of left wrist [name] is having some extensor tendinitis and has a ganglion cyst that has developed. This is likely due to inadequate rehab form her radial fracture 2 years ago and from repetitive work doing Psychologist, counselling. I will refer her to PT. I will treat her with a course of naproxen. If not improving, would consider orthopedic referral.   - Ambulatory referral to Physical Therapy - naproxen (NAPROSYN) 500 MG tablet; Take 1 tablet (500 mg total)  by mouth 2 (two) times daily with a meal.  Dispense: 30 tablet; Refill: 0  Return in about 4 weeks (around 08/08/2022), or if symptoms worsen or fail to improve.   Haydee Salter, MD

## 2022-08-02 ENCOUNTER — Encounter (HOSPITAL_COMMUNITY): Payer: Self-pay | Admitting: Pharmacist

## 2022-08-02 ENCOUNTER — Other Ambulatory Visit (HOSPITAL_COMMUNITY): Payer: Self-pay

## 2022-08-02 MED ORDER — "BD LUER-LOK SYRINGE 18G X 1-1/2"" 3 ML MISC"
3 refills | Status: DC
  Filled 2022-08-02: qty 30, 30d supply, fill #0

## 2022-08-02 MED ORDER — METHYLPREDNISOLONE 8 MG PO TABS
8.0000 mg | ORAL_TABLET | Freq: Two times a day (BID) | ORAL | 1 refills | Status: DC
  Filled 2022-08-02: qty 8, 4d supply, fill #0
  Filled 2022-09-26: qty 8, 4d supply, fill #1

## 2022-08-02 MED ORDER — ESTRADIOL 0.1 MG/24HR TD PTTW
MEDICATED_PATCH | TRANSDERMAL | 3 refills | Status: DC
  Filled 2022-08-02: qty 8, 28d supply, fill #0
  Filled 2022-09-26: qty 8, 28d supply, fill #1
  Filled 2022-10-05: qty 8, 28d supply, fill #2

## 2022-08-02 MED ORDER — PROGESTERONE 50 MG/ML IM OIL
TOPICAL_OIL | INTRAMUSCULAR | 3 refills | Status: DC
  Filled 2022-08-02: qty 30, 30d supply, fill #0
  Filled 2022-10-17: qty 30, 30d supply, fill #1
  Filled 2022-10-31 – 2022-11-03 (×2): qty 30, 30d supply, fill #2

## 2022-08-02 MED ORDER — "NEEDLE (DISP) 22G X 1-1/2"" MISC"
3 refills | Status: DC
  Filled 2022-08-02 (×2): qty 30, 30d supply, fill #0
  Filled 2022-10-24 – 2022-10-25 (×2): qty 30, 30d supply, fill #1

## 2022-08-02 MED ORDER — ESTRADIOL 2 MG PO TABS
2.0000 mg | ORAL_TABLET | Freq: Two times a day (BID) | ORAL | 3 refills | Status: DC
  Filled 2022-08-02: qty 60, 30d supply, fill #0
  Filled 2022-10-09: qty 60, 30d supply, fill #1

## 2022-08-03 ENCOUNTER — Other Ambulatory Visit (HOSPITAL_COMMUNITY): Payer: Self-pay

## 2022-08-03 ENCOUNTER — Other Ambulatory Visit: Payer: Self-pay

## 2022-08-08 ENCOUNTER — Ambulatory Visit: Payer: 59 | Attending: Family Medicine

## 2022-08-08 ENCOUNTER — Other Ambulatory Visit (HOSPITAL_COMMUNITY): Payer: Self-pay

## 2022-08-08 ENCOUNTER — Other Ambulatory Visit: Payer: Self-pay

## 2022-08-08 DIAGNOSIS — M67432 Ganglion, left wrist: Secondary | ICD-10-CM | POA: Insufficient documentation

## 2022-08-08 DIAGNOSIS — R29898 Other symptoms and signs involving the musculoskeletal system: Secondary | ICD-10-CM | POA: Diagnosis not present

## 2022-08-08 DIAGNOSIS — M7542 Impingement syndrome of left shoulder: Secondary | ICD-10-CM | POA: Insufficient documentation

## 2022-08-08 DIAGNOSIS — M25532 Pain in left wrist: Secondary | ICD-10-CM | POA: Diagnosis not present

## 2022-08-08 DIAGNOSIS — S52502S Unspecified fracture of the lower end of left radius, sequela: Secondary | ICD-10-CM | POA: Diagnosis not present

## 2022-08-08 DIAGNOSIS — M778 Other enthesopathies, not elsewhere classified: Secondary | ICD-10-CM | POA: Insufficient documentation

## 2022-08-08 NOTE — Therapy (Signed)
OUTPATIENT PHYSICAL THERAPY UPPER EXTREMITY EVALUATION   Patient Name: Valerie AlyHeather Graham MRN: [id number] DOB:[birth year], 32 y.o., female Today's Date: 08/08/2022  END OF SESSION:  PT End of Session - 08/08/22 1709     Visit Number 1    Date for PT Re-Evaluation 10/03/22    Progress Note Due on Visit 10    PT Start Time 1615    PT Stop Time 1705    PT Time Calculation (min) 50 min             Past Medical History:  Diagnosis Date   Anxiety    Chicken pox    Depression    Family history of ovarian cancer    Family history of prostate cancer    Family history of skin cancer    Herpes simplex without mention of complication 07/02/11   Past Surgical History:  Procedure Laterality Date   CESAREAN SECTION N/A 07/19/2021   Procedure: CESAREAN SECTION;  Surgeon: Edwinna AreolaBanga, Cecilia Worema, DO;  Location: [facility] ORS;  Service: Obstetrics;  Laterality: N/A;   TONSILLECTOMY  2014   WISDOM TOOTH EXTRACTION     Patient Active Problem List   Diagnosis Date Noted   Ganglion cyst of dorsum of left wrist 07/11/2022   Closed fracture of left distal radius 07/11/2022   Tendinitis of extensor tendon of left hand 07/11/2022   S/P cesarean section 07/19/2021   GAD (generalized anxiety disorder) 12/12/2019   Family history of prostate cancer    Family history of ovarian cancer    Family history of skin cancer     PCP: Hinton Dyerharlotte Lum Niche, NP  REFERRING PROVIDER: Herbie DrapeStephen Rudd, MD  REFERRING DIAG: L wrist pain  THERAPY DIAG:  Pain in left wrist - Plan: PT plan of care cert/re-cert  Weakness of left shoulder - Plan: PT plan of care cert/re-cert  Impingement syndrome of left shoulder - Plan: PT plan of care cert/re-cert  Rationale for Evaluation and Treatment: Rehabilitation  ONSET DATE: 6 months  SUBJECTIVE:                                                                                                                                                                                       SUBJECTIVE STATEMENT: L wrist fracture 2 years ago, 6 months ago developed cyst L dorsal wrist, also reports numbness/ tingling L shoulder medial border of shoulder blade, more so with activity, such as with yard work this weekend Hand dominance: Right  PERTINENT HISTORY: Larey SeatFell down steps 2 years ago, caught herself with L hand behind her and fractured L radial head.  Casted.  6 months ago started having increased L dorsal wrist  pain and L arm "heaviness" pain running from wrist to shoulder. Developed ganglion cyst L dorsal wrist as well.  Referred to PT for eval and Rx .  PAIN:  Are you having pain? Yes: NPRS scale: 2/10 Pain location: L dorsal wrist, L superior/anterior shoulder, L medial scapular border Pain description: ebbs and flows, worse with activity Aggravating factors: typing, yard work Relieving factors: rest  PRECAUTIONS: None  WEIGHT BEARING RESTRICTIONS: No  FALLS:  Has patient fallen in last 6 months? No  LIVING ENVIRONMENT: Lives with: lives with their spouse Lives in: House/apartment Stairs: Yes: Internal: 8 steps; can reach both and External: 0 steps; none Has following equipment at home: None  OCCUPATION: Works at a computer all day, from home   PLOF: Independent  PATIENT GOALS: Manage this pain and relieve Sx  NEXT MD VISIT: unknown  OBJECTIVE:   DIAGNOSTIC FINDINGS:  na  PATIENT SURVEYS :  Quick DASH: 38.6%  COGNITION: Overall cognitive status: Within functional limits for tasks assessed     SENSATION: Light touch: WFL  POSTURE: Elevated L shoulder and upwardly rotated L scapula  UPPER EXTREMITY ROM:   AAROM ROM Right eval Left eval  Shoulder flexion All wfl 155  Shoulder extension  Wfl  Shoulder abduction  155  Shoulder adduction  wfl  Shoulder internal rotation  wnl  Shoulder external rotation  wnl  Elbow flexion  wnl  Elbow extension  wnl  Wrist flexion  75  Wrist extension  wnl  Wrist ulnar deviation  nt  Wrist radial  deviation  nt  Wrist pronation  wnl  Wrist supination  wnl    UPPER EXTREMITY MMT:  MMT Right eval Left eval  Shoulder flexion  3+  Shoulder extension  3+  Shoulder abduction  3+  Shoulder adduction  nt  Shoulder internal rotation  wnl  Shoulder external rotation  wnl  Middle trapezius  3  Lower trapezius  3  Elbow flexion  wnl  Elbow extension  wnl  Wrist flexion  wnl  Wrist extension  4  Wrist ulnar deviation  nt  Wrist radial deviation  nt  Wrist pronation  nt  Wrist supination  nt  Grip strength (lbs) wfl wfl    SHOULDER SPECIAL TESTS: Impingement tests: Neer impingement test: positive  Instability tests: Load and shift test: positive , Jobes relocation test: positive , Apprehension test: positive , and Sulcus sign: negative Rotator cuff assessment: External rotation lag sign: negative, Belly press test: negative, and Infraspinatus test: negative Biceps assessment: Yergason's test: negative and Speed's test: negative  JOINT MOBILITY TESTING:  No excessive jt mobilty noted, some posterior capsular restrictoin noted  PALPATION:  Very tender middle traps, T 4-6 spinous processes and L transverse processes, teres minor, axilla, serratus ant.L dorsal wrist mild tenderness   TODAY'S TREATMENT:  DATE: 08/08/22: Kinesiotaping, 3- I pieces designed to assist with proprioceptive input and motor control/stability posterior shoulder musculature, and to assist with centralization of L humeral head. Instructed in strengthening /muscle re education for middle traps and shoulder ER's with yellow t band.  Verbal, tactile cues for avoiding shrugging L shoulder  PATIENT EDUCATION: Education details: POC, goals, expectations for recovery Person educated: Patient Education method: Explanation, Demonstration, Tactile cues, Verbal cues, and  Handouts Education comprehension: verbalized understanding, returned demonstration, verbal cues required, tactile cues required, and needs further education  HOME EXERCISE PROGRAM: Access Code: [email or web address] URL: [email or web address] Date: 08/08/2022 Prepared by: Makalah Asberry  Exercises - Shoulder External Rotation and Scapular Retraction with Resistance  - 3 x daily - 7 x weekly - 1 sets - 10 reps - Scapular Retraction with Resistance  - 3 x daily - 7 x weekly - 1 sets - 10 reps  ASSESSMENT:  CLINICAL IMPRESSION: Patient is a 32 y.o. female who was seen today for physical therapy evaluation and treatment for L wrist pain and L shoulder pain.  During the evaluation, she had replication of L wrist symptoms when moving L shoulder into terminal flexion with overpressure, consistent with secondary rotator cuff impingement.  She also has weakness all three L deltoid muscles as well as middle and lower traps.  She does have some restriction with tightness L wrist flexion as well. Would benefit from skilled PT to address her Sx.  Recommend progressive motor retraining of her posterior shoulder and scapular musculature, with L wrist stretching.  Also recommend techniques to reduce the inflammation/irritation L scapular stabilizers including dry needling, thoracic mobs as needed, additional kinesiotaping.  Advised patient that if Sx not improving after 3 to 4 weeks will need to seek follow up with orthopedist..    OBJECTIVE IMPAIRMENTS: decreased activity tolerance, decreased endurance, decreased ROM, decreased strength, impaired flexibility, impaired UE functional use, and pain.   ACTIVITY LIMITATIONS: lifting, sleeping, bathing, dressing, reach over head, and caring for others  PARTICIPATION LIMITATIONS: community activity, occupation, and yard work  PERSONAL FACTORS: Time since onset of injury/illness/exacerbation are also affecting patient's functional outcome.   REHAB POTENTIAL:  Good  CLINICAL DECISION MAKING: Stable/uncomplicated  EVALUATION COMPLEXITY: Low  GOALS: Goals reviewed with patient? Yes  SHORT TERM GOALS: Target date: 2 weeks 08/22/22  I HEP Baseline:established today Goal status: INITIAL   LONG TERM GOALS: Target date: 10/03/22  Improve quick DASH score from  Baseline: 38.6% Goal status: INITIAL  2.  Improve strength L shoulder flex, abd, ext and middle traps, lower traps to wnl for stability /endurance for L UE use Baseline: 3/5 to 3+/5 Goal status: INITIAL  3.  Improve flexion ROM L wrist from 75 degrees to 90 degrees Baseline:  Goal status: INITIAL  4.  Full, pain free L shoulder elevation without L wrist pain Baseline:  Goal status: INITIAL  PLAN: PT FREQUENCY: 1-2x/week  PT DURATION: 8 weeks  PLANNED INTERVENTIONS: Therapeutic exercises, Therapeutic activity, Neuromuscular re-education, Balance training, Gait training, Patient/Family education, Self Care, and Joint mobilization  PLAN FOR NEXT SESSION: How were therex, how was tape, ? Dry needling L middle traps, scapular retractors, progress strengthening as appropriate   Alannah Averhart L Nyiesha Beever, PT 08/08/2022, 5:59 PM

## 2022-08-10 ENCOUNTER — Ambulatory Visit: Payer: 59

## 2022-08-10 DIAGNOSIS — M7542 Impingement syndrome of left shoulder: Secondary | ICD-10-CM | POA: Diagnosis not present

## 2022-08-10 DIAGNOSIS — M67432 Ganglion, left wrist: Secondary | ICD-10-CM | POA: Diagnosis not present

## 2022-08-10 DIAGNOSIS — R29898 Other symptoms and signs involving the musculoskeletal system: Secondary | ICD-10-CM | POA: Diagnosis not present

## 2022-08-10 DIAGNOSIS — S52502S Unspecified fracture of the lower end of left radius, sequela: Secondary | ICD-10-CM | POA: Diagnosis not present

## 2022-08-10 DIAGNOSIS — M778 Other enthesopathies, not elsewhere classified: Secondary | ICD-10-CM | POA: Diagnosis not present

## 2022-08-10 DIAGNOSIS — M25532 Pain in left wrist: Secondary | ICD-10-CM | POA: Diagnosis not present

## 2022-08-10 NOTE — Therapy (Signed)
OUTPATIENT PHYSICAL THERAPY TREATMENT   Patient Name: Valerie Graham MRN: [id number] DOB:[birth year], 32 y.o., female Today's Date: 08/10/2022  END OF SESSION:  PT End of Session - 08/10/22 1703     Visit Number 2    Date for PT Re-Evaluation 10/03/22    Progress Note Due on Visit 10    PT Start Time 1618    PT Stop Time 1700    PT Time Calculation (min) 42 min    Activity Tolerance Patient tolerated treatment well    Behavior During Therapy [facility] for tasks assessed/performed              Past Medical History:  Diagnosis Date   Anxiety    Chicken pox    Depression    Family history of ovarian cancer    Family history of prostate cancer    Family history of skin cancer    Herpes simplex without mention of complication 07/02/11   Past Surgical History:  Procedure Laterality Date   CESAREAN SECTION N/A 07/19/2021   Procedure: CESAREAN SECTION;  Surgeon: Edwinna Areola, DO;  Location: [facility] LD ORS;  Service: Obstetrics;  Laterality: N/A;   TONSILLECTOMY  2014   WISDOM TOOTH EXTRACTION     Patient Active Problem List   Diagnosis Date Noted   Ganglion cyst of dorsum of left wrist 07/11/2022   Closed fracture of left distal radius 07/11/2022   Tendinitis of extensor tendon of left hand 07/11/2022   S/P cesarean section 07/19/2021   GAD (generalized anxiety disorder) 12/12/2019   Family history of prostate cancer    Family history of ovarian cancer    Family history of skin cancer     PCP: Hinton Dyer, NP  REFERRING PROVIDER: Herbie Drape, MD  REFERRING DIAG: L wrist pain  THERAPY DIAG:  Pain in left wrist  Weakness of left shoulder  Impingement syndrome of left shoulder  Rationale for Evaluation and Treatment: Rehabilitation  ONSET DATE: 6 months  SUBJECTIVE:                                                                                                                                                                                       SUBJECTIVE STATEMENT: Pt reports no pain at the moment but sitting and typing aggravates her pain. Hand dominance: Right  PERTINENT HISTORY: Larey Seat down steps 2 years ago, caught herself with L hand behind her and fractured L radial head.  Casted.  6 months ago started having increased L dorsal wrist pain and L arm "heaviness" pain running from wrist to shoulder. Developed ganglion cyst L dorsal wrist as well.  Referred to PT for eval  and Rx .  PAIN:  Are you having pain? Yes: NPRS scale: 0/10 Pain location: L dorsal wrist, L superior/anterior shoulder, L medial scapular border Pain description: ebbs and flows, worse with activity Aggravating factors: typing, yard work Relieving factors: rest  PRECAUTIONS: None  WEIGHT BEARING RESTRICTIONS: No  FALLS:  Has patient fallen in last 6 months? No  LIVING ENVIRONMENT: Lives with: lives with their spouse Lives in: House/apartment Stairs: Yes: Internal: 8 steps; can reach both and External: 0 steps; none Has following equipment at home: None  OCCUPATION: Works at a computer all day, from home   PLOF: Independent  PATIENT GOALS: Manage this pain and relieve Sx  NEXT MD VISIT: unknown  OBJECTIVE:   DIAGNOSTIC FINDINGS:  na  PATIENT SURVEYS :  Quick DASH: 38.6%  COGNITION: Overall cognitive status: Within functional limits for tasks assessed     SENSATION: Light touch: WFL  POSTURE: Elevated L shoulder and upwardly rotated L scapula  UPPER EXTREMITY ROM:   AAROM ROM Right eval Left eval  Shoulder flexion All wfl 155  Shoulder extension  Wfl  Shoulder abduction  155  Shoulder adduction  wfl  Shoulder internal rotation  wnl  Shoulder external rotation  wnl  Elbow flexion  wnl  Elbow extension  wnl  Wrist flexion  75  Wrist extension  wnl  Wrist ulnar deviation  nt  Wrist radial deviation  nt  Wrist pronation  wnl  Wrist supination  wnl    UPPER EXTREMITY MMT:  MMT Right eval Left eval  Shoulder  flexion  3+  Shoulder extension  3+  Shoulder abduction  3+  Shoulder adduction  nt  Shoulder internal rotation  wnl  Shoulder external rotation  wnl  Middle trapezius  3  Lower trapezius  3  Elbow flexion  wnl  Elbow extension  wnl  Wrist flexion  wnl  Wrist extension  4  Wrist ulnar deviation  nt  Wrist radial deviation  nt  Wrist pronation  nt  Wrist supination  nt  Grip strength (lbs) wfl wfl    SHOULDER SPECIAL TESTS: Impingement tests: Neer impingement test: positive  Instability tests: Load and shift test: positive , Jobes relocation test: positive , Apprehension test: positive , and Sulcus sign: negative Rotator cuff assessment: External rotation lag sign: negative, Belly press test: negative, and Infraspinatus test: negative Biceps assessment: Yergason's test: negative and Speed's test: negative  JOINT MOBILITY TESTING:  No excessive jt mobilty noted, some posterior capsular restrictoin noted  PALPATION:  Very tender middle traps, T 4-6 spinous processes and L transverse processes, teres minor, axilla, serratus ant.L dorsal wrist mild tenderness   TODAY'S TREATMENT:                                                                                                                                         DATE: 08/10/22  Standing ER YTB x  10 against doorframe Standing horiz ABD YTB x 10 against doorframe Standing rows YTB x 10  Standing shoulder extension x 10 YTB Wall angels x 10 Standing thoracic ext at wall x 10  Open books x 10 S/L Thoracic mobilization on foam roll x 10  08/08/22: Kinesiotaping, 3- I pieces designed to assist with proprioceptive input and motor control/stability posterior shoulder musculature, and to assist with centralization of L humeral head. Instructed in strengthening /muscle re education for middle traps and shoulder ER's with yellow t band.  Verbal, tactile cues for avoiding shrugging L shoulder  PATIENT EDUCATION: Education details: POC,  goals, expectations for recovery Person educated: Patient Education method: Explanation, Demonstration, Tactile cues, Verbal cues, and Handouts Education comprehension: verbalized understanding, returned demonstration, verbal cues required, tactile cues required, and needs further education  HOME EXERCISE PROGRAM: Access Code: [email or web address] URL: [email or web address] Date: 08/10/2022 Prepared by: Verta Ellen  Exercises - Shoulder External Rotation and Scapular Retraction with Resistance  - 3 x daily - 7 x weekly - 1 sets - 10 reps - Scapular Retraction with Resistance  - 3 x daily - 7 x weekly - 1 sets - 10 reps - Standing Shoulder Horizontal Abduction with Resistance  - 1 x daily - 7 x weekly - 2 sets - 10 reps - Thoracic Extension Mobilization on Foam Roll  - 1 x daily - 7 x weekly - 2 sets - 10 reps - Sidelying Thoracic Rotation with Open Book  - 1 x daily - 7 x weekly - 2 sets - 10 reps - Wall Angels  - 1 x daily - 7 x weekly - 2 sets - 10 reps  ASSESSMENT:  CLINICAL IMPRESSION: Pt demonstrates good tolerance for TE. Continued progressing periscap strength and thoracic mobility. Tactile cues required to keep from shrugging shoulders. Otherwise patient showed a good response to treatment. HEP and info on DN given post session.    OBJECTIVE IMPAIRMENTS: decreased activity tolerance, decreased endurance, decreased ROM, decreased strength, impaired flexibility, impaired UE functional use, and pain.   ACTIVITY LIMITATIONS: lifting, sleeping, bathing, dressing, reach over head, and caring for others  PARTICIPATION LIMITATIONS: community activity, occupation, and yard work  PERSONAL FACTORS: Time since onset of injury/illness/exacerbation are also affecting patient's functional outcome.   REHAB POTENTIAL: Good  CLINICAL DECISION MAKING: Stable/uncomplicated  EVALUATION COMPLEXITY: Low  GOALS: Goals reviewed with patient? Yes  SHORT TERM GOALS: Target date: 2 weeks  08/22/22  I HEP Baseline:established today Goal status: INITIAL   LONG TERM GOALS: Target date: 10/03/22  Improve quick DASH score from  Baseline: 38.6% Goal status: INITIAL  2.  Improve strength L shoulder flex, abd, ext and middle traps, lower traps to wnl for stability /endurance for L UE use Baseline: 3/5 to 3+/5 Goal status: INITIAL  3.  Improve flexion ROM L wrist from 75 degrees to 90 degrees Baseline:  Goal status: INITIAL  4.  Full, pain free L shoulder elevation without L wrist pain Baseline:  Goal status: INITIAL  PLAN: PT FREQUENCY: 1-2x/week  PT DURATION: 8 weeks  PLANNED INTERVENTIONS: Therapeutic exercises, Therapeutic activity, Neuromuscular re-education, Balance training, Gait training, Patient/Family education, Self Care, and Joint mobilization  PLAN FOR NEXT SESSION: How were therex, how was tape, ? Dry needling L middle traps, scapular retractors, progress strengthening as appropriate   Darleene Cleaver, PTA 08/10/2022, 5:07 PM

## 2022-08-11 ENCOUNTER — Other Ambulatory Visit (HOSPITAL_COMMUNITY): Payer: Self-pay

## 2022-08-11 DIAGNOSIS — N711 Chronic inflammatory disease of uterus: Secondary | ICD-10-CM | POA: Diagnosis not present

## 2022-08-11 MED ORDER — DOXYCYCLINE HYCLATE 100 MG PO CAPS
100.0000 mg | ORAL_CAPSULE | Freq: Two times a day (BID) | ORAL | 2 refills | Status: DC
  Filled 2022-08-11: qty 10, 5d supply, fill #0

## 2022-08-15 ENCOUNTER — Other Ambulatory Visit (HOSPITAL_COMMUNITY): Payer: Self-pay

## 2022-08-21 ENCOUNTER — Ambulatory Visit: Payer: 59

## 2022-08-21 DIAGNOSIS — R29898 Other symptoms and signs involving the musculoskeletal system: Secondary | ICD-10-CM

## 2022-08-21 DIAGNOSIS — M7542 Impingement syndrome of left shoulder: Secondary | ICD-10-CM | POA: Diagnosis not present

## 2022-08-21 DIAGNOSIS — S52502S Unspecified fracture of the lower end of left radius, sequela: Secondary | ICD-10-CM | POA: Diagnosis not present

## 2022-08-21 DIAGNOSIS — M67432 Ganglion, left wrist: Secondary | ICD-10-CM | POA: Diagnosis not present

## 2022-08-21 DIAGNOSIS — M778 Other enthesopathies, not elsewhere classified: Secondary | ICD-10-CM | POA: Diagnosis not present

## 2022-08-21 DIAGNOSIS — M25532 Pain in left wrist: Secondary | ICD-10-CM

## 2022-08-21 NOTE — Therapy (Signed)
OUTPATIENT PHYSICAL THERAPY TREATMENT   Patient Name: Valerie Graham MRN: [id number] DOB:31-Oct-1990, 32 y.o., female Today's Date: 08/21/2022  END OF SESSION:  PT End of Session - 08/21/22 1620     Visit Number 3    Date for PT Re-Evaluation 10/03/22    Progress Note Due on Visit 10    PT Start Time 1618    PT Stop Time 1701    PT Time Calculation (min) 43 min    Activity Tolerance Patient tolerated treatment well    Behavior During Therapy [facility] for tasks assessed/performed               Past Medical History:  Diagnosis Date   Anxiety    Chicken pox    Depression    Family history of ovarian cancer    Family history of prostate cancer    Family history of skin cancer    Herpes simplex without mention of complication 07/02/11   Past Surgical History:  Procedure Laterality Date   CESAREAN SECTION N/A 07/19/2021   Procedure: CESAREAN SECTION;  Surgeon: Edwinna Areola, DO;  Location: [facility] LD ORS;  Service: Obstetrics;  Laterality: N/A;   TONSILLECTOMY  2014   WISDOM TOOTH EXTRACTION     Patient Active Problem List   Diagnosis Date Noted   Ganglion cyst of dorsum of left wrist 07/11/2022   Closed fracture of left distal radius 07/11/2022   Tendinitis of extensor tendon of left hand 07/11/2022   S/P cesarean section 07/19/2021   GAD (generalized anxiety disorder) 12/12/2019   Family history of prostate cancer    Family history of ovarian cancer    Family history of skin cancer     PCP: Hinton Dyer, NP  REFERRING PROVIDER: Herbie Drape, MD  REFERRING DIAG: L wrist pain  THERAPY DIAG:  Pain in left wrist  Weakness of left shoulder  Rationale for Evaluation and Treatment: Rehabilitation  ONSET DATE: 6 months  SUBJECTIVE:                                                                                                                                                                                      SUBJECTIVE STATEMENT: Pt reports dull,  nagging pain yesterday but overall she does pretty well with pain.  Hand dominance: Right  PERTINENT HISTORY: Larey Seat down steps 2 years ago, caught herself with L hand behind her and fractured L radial head.  Casted.  6 months ago started having increased L dorsal wrist pain and L arm "heaviness" pain running from wrist to shoulder. Developed ganglion cyst L dorsal wrist as well.  Referred to PT for eval and Rx .  PAIN:  Are you having pain? Yes: NPRS scale: 0/10 Pain location: L dorsal wrist, L superior/anterior shoulder, L medial scapular border Pain description: ebbs and flows, worse with activity Aggravating factors: typing, yard work Relieving factors: rest  PRECAUTIONS: None  WEIGHT BEARING RESTRICTIONS: No  FALLS:  Has patient fallen in last 6 months? No  LIVING ENVIRONMENT: Lives with: lives with their spouse Lives in: House/apartment Stairs: Yes: Internal: 8 steps; can reach both and External: 0 steps; none Has following equipment at home: None  OCCUPATION: Works at a computer all day, from home   PLOF: Independent  PATIENT GOALS: Manage this pain and relieve Sx  NEXT MD VISIT: unknown  OBJECTIVE:   DIAGNOSTIC FINDINGS:  na  PATIENT SURVEYS :  Quick DASH: 38.6%  COGNITION: Overall cognitive status: Within functional limits for tasks assessed     SENSATION: Light touch: WFL  POSTURE: Elevated L shoulder and upwardly rotated L scapula  UPPER EXTREMITY ROM:   AAROM ROM Right eval Left eval  Shoulder flexion All wfl 155  Shoulder extension  Wfl  Shoulder abduction  155  Shoulder adduction  wfl  Shoulder internal rotation  wnl  Shoulder external rotation  wnl  Elbow flexion  wnl  Elbow extension  wnl  Wrist flexion  75  Wrist extension  wnl  Wrist ulnar deviation  nt  Wrist radial deviation  nt  Wrist pronation  wnl  Wrist supination  wnl    UPPER EXTREMITY MMT:  MMT Right eval Left eval  Shoulder flexion  3+  Shoulder extension  3+   Shoulder abduction  3+  Shoulder adduction  nt  Shoulder internal rotation  wnl  Shoulder external rotation  wnl  Middle trapezius  3  Lower trapezius  3  Elbow flexion  wnl  Elbow extension  wnl  Wrist flexion  wnl  Wrist extension  4  Wrist ulnar deviation  nt  Wrist radial deviation  nt  Wrist pronation  nt  Wrist supination  nt  Grip strength (lbs) wfl wfl    SHOULDER SPECIAL TESTS: Impingement tests: Neer impingement test: positive  Instability tests: Load and shift test: positive , Jobes relocation test: positive , Apprehension test: positive , and Sulcus sign: negative Rotator cuff assessment: External rotation lag sign: negative, Belly press test: negative, and Infraspinatus test: negative Biceps assessment: Yergason's test: negative and Speed's test: negative  JOINT MOBILITY TESTING:  No excessive jt mobilty noted, some posterior capsular restrictoin noted  PALPATION:  Very tender middle traps, T 4-6 spinous processes and L transverse processes, teres minor, axilla, serratus ant.L dorsal wrist mild tenderness   TODAY'S TREATMENT:                                                                                                                                         DATE: 08/21/22 Wall angels x 10 Standing open book x 10  bil Supine I,T,Y on foam roll 2x10 Supine backstroke x 10 foam roll Standing B ER x 10 RTB Standing horizontal ABD RTB x 10   IASTM to thoracic paraspinals and periscapular muscles  with foam roll  Reviewed body mechanics when lifting baby and driving Keeping posterior shoulder girdle engaged while doing these activities. 08/10/22  Standing ER YTB x 10 against doorframe Standing horiz ABD YTB x 10 against doorframe Standing rows YTB x 10  Standing shoulder extension x 10 YTB Wall angels x 10 Standing thoracic ext at wall x 10  Open books x 10 S/L Thoracic mobilization on foam roll x 10  08/08/22: Kinesiotaping, 3- I pieces designed to assist  with proprioceptive input and motor control/stability posterior shoulder musculature, and to assist with centralization of L humeral head. Instructed in strengthening /muscle re education for middle traps and shoulder ER's with yellow t band.  Verbal, tactile cues for avoiding shrugging L shoulder  PATIENT EDUCATION: Education details: POC, goals, expectations for recovery Person educated: Patient Education method: Explanation, Demonstration, Tactile cues, Verbal cues, and Handouts Education comprehension: verbalized understanding, returned demonstration, verbal cues required, tactile cues required, and needs further education  HOME EXERCISE PROGRAM: Access Code: [email or web address] URL: [email or web address] Date: 08/10/2022 Prepared by: Verta Ellen  Exercises - Shoulder External Rotation and Scapular Retraction with Resistance  - 3 x daily - 7 x weekly - 1 sets - 10 reps - Scapular Retraction with Resistance  - 3 x daily - 7 x weekly - 1 sets - 10 reps - Standing Shoulder Horizontal Abduction with Resistance  - 1 x daily - 7 x weekly - 2 sets - 10 reps - Thoracic Extension Mobilization on Foam Roll  - 1 x daily - 7 x weekly - 2 sets - 10 reps - Sidelying Thoracic Rotation with Open Book  - 1 x daily - 7 x weekly - 2 sets - 10 reps - Wall Angels  - 1 x daily - 7 x weekly - 2 sets - 10 reps  ASSESSMENT:  CLINICAL IMPRESSION: Continued progressing periscap strength and thoracic mobility. Pt reports overall improved pain but difficulty with holding her baby and driving. Based on observation she goes into a faulty posture with these positions so we reviewed body mechanics during these functional activities to emphasize posterior shoulder engagement. She showed good response to all interventions with no increased pain.    OBJECTIVE IMPAIRMENTS: decreased activity tolerance, decreased endurance, decreased ROM, decreased strength, impaired flexibility, impaired UE functional use, and  pain.   ACTIVITY LIMITATIONS: lifting, sleeping, bathing, dressing, reach over head, and caring for others  PARTICIPATION LIMITATIONS: community activity, occupation, and yard work  PERSONAL FACTORS: Time since onset of injury/illness/exacerbation are also affecting patient's functional outcome.   REHAB POTENTIAL: Good  CLINICAL DECISION MAKING: Stable/uncomplicated  EVALUATION COMPLEXITY: Low  GOALS: Goals reviewed with patient? Yes  SHORT TERM GOALS: Target date: 2 weeks 08/22/22  I HEP Baseline:established today Goal status: MET- 08/21/22   LONG TERM GOALS: Target date: 10/03/22  Improve quick DASH score from  Baseline: 38.6% Goal status: INITIAL  2.  Improve strength L shoulder flex, abd, ext and middle traps, lower traps to wnl for stability /endurance for L UE use Baseline: 3/5 to 3+/5 Goal status: INITIAL  3.  Improve flexion ROM L wrist from 75 degrees to 90 degrees Baseline:  Goal status: INITIAL  4.  Full, pain free L shoulder elevation without L wrist pain Baseline:  Goal status: INITIAL  PLAN: PT FREQUENCY:  1-2x/week  PT DURATION: 8 weeks  PLANNED INTERVENTIONS: Therapeutic exercises, Therapeutic activity, Neuromuscular re-education, Balance training, Gait training, Patient/Family education, Self Care, and Joint mobilization  PLAN FOR NEXT SESSION: Progress thoracic mobility and postural strengthening; Dry needling L middle traps, scapular retractors,   Darleene Cleaver, PTA 08/21/2022, 5:55 PM

## 2022-08-22 ENCOUNTER — Other Ambulatory Visit (HOSPITAL_COMMUNITY): Payer: Self-pay

## 2022-08-22 MED ORDER — CIPROFLOXACIN HCL 500 MG PO TABS
500.0000 mg | ORAL_TABLET | Freq: Two times a day (BID) | ORAL | 1 refills | Status: DC
  Filled 2022-08-22: qty 20, 10d supply, fill #0

## 2022-08-22 MED ORDER — METRONIDAZOLE 500 MG PO TABS
500.0000 mg | ORAL_TABLET | Freq: Two times a day (BID) | ORAL | 1 refills | Status: DC
  Filled 2022-08-22: qty 20, 10d supply, fill #0

## 2022-08-23 ENCOUNTER — Ambulatory Visit: Payer: 59

## 2022-08-23 DIAGNOSIS — M778 Other enthesopathies, not elsewhere classified: Secondary | ICD-10-CM | POA: Diagnosis not present

## 2022-08-23 DIAGNOSIS — M7542 Impingement syndrome of left shoulder: Secondary | ICD-10-CM | POA: Diagnosis not present

## 2022-08-23 DIAGNOSIS — M25532 Pain in left wrist: Secondary | ICD-10-CM | POA: Diagnosis not present

## 2022-08-23 DIAGNOSIS — R29898 Other symptoms and signs involving the musculoskeletal system: Secondary | ICD-10-CM

## 2022-08-23 DIAGNOSIS — M67432 Ganglion, left wrist: Secondary | ICD-10-CM | POA: Diagnosis not present

## 2022-08-23 DIAGNOSIS — S52502S Unspecified fracture of the lower end of left radius, sequela: Secondary | ICD-10-CM | POA: Diagnosis not present

## 2022-08-23 NOTE — Therapy (Signed)
OUTPATIENT PHYSICAL THERAPY TREATMENT   Patient Name: Valerie Graham MRN: [id number] DOB:June 17, 1990, 32 y.o., female Today's Date: 08/23/2022  END OF SESSION:  PT End of Session - 08/23/22 1619     Visit Number 4    Date for PT Re-Evaluation 10/03/22    Progress Note Due on Visit 10    PT Start Time 1615    PT Stop Time 1702    PT Time Calculation (min) 47 min    Activity Tolerance Patient tolerated treatment well    Behavior During Therapy [facility] for tasks assessed/performed               Past Medical History:  Diagnosis Date   Anxiety    Chicken pox    Depression    Family history of ovarian cancer    Family history of prostate cancer    Family history of skin cancer    Herpes simplex without mention of complication 07/02/11   Past Surgical History:  Procedure Laterality Date   CESAREAN SECTION N/A 07/19/2021   Procedure: CESAREAN SECTION;  Surgeon: Edwinna Areola, DO;  Location: [facility] LD ORS;  Service: Obstetrics;  Laterality: N/A;   TONSILLECTOMY  2014   WISDOM TOOTH EXTRACTION     Patient Active Problem List   Diagnosis Date Noted   Ganglion cyst of dorsum of left wrist 07/11/2022   Closed fracture of left distal radius 07/11/2022   Tendinitis of extensor tendon of left hand 07/11/2022   S/P cesarean section 07/19/2021   GAD (generalized anxiety disorder) 12/12/2019   Family history of prostate cancer    Family history of ovarian cancer    Family history of skin cancer     PCP: Hinton Dyer, NP  REFERRING PROVIDER: Herbie Drape, MD  REFERRING DIAG: L wrist pain  THERAPY DIAG:  Pain in left wrist  Weakness of left shoulder  Rationale for Evaluation and Treatment: Rehabilitation  ONSET DATE: 6 months  SUBJECTIVE:                                                                                                                                                                                      SUBJECTIVE STATEMENT: Numbness today along L  scapula. Hand dominance: Right  PERTINENT HISTORY: Larey Seat down steps 2 years ago, caught herself with L hand behind her and fractured L radial head.  Casted.  6 months ago started having increased L dorsal wrist pain and L arm "heaviness" pain running from wrist to shoulder. Developed ganglion cyst L dorsal wrist as well.  Referred to PT for eval and Rx .  PAIN:  Are you having pain? Yes: NPRS scale: 0/10  Pain location: L dorsal wrist, L superior/anterior shoulder, L medial scapular border Pain description: ebbs and flows, worse with activity Aggravating factors: typing, yard work Relieving factors: rest  PRECAUTIONS: None  WEIGHT BEARING RESTRICTIONS: No  FALLS:  Has patient fallen in last 6 months? No  LIVING ENVIRONMENT: Lives with: lives with their spouse Lives in: House/apartment Stairs: Yes: Internal: 8 steps; can reach both and External: 0 steps; none Has following equipment at home: None  OCCUPATION: Works at a computer all day, from home   PLOF: Independent  PATIENT GOALS: Manage this pain and relieve Sx  NEXT MD VISIT: unknown  OBJECTIVE:   DIAGNOSTIC FINDINGS:  na  PATIENT SURVEYS :  Quick DASH: 38.6%  COGNITION: Overall cognitive status: Within functional limits for tasks assessed     SENSATION: Light touch: WFL  POSTURE: Elevated L shoulder and upwardly rotated L scapula  UPPER EXTREMITY ROM:   AAROM ROM Right eval Left eval  Shoulder flexion All wfl 155  Shoulder extension  Wfl  Shoulder abduction  155  Shoulder adduction  wfl  Shoulder internal rotation  wnl  Shoulder external rotation  wnl  Elbow flexion  wnl  Elbow extension  wnl  Wrist flexion  75  Wrist extension  wnl  Wrist ulnar deviation  nt  Wrist radial deviation  nt  Wrist pronation  wnl  Wrist supination  wnl    UPPER EXTREMITY MMT:  MMT Right eval Left eval  Shoulder flexion  3+  Shoulder extension  3+  Shoulder abduction  3+  Shoulder adduction  nt  Shoulder  internal rotation  wnl  Shoulder external rotation  wnl  Middle trapezius  3  Lower trapezius  3  Elbow flexion  wnl  Elbow extension  wnl  Wrist flexion  wnl  Wrist extension  4  Wrist ulnar deviation  nt  Wrist radial deviation  nt  Wrist pronation  nt  Wrist supination  nt  Grip strength (lbs) wfl wfl    SHOULDER SPECIAL TESTS: Impingement tests: Neer impingement test: positive  Instability tests: Load and shift test: positive , Jobes relocation test: positive , Apprehension test: positive , and Sulcus sign: negative Rotator cuff assessment: External rotation lag sign: negative, Belly press test: negative, and Infraspinatus test: negative Biceps assessment: Yergason's test: negative and Speed's test: negative  JOINT MOBILITY TESTING:  No excessive jt mobilty noted, some posterior capsular restrictoin noted  PALPATION:  Very tender middle traps, T 4-6 spinous processes and L transverse processes, teres minor, axilla, serratus ant.L dorsal wrist mild tenderness   TODAY'S TREATMENT:                                                                                                                                         DATE: 08/23/22 Therapeutic Exercise: to improve strength and mobility.  Demo, verbal and tactile cues throughout for technique.  UBE L2.0  3 min fwd/ 3 min back Rhomboid stretch 2x30 sec Supine backstroke x 10 foam roll Supine wall angels on foam roll x 10  Supine horiz ABD on foam roll x 10; 10 additional reps with GTB Supine B ER with GTB x 10 on foam roll    Manual Therapy: to decrease muscle spasm, pain and improve mobility.  STM to medial scapular border, rhomboids, lats  08/21/22 Wall angels x 10 Standing open book x 10 bil Supine I,T,Y on foam roll 2x10 Supine backstroke x 10 foam roll Standing B ER x 10 RTB Standing horizontal ABD RTB x 10   IASTM to thoracic paraspinals and periscapular muscles  with foam roll  Reviewed body mechanics when lifting  baby and driving Keeping posterior shoulder girdle engaged while doing these activities. 08/10/22  Standing ER YTB x 10 against doorframe Standing horiz ABD YTB x 10 against doorframe Standing rows YTB x 10  Standing shoulder extension x 10 YTB Wall angels x 10 Standing thoracic ext at wall x 10  Open books x 10 S/L Thoracic mobilization on foam roll x 10  08/08/22: Kinesiotaping, 3- I pieces designed to assist with proprioceptive input and motor control/stability posterior shoulder musculature, and to assist with centralization of L humeral head. Instructed in strengthening /muscle re education for middle traps and shoulder ER's with yellow t band.  Verbal, tactile cues for avoiding shrugging L shoulder  PATIENT EDUCATION: Education details: POC, goals, expectations for recovery Person educated: Patient Education method: Explanation, Demonstration, Tactile cues, Verbal cues, and Handouts Education comprehension: verbalized understanding, returned demonstration, verbal cues required, tactile cues required, and needs further education  HOME EXERCISE PROGRAM: Access Code: [email or web address] URL: [email or web address] Date: 08/23/2022 Prepared by: Verta Ellen  Exercises - Shoulder External Rotation and Scapular Retraction with Resistance  - 3 x daily - 7 x weekly - 1 sets - 10 reps - Scapular Retraction with Resistance  - 3 x daily - 7 x weekly - 1 sets - 10 reps - Standing Shoulder Horizontal Abduction with Resistance  - 1 x daily - 7 x weekly - 2 sets - 10 reps - Thoracic Extension Mobilization on Foam Roll  - 1 x daily - 7 x weekly - 2 sets - 10 reps - Sidelying Thoracic Rotation with Open Book  - 1 x daily - 7 x weekly - 2 sets - 10 reps - Wall Angels  - 1 x daily - 7 x weekly - 2 sets - 10 reps - Supine Chest Stretch on Foam Roll  - 1 x daily - 7 x weekly - 2 sets - 10 reps - Snow Angels on Foam Roll  - 1 x daily - 7 x weekly - 2 sets - 10 reps  ASSESSMENT:  CLINICAL  IMPRESSION: Continued progressing periscap strength and thoracic mobility. Pt came in with numbness along the L scapula that has been going on all day. The numbness improved with manual therapy and exercises. Educated her to try self STM with tennis ball/lacrosse ball and doing thoracic exercises when she feels numbness to ease the symptoms.    OBJECTIVE IMPAIRMENTS: decreased activity tolerance, decreased endurance, decreased ROM, decreased strength, impaired flexibility, impaired UE functional use, and pain.   ACTIVITY LIMITATIONS: lifting, sleeping, bathing, dressing, reach over head, and caring for others  PARTICIPATION LIMITATIONS: community activity, occupation, and yard work  PERSONAL FACTORS: Time since onset of injury/illness/exacerbation are also affecting patient's functional outcome.   REHAB POTENTIAL: Good  CLINICAL DECISION MAKING: Stable/uncomplicated  EVALUATION  COMPLEXITY: Low  GOALS: Goals reviewed with patient? Yes  SHORT TERM GOALS: Target date: 2 weeks 08/22/22  I HEP Baseline:established today Goal status: MET- 08/21/22   LONG TERM GOALS: Target date: 10/03/22  Improve quick DASH score from  Baseline: 38.6% Goal status: INITIAL  2.  Improve strength L shoulder flex, abd, ext and middle traps, lower traps to wnl for stability /endurance for L UE use Baseline: 3/5 to 3+/5 Goal status: INITIAL  3.  Improve flexion ROM L wrist from 75 degrees to 90 degrees Baseline:  Goal status: INITIAL  4.  Full, pain free L shoulder elevation without L wrist pain Baseline:  Goal status: INITIAL  PLAN: PT FREQUENCY: 1-2x/week  PT DURATION: 8 weeks  PLANNED INTERVENTIONS: Therapeutic exercises, Therapeutic activity, Neuromuscular re-education, Balance training, Gait training, Patient/Family education, Self Care, and Joint mobilization  PLAN FOR NEXT SESSION: Progress thoracic mobility and postural strengthening; Dry needling L middle traps, scapular  retractors,   Darleene Cleaver, PTA 08/23/2022, 5:07 PM

## 2022-09-04 ENCOUNTER — Other Ambulatory Visit (HOSPITAL_COMMUNITY): Payer: Self-pay

## 2022-09-04 MED ORDER — NORETHINDRONE ACETATE 5 MG PO TABS
5.0000 mg | ORAL_TABLET | Freq: Every morning | ORAL | 0 refills | Status: DC
  Filled 2022-09-04: qty 10, 10d supply, fill #0

## 2022-09-05 ENCOUNTER — Ambulatory Visit: Payer: 59 | Attending: Family Medicine

## 2022-09-05 ENCOUNTER — Other Ambulatory Visit: Payer: Self-pay

## 2022-09-05 DIAGNOSIS — M7542 Impingement syndrome of left shoulder: Secondary | ICD-10-CM | POA: Diagnosis not present

## 2022-09-05 DIAGNOSIS — R29898 Other symptoms and signs involving the musculoskeletal system: Secondary | ICD-10-CM

## 2022-09-05 DIAGNOSIS — M25532 Pain in left wrist: Secondary | ICD-10-CM | POA: Diagnosis not present

## 2022-09-05 NOTE — Therapy (Signed)
OUTPATIENT PHYSICAL THERAPY TREATMENT   Patient Name: Valerie Graham MRN: [id number] DOB:30-Oct-1990, 32 y.o., female Today's Date: 09/05/2022  END OF SESSION:  PT End of Session - 09/05/22 1615     Visit Number 5    Date for PT Re-Evaluation 10/03/22    Progress Note Due on Visit 10    PT Start Time 1615    PT Stop Time 1700    PT Time Calculation (min) 45 min    Activity Tolerance Patient tolerated treatment well    Behavior During Therapy [facility] for tasks assessed/performed                Past Medical History:  Diagnosis Date   Anxiety    Chicken pox    Depression    Family history of ovarian cancer    Family history of prostate cancer    Family history of skin cancer    Herpes simplex without mention of complication 07/02/11   Past Surgical History:  Procedure Laterality Date   CESAREAN SECTION N/A 07/19/2021   Procedure: CESAREAN SECTION;  Surgeon: Edwinna Areola, DO;  Location: [facility] LD ORS;  Service: Obstetrics;  Laterality: N/A;   TONSILLECTOMY  2014   WISDOM TOOTH EXTRACTION     Patient Active Problem List   Diagnosis Date Noted   Ganglion cyst of dorsum of left wrist 07/11/2022   Closed fracture of left distal radius 07/11/2022   Tendinitis of extensor tendon of left hand 07/11/2022   S/P cesarean section 07/19/2021   GAD (generalized anxiety disorder) 12/12/2019   Family history of prostate cancer    Family history of ovarian cancer    Family history of skin cancer     PCP: Hinton Dyer, NP  REFERRING PROVIDER: Herbie Drape, MD  REFERRING DIAG: L wrist pain  THERAPY DIAG:  Pain in left wrist  Weakness of left shoulder  Impingement syndrome of left shoulder  Rationale for Evaluation and Treatment: Rehabilitation  ONSET DATE: 6 months  SUBJECTIVE:                                                                                                                                                                                       SUBJECTIVE STATEMENT:feel a lot better, no pain L wrist and shoulder now.  Still get numbness occasionally Hand dominance: Right  PERTINENT HISTORY: Larey Seat down steps 2 years ago, caught herself with L hand behind her and fractured L radial head.  Casted.  6 months ago started having increased L dorsal wrist pain and L arm "heaviness" pain running from wrist to shoulder. Developed ganglion cyst L dorsal wrist as well.  Referred to  PT for eval and Rx .  PAIN:  Are you having pain? Yes: NPRS scale: 0/10 Pain location: L dorsal wrist, L superior/anterior shoulder, L medial scapular border Pain description: ebbs and flows, worse with activity Aggravating factors: typing, yard work Relieving factors: rest  PRECAUTIONS: None  WEIGHT BEARING RESTRICTIONS: No  FALLS:  Has patient fallen in last 6 months? No  LIVING ENVIRONMENT: Lives with: lives with their spouse Lives in: House/apartment Stairs: Yes: Internal: 8 steps; can reach both and External: 0 steps; none Has following equipment at home: None  OCCUPATION: Works at a computer all day, from home   PLOF: Independent  PATIENT GOALS: Manage this pain and relieve Sx  NEXT MD VISIT: unknown  OBJECTIVE:   DIAGNOSTIC FINDINGS:  na  PATIENT SURVEYS :  Quick DASH: 38.6%  COGNITION: Overall cognitive status: Within functional limits for tasks assessed     SENSATION: Light touch: WFL  POSTURE: Elevated L shoulder and upwardly rotated L scapula  UPPER EXTREMITY ROM:   AAROM ROM Right eval Left eval  Shoulder flexion All wfl 155  Shoulder extension  Wfl  Shoulder abduction  155  Shoulder adduction  wfl  Shoulder internal rotation  wnl  Shoulder external rotation  wnl  Elbow flexion  wnl  Elbow extension  wnl  Wrist flexion  75  Wrist extension  wnl  Wrist ulnar deviation  nt  Wrist radial deviation  nt  Wrist pronation  wnl  Wrist supination  wnl    UPPER EXTREMITY MMT:  MMT Right eval Left eval L  5/7   Shoulder flexion  3+ 5  Shoulder extension  3+ 5  Shoulder abduction  3+ 5  Shoulder adduction  nt   Shoulder internal rotation  wnl 5  Shoulder external rotation  wnl 5  Middle trapezius  3 4  Lower trapezius  3 3  Elbow flexion  wnl   Elbow extension  wnl   Wrist flexion  wnl   Wrist extension  4   Wrist ulnar deviation  nt   Wrist radial deviation  nt   Wrist pronation  nt   Wrist supination  nt   Grip strength (lbs) wfl wfl     SHOULDER SPECIAL TESTS: Impingement tests: Neer impingement test: positive  Instability tests: Load and shift test: positive , Jobes relocation test: positive , Apprehension test: positive , and Sulcus sign: negative Rotator cuff assessment: External rotation lag sign: negative, Belly press test: negative, and Infraspinatus test: negative Biceps assessment: Yergason's test: negative and Speed's test: negative  JOINT MOBILITY TESTING:  No excessive jt mobilty noted, some posterior capsular restrictoin noted  PALPATION:  Very tender middle traps, T 4-6 spinous processes and L transverse processes, teres minor, axilla, serratus ant.L dorsal wrist mild tenderness   TODAY'S TREATMENT:  DATE: 09/05/22: Manual therapy: Positioned in prone for PA mobilization of T 5,6,7 to improve segmental spinal mobility. Cross friction massage L thoracic spine paraspinals and middle traps PA/ glide B post mid and lower ribs  Therapeutic exercise: Reassessed L shoulder strength, see flow chart above Refined, reduced home program to focus on remaining weak musculature , middle , lower traps, L supraspinatus. See updated HEP below Provided with written instructions  08/23/22 Therapeutic Exercise: to improve strength and mobility.  Demo, verbal and tactile cues throughout for technique.  UBE L2.0 3 min fwd/ 3 min back Rhomboid stretch  2x30 sec Supine backstroke x 10 foam roll Supine wall angels on foam roll x 10  Supine horiz ABD on foam roll x 10; 10 additional reps with GTB Supine B ER with GTB x 10 on foam roll    Manual Therapy: to decrease muscle spasm, pain and improve mobility.  STM to medial scapular border, rhomboids, lats  08/21/22 Wall angels x 10 Standing open book x 10 bil Supine I,T,Y on foam roll 2x10 Supine backstroke x 10 foam roll Standing B ER x 10 RTB Standing horizontal ABD RTB x 10   IASTM to thoracic paraspinals and periscapular muscles  with foam roll  Reviewed body mechanics when lifting baby and driving Keeping posterior shoulder girdle engaged while doing these activities. 08/10/22  Standing ER YTB x 10 against doorframe Standing horiz ABD YTB x 10 against doorframe Standing rows YTB x 10  Standing shoulder extension x 10 YTB Wall angels x 10 Standing thoracic ext at wall x 10  Open books x 10 S/L Thoracic mobilization on foam roll x 10  08/08/22: Kinesiotaping, 3- I pieces designed to assist with proprioceptive input and motor control/stability posterior shoulder musculature, and to assist with centralization of L humeral head. Instructed in strengthening /muscle re education for middle traps and shoulder ER's with yellow t band.  Verbal, tactile cues for avoiding shrugging L shoulder  PATIENT EDUCATION: Education details: POC, goals, expectations for recovery Person educated: Patient Education method: Explanation, Demonstration, Tactile cues, Verbal cues, and Handouts Education comprehension: verbalized understanding, returned demonstration, verbal cues required, tactile cues required, and needs further education  HOME EXERCISE PROGRAM: Access Code: [email or web address] URL: [email or web address] Date: 09/05/2022 Prepared by: Caralee Ates  Program Notes exercise 1 blue band2 use 2# weight3 red band  Exercises - Standing Single Arm Shoulder PNF D1 Extension with Anchored  Resistance  - 1 x daily - 7 x weekly - 3 sets - 10 reps - Single Arm Scaption with Dumbbell  - 1 x daily - 7 x weekly - 3 sets - 10 reps - Standing Shoulder Horizontal Abduction with Resistance  - 1 x daily - 7 x weekly - 3 sets - 10 reps Access Code: [email or web address] URL: [email or web address] Date: 08/23/2022 Prepared by: Verta Ellen  Exercises - Shoulder External Rotation and Scapular Retraction with Resistance  - 3 x daily - 7 x weekly - 1 sets - 10 reps - Scapular Retraction with Resistance  - 3 x daily - 7 x weekly - 1 sets - 10 reps - Standing Shoulder Horizontal Abduction with Resistance  - 1 x daily - 7 x weekly - 2 sets - 10 reps - Thoracic Extension Mobilization on Foam Roll  - 1 x daily - 7 x weekly - 2 sets - 10 reps - Sidelying Thoracic Rotation with Open Book  - 1 x daily - 7 x weekly - 2 sets - 10 reps - Wall Angels  -  1 x daily - 7 x weekly - 2 sets - 10 reps - Supine Chest Stretch on Foam Roll  - 1 x daily - 7 x weekly - 2 sets - 10 reps - Snow Angels on Foam Roll  - 1 x daily - 7 x weekly - 2 sets - 10 reps  ASSESSMENT:  CLINICAL IMPRESSION: Continued improvement noted L shoulder, wrist pain.  Reassessed patient's movement patterns l shoulder and strength.  Marked improvement overall L shoulder strength.  Condensed home program today.  Tolerated everything well.  OBJECTIVE IMPAIRMENTS: decreased activity tolerance, decreased endurance, decreased ROM, decreased strength, impaired flexibility, impaired UE functional use, and pain.   ACTIVITY LIMITATIONS: lifting, sleeping, bathing, dressing, reach over head, and caring for others  PARTICIPATION LIMITATIONS: community activity, occupation, and yard work  PERSONAL FACTORS: Time since onset of injury/illness/exacerbation are also affecting patient's functional outcome.   REHAB POTENTIAL: Good  CLINICAL DECISION MAKING: Stable/uncomplicated  EVALUATION COMPLEXITY: Low  GOALS: Goals reviewed with patient?  Yes  SHORT TERM GOALS: Target date: 2 weeks 08/22/22  I HEP Baseline:established today Goal status: MET- 08/21/22   LONG TERM GOALS: Target date: 10/03/22  Improve quick DASH score from  Baseline: 38.6% Goal status: IN PROGRESS  2.  Improve strength L shoulder flex, abd, ext and middle traps, lower traps to wnl for stability /endurance for L UE use Baseline: 3/5 to 3+/5 Goal status: IN PROGRESS  see flow chart, L shoulder strength now 5. L middle traps, lower traps, supraspinatus all 3 to 3+  3.  Improve flexion ROM L wrist from 75 degrees to 90 degrees Baseline:  Goal status: IN PROGRESS still restricted  4.  Full, pain free L shoulder elevation without L wrist pain Baseline:  Goal status: MET  PLAN: PT FREQUENCY: 1-2x/week  PT DURATION: 8 weeks  PLANNED INTERVENTIONS: Therapeutic exercises, Therapeutic activity, Neuromuscular re-education, Balance training, Gait training, Patient/Family education, Self Care, and Joint mobilization  PLAN FOR NEXT SESSION: Progress thoracic mobility and postural strengthening; Dry needling L middle traps, scapular retractors,   Adalai Perl L Coraima Tibbs, PT 09/05/2022, 5:05 PM

## 2022-09-08 ENCOUNTER — Other Ambulatory Visit (HOSPITAL_COMMUNITY): Payer: Self-pay

## 2022-09-08 DIAGNOSIS — N711 Chronic inflammatory disease of uterus: Secondary | ICD-10-CM | POA: Diagnosis not present

## 2022-09-08 DIAGNOSIS — Z3141 Encounter for fertility testing: Secondary | ICD-10-CM | POA: Diagnosis not present

## 2022-09-08 MED ORDER — DOXYCYCLINE HYCLATE 100 MG PO CAPS
100.0000 mg | ORAL_CAPSULE | Freq: Two times a day (BID) | ORAL | 2 refills | Status: DC
  Filled 2022-09-08: qty 10, 5d supply, fill #0

## 2022-09-19 ENCOUNTER — Other Ambulatory Visit: Payer: Self-pay

## 2022-09-19 ENCOUNTER — Ambulatory Visit: Payer: 59

## 2022-09-19 DIAGNOSIS — M25532 Pain in left wrist: Secondary | ICD-10-CM | POA: Diagnosis not present

## 2022-09-19 DIAGNOSIS — R29898 Other symptoms and signs involving the musculoskeletal system: Secondary | ICD-10-CM | POA: Diagnosis not present

## 2022-09-19 DIAGNOSIS — M7542 Impingement syndrome of left shoulder: Secondary | ICD-10-CM | POA: Diagnosis not present

## 2022-09-19 NOTE — Therapy (Signed)
OUTPATIENT PHYSICAL THERAPY DISCHARGE SUMMARY:  Progress Note Reporting Period 08/08/22 to 09/19/22  See note below for Objective Data and Assessment of Progress/Goals.     Patient Name: Shawndra Slayback MRN: [id number] DOB:Nov 06, 1990, 32 y.o., female Today's Date: 09/19/2022  END OF SESSION:  PT End of Session - 09/19/22 1618     Visit Number 6    Date for PT Re-Evaluation 10/03/22    Progress Note Due on Visit 10    PT Start Time 1615    PT Stop Time 1637    PT Time Calculation (min) 22 min    Activity Tolerance Patient tolerated treatment well    Behavior During Therapy [facility] for tasks assessed/performed                Past Medical History:  Diagnosis Date   Anxiety    Chicken pox    Depression    Family history of ovarian cancer    Family history of prostate cancer    Family history of skin cancer    Herpes simplex without mention of complication 07/02/11   Past Surgical History:  Procedure Laterality Date   CESAREAN SECTION N/A 07/19/2021   Procedure: CESAREAN SECTION;  Surgeon: Edwinna Areola, DO;  Location: [facility] LD ORS;  Service: Obstetrics;  Laterality: N/A;   TONSILLECTOMY  2014   WISDOM TOOTH EXTRACTION     Patient Active Problem List   Diagnosis Date Noted   Ganglion cyst of dorsum of left wrist 07/11/2022   Closed fracture of left distal radius 07/11/2022   Tendinitis of extensor tendon of left hand 07/11/2022   S/P cesarean section 07/19/2021   GAD (generalized anxiety disorder) 12/12/2019   Family history of prostate cancer    Family history of ovarian cancer    Family history of skin cancer     PCP: Hinton Dyer, NP  REFERRING PROVIDER: Herbie Drape, MD  REFERRING DIAG: L wrist pain  THERAPY DIAG:  Weakness of left shoulder  Impingement syndrome of left shoulder  Pain in left wrist  Rationale for Evaluation and Treatment: Rehabilitation  ONSET DATE: 6 months  SUBJECTIVE:                                                                                                                                                                                       SUBJECTIVE STATEMENT: Feel a lot better, gets numbness L medial upper back, scapular retractors a couple of times a week, able to resolve with stretches, position changes.    Hand dominance: Right  PERTINENT HISTORY: Larey Seat down steps 2 years ago, caught herself with L hand behind her and fractured L radial  head.  Casted.  6 months ago started having increased L dorsal wrist pain and L arm "heaviness" pain running from wrist to shoulder. Developed ganglion cyst L dorsal wrist as well.  Referred to PT for eval and Rx .  PAIN:  Are you having pain? Yes: NPRS scale: 0/10 Pain location: L dorsal wrist, L superior/anterior shoulder, L medial scapular border Pain description: ebbs and flows, worse with activity Aggravating factors: typing, yard work Relieving factors: rest  PRECAUTIONS: None  WEIGHT BEARING RESTRICTIONS: No  FALLS:  Has patient fallen in last 6 months? No  LIVING ENVIRONMENT: Lives with: lives with their spouse Lives in: House/apartment Stairs: Yes: Internal: 8 steps; can reach both and External: 0 steps; none Has following equipment at home: None  OCCUPATION: Works at a computer all day, from home   PLOF: Independent  PATIENT GOALS: Manage this pain and relieve Sx  NEXT MD VISIT: unknown  OBJECTIVE:   DIAGNOSTIC FINDINGS:  na  PATIENT SURVEYS :  Quick DASH: 38.6%  COGNITION: Overall cognitive status: Within functional limits for tasks assessed     SENSATION: Light touch: WFL  POSTURE: Elevated L shoulder and upwardly rotated L scapula  UPPER EXTREMITY ROM:   AAROM ROM Right eval Left eval  Shoulder flexion All wfl 155  Shoulder extension  Wfl  Shoulder abduction  155  Shoulder adduction  wfl  Shoulder internal rotation  wnl  Shoulder external rotation  wnl  Elbow flexion  wnl  Elbow extension  wnl  Wrist flexion   75  Wrist extension  wnl  Wrist ulnar deviation  nt  Wrist radial deviation  nt  Wrist pronation  wnl  Wrist supination  wnl    UPPER EXTREMITY MMT:09/19/22: all MMT wnl B UE's now including lower and middle traps L  MMT Right eval Left eval L  5/7  Shoulder flexion  3+ 5  Shoulder extension  3+ 5  Shoulder abduction  3+ 5  Shoulder adduction  nt   Shoulder internal rotation  wnl 5  Shoulder external rotation  wnl 5  Middle trapezius  3 4  Lower trapezius  3 3  Elbow flexion  wnl   Elbow extension  wnl   Wrist flexion  wnl   Wrist extension  4   Wrist ulnar deviation  nt   Wrist radial deviation  nt   Wrist pronation  nt   Wrist supination  nt   Grip strength (lbs) wfl wfl     SHOULDER SPECIAL TESTS: Impingement tests: Neer impingement test: positive  Instability tests: Load and shift test: positive , Jobes relocation test: positive , Apprehension test: positive , and Sulcus sign: negative Rotator cuff assessment: External rotation lag sign: negative, Belly press test: negative, and Infraspinatus test: negative Biceps assessment: Yergason's test: negative and Speed's test: negative  JOINT MOBILITY TESTING:  No excessive jt mobilty noted, some posterior capsular restrictoin noted  PALPATION:  Very tender middle traps, T 4-6 spinous processes and L transverse processes, teres minor, axilla, serratus ant.L dorsal wrist mild tenderness   TODAY'S TREATMENT:  DATE: 09/19/22:  Patient reassessed today with MMT. L shoulder jt mobility, palpation thoracic region.  No pain with palpation PA glides through T 2 to T 6 region, L and R MMT now equal and symmetrical B shoulders Full AROM B shoulder movement noted. Reviewed home ex routine,she is performing the stretches, standing open book, t band horizontal abd and B shoulder ER when develops pain  and numbness mid thoracic region. Has performed prone L lower traps, Y lifts with 2 # wt at home  a few times a week  09/05/22: Manual therapy: Positioned in prone for PA mobilization of T 5,6,7 to improve segmental spinal mobility. Cross friction massage L thoracic spine paraspinals and middle traps PA/ glide B post mid and lower ribs  Therapeutic exercise: Reassessed L shoulder strength, see flow chart above Refined, reduced home program to focus on remaining weak musculature , middle , lower traps, L supraspinatus. See updated HEP below Provided with written instructions  08/23/22 Therapeutic Exercise: to improve strength and mobility.  Demo, verbal and tactile cues throughout for technique.  UBE L2.0 3 min fwd/ 3 min back Rhomboid stretch 2x30 sec Supine backstroke x 10 foam roll Supine wall angels on foam roll x 10  Supine horiz ABD on foam roll x 10; 10 additional reps with GTB Supine B ER with GTB x 10 on foam roll    Manual Therapy: to decrease muscle spasm, pain and improve mobility.  STM to medial scapular border, rhomboids, lats  08/21/22 Wall angels x 10 Standing open book x 10 bil Supine I,T,Y on foam roll 2x10 Supine backstroke x 10 foam roll Standing B ER x 10 RTB Standing horizontal ABD RTB x 10   IASTM to thoracic paraspinals and periscapular muscles  with foam roll  Reviewed body mechanics when lifting baby and driving Keeping posterior shoulder girdle engaged while doing these activities. 08/10/22  Standing ER YTB x 10 against doorframe Standing horiz ABD YTB x 10 against doorframe Standing rows YTB x 10  Standing shoulder extension x 10 YTB Wall angels x 10 Standing thoracic ext at wall x 10  Open books x 10 S/L Thoracic mobilization on foam roll x 10  08/08/22: Kinesiotaping, 3- I pieces designed to assist with proprioceptive input and motor control/stability posterior shoulder musculature, and to assist with centralization of L humeral head. Instructed  in strengthening /muscle re education for middle traps and shoulder ER's with yellow t band.  Verbal, tactile cues for avoiding shrugging L shoulder  PATIENT EDUCATION: Education details: POC, goals, expectations for recovery Person educated: Patient Education method: Explanation, Demonstration, Tactile cues, Verbal cues, and Handouts Education comprehension: verbalized understanding, returned demonstration, verbal cues required, tactile cues required, and needs further education  HOME EXERCISE PROGRAM: Access Code: [email or web address] URL: [email or web address] Date: 09/05/2022 Prepared by: Caralee Ates  Program Notes exercise 1 blue band2 use 2# weight3 red band  Exercises - Standing Single Arm Shoulder PNF D1 Extension with Anchored Resistance  - 1 x daily - 7 x weekly - 3 sets - 10 reps - Single Arm Scaption with Dumbbell  - 1 x daily - 7 x weekly - 3 sets - 10 reps - Standing Shoulder Horizontal Abduction with Resistance  - 1 x daily - 7 x weekly - 3 sets - 10 reps Access Code: [email or web address] URL: [email or web address] Date: 08/23/2022 Prepared by: Verta Ellen  Exercises - Shoulder External Rotation and Scapular Retraction with Resistance  - 3 x daily - 7 x weekly - 1 sets -  10 reps - Scapular Retraction with Resistance  - 3 x daily - 7 x weekly - 1 sets - 10 reps - Standing Shoulder Horizontal Abduction with Resistance  - 1 x daily - 7 x weekly - 2 sets - 10 reps - Thoracic Extension Mobilization on Foam Roll  - 1 x daily - 7 x weekly - 2 sets - 10 reps - Sidelying Thoracic Rotation with Open Book  - 1 x daily - 7 x weekly - 2 sets - 10 reps - Wall Angels  - 1 x daily - 7 x weekly - 2 sets - 10 reps - Supine Chest Stretch on Foam Roll  - 1 x daily - 7 x weekly - 2 sets - 10 reps - Snow Angels on Foam Roll  - 1 x daily - 7 x weekly - 2 sets - 10 reps  ASSESSMENT:  CLINICAL IMPRESSION: Continued improvement noted L shoulder, wrist pain.  Reassessed patient's  movement patterns l shoulder and strength.  Marked improvement overall L shoulder strength.  Sx occurring only occasionally now.  Comfortable with home program.  All goals met.  DC today   OBJECTIVE IMPAIRMENTS: decreased activity tolerance, decreased endurance, decreased ROM, decreased strength, impaired flexibility, impaired UE functional use, and pain.   ACTIVITY LIMITATIONS: lifting, sleeping, bathing, dressing, reach over head, and caring for others  PARTICIPATION LIMITATIONS: community activity, occupation, and yard work  PERSONAL FACTORS: Time since onset of injury/illness/exacerbation are also affecting patient's functional outcome.   REHAB POTENTIAL: Good  CLINICAL DECISION MAKING: Stable/uncomplicated  EVALUATION COMPLEXITY: Low  GOALS: Goals reviewed with patient? Yes  SHORT TERM GOALS: Target date: 2 weeks 08/22/22  I HEP Baseline:established today Goal status: MET- 08/21/22   LONG TERM GOALS: Target date: 10/03/22  Improve quick DASH score from  Baseline: 38.6% Goal status: IN PROGRESS 09/19/22:  6.8%  2.  Improve strength L shoulder flex, abd, ext and middle traps, lower traps to wnl for stability /endurance for L UE use Baseline: 3/5 to 3+/5 Goal status: IN PROGRESS  see flow chart, L shoulder strength now 5. L middle traps, lower traps, supraspinatus all 3 to 3+ 09/19/22: goal met   3.  Improve flexion ROM L wrist from 75 degrees to 90 degrees Baseline:  Goal status: IN PROGRESS still restricted 09/19/22: still mildly restricted  4.  Full, pain free L shoulder elevation without L wrist pain  Baseline:  Goal status: MET  PLAN: PT FREQUENCY: 1-2x/week  PT DURATION: 8 weeks  PLANNED INTERVENTIONS: Therapeutic exercises, Therapeutic activity, Neuromuscular re-education, Balance training, Gait training, Patient/Family education, Self Care, and Joint mobilization  PLAN FOR NEXT SESSION: Progress thoracic mobility and postural strengthening; Dry needling L middle  traps, scapular retractors,   Arlisa Leclere L Nehemie Casserly, PT 09/19/2022, 5:03 PM

## 2022-09-20 ENCOUNTER — Other Ambulatory Visit (HOSPITAL_COMMUNITY): Payer: Self-pay

## 2022-09-20 DIAGNOSIS — N979 Female infertility, unspecified: Secondary | ICD-10-CM | POA: Diagnosis not present

## 2022-09-20 DIAGNOSIS — Z3183 Encounter for assisted reproductive fertility procedure cycle: Secondary | ICD-10-CM | POA: Diagnosis not present

## 2022-09-20 DIAGNOSIS — Z319 Encounter for procreative management, unspecified: Secondary | ICD-10-CM | POA: Diagnosis not present

## 2022-09-20 MED ORDER — PENTOXIFYLLINE ER 400 MG PO TBCR
400.0000 mg | EXTENDED_RELEASE_TABLET | Freq: Three times a day (TID) | ORAL | 1 refills | Status: DC
  Filled 2022-09-20: qty 30, 10d supply, fill #0

## 2022-10-05 ENCOUNTER — Other Ambulatory Visit (HOSPITAL_COMMUNITY): Payer: Self-pay

## 2022-10-06 ENCOUNTER — Other Ambulatory Visit (HOSPITAL_COMMUNITY): Payer: Self-pay

## 2022-10-06 DIAGNOSIS — Z3200 Encounter for pregnancy test, result unknown: Secondary | ICD-10-CM | POA: Diagnosis not present

## 2022-10-06 DIAGNOSIS — Z113 Encounter for screening for infections with a predominantly sexual mode of transmission: Secondary | ICD-10-CM | POA: Diagnosis not present

## 2022-10-06 DIAGNOSIS — N979 Female infertility, unspecified: Secondary | ICD-10-CM | POA: Diagnosis not present

## 2022-10-06 DIAGNOSIS — Z3183 Encounter for assisted reproductive fertility procedure cycle: Secondary | ICD-10-CM | POA: Diagnosis not present

## 2022-10-06 DIAGNOSIS — Z3201 Encounter for pregnancy test, result positive: Secondary | ICD-10-CM | POA: Diagnosis not present

## 2022-10-06 MED ORDER — ESTRADIOL 0.1 MG/24HR TD PTTW
2.0000 | MEDICATED_PATCH | TRANSDERMAL | 3 refills | Status: DC
  Filled 2022-10-09: qty 24, 36d supply, fill #0

## 2022-10-09 ENCOUNTER — Other Ambulatory Visit (HOSPITAL_COMMUNITY): Payer: Self-pay

## 2022-10-09 DIAGNOSIS — Z3200 Encounter for pregnancy test, result unknown: Secondary | ICD-10-CM | POA: Diagnosis not present

## 2022-10-09 DIAGNOSIS — Z3201 Encounter for pregnancy test, result positive: Secondary | ICD-10-CM | POA: Diagnosis not present

## 2022-10-10 ENCOUNTER — Other Ambulatory Visit (HOSPITAL_COMMUNITY): Payer: Self-pay

## 2022-10-12 ENCOUNTER — Other Ambulatory Visit (HOSPITAL_COMMUNITY): Payer: Self-pay

## 2022-10-20 ENCOUNTER — Other Ambulatory Visit (HOSPITAL_COMMUNITY): Payer: Self-pay

## 2022-10-23 DIAGNOSIS — Z3200 Encounter for pregnancy test, result unknown: Secondary | ICD-10-CM | POA: Diagnosis not present

## 2022-10-25 ENCOUNTER — Other Ambulatory Visit (HOSPITAL_COMMUNITY): Payer: Self-pay

## 2022-11-01 ENCOUNTER — Other Ambulatory Visit (HOSPITAL_COMMUNITY): Payer: Self-pay

## 2022-11-03 ENCOUNTER — Other Ambulatory Visit (HOSPITAL_COMMUNITY): Payer: Self-pay

## 2022-11-03 ENCOUNTER — Other Ambulatory Visit: Payer: Self-pay

## 2022-11-03 DIAGNOSIS — O0900 Supervision of pregnancy with history of infertility, unspecified trimester: Secondary | ICD-10-CM | POA: Diagnosis not present

## 2022-11-03 MED ORDER — PROGESTERONE 200 MG PO CAPS
200.0000 mg | ORAL_CAPSULE | Freq: Every evening | ORAL | 0 refills | Status: DC
  Filled 2022-11-03: qty 14, 14d supply, fill #0

## 2022-11-09 ENCOUNTER — Other Ambulatory Visit (HOSPITAL_COMMUNITY): Payer: Self-pay

## 2022-11-09 MED ORDER — PROGESTERONE 200 MG PO CAPS
200.0000 mg | ORAL_CAPSULE | Freq: Every day | ORAL | 0 refills | Status: DC
  Filled 2022-11-09: qty 14, 14d supply, fill #0

## 2022-11-12 ENCOUNTER — Encounter (HOSPITAL_BASED_OUTPATIENT_CLINIC_OR_DEPARTMENT_OTHER): Payer: Self-pay

## 2022-11-12 ENCOUNTER — Emergency Department (HOSPITAL_BASED_OUTPATIENT_CLINIC_OR_DEPARTMENT_OTHER): Payer: 59

## 2022-11-12 ENCOUNTER — Other Ambulatory Visit: Payer: Self-pay

## 2022-11-12 ENCOUNTER — Emergency Department (HOSPITAL_BASED_OUTPATIENT_CLINIC_OR_DEPARTMENT_OTHER)
Admission: EM | Admit: 2022-11-12 | Discharge: 2022-11-12 | Disposition: A | Discharge status: Home / Self Care | Payer: 59 | Attending: Emergency Medicine | Admitting: Emergency Medicine

## 2022-11-12 DIAGNOSIS — R5383 Other fatigue: Secondary | ICD-10-CM | POA: Insufficient documentation

## 2022-11-12 DIAGNOSIS — M791 Myalgia, unspecified site: Secondary | ICD-10-CM | POA: Insufficient documentation

## 2022-11-12 DIAGNOSIS — R109 Unspecified abdominal pain: Secondary | ICD-10-CM

## 2022-11-12 DIAGNOSIS — R1030 Lower abdominal pain, unspecified: Secondary | ICD-10-CM | POA: Diagnosis not present

## 2022-11-12 DIAGNOSIS — Z3A09 9 weeks gestation of pregnancy: Secondary | ICD-10-CM | POA: Insufficient documentation

## 2022-11-12 DIAGNOSIS — O219 Vomiting of pregnancy, unspecified: Secondary | ICD-10-CM | POA: Insufficient documentation

## 2022-11-12 DIAGNOSIS — O26811 Pregnancy related exhaustion and fatigue, first trimester: Secondary | ICD-10-CM | POA: Diagnosis not present

## 2022-11-12 DIAGNOSIS — O09811 Supervision of pregnancy resulting from assisted reproductive technology, first trimester: Secondary | ICD-10-CM | POA: Diagnosis not present

## 2022-11-12 DIAGNOSIS — Z3A01 Less than 8 weeks gestation of pregnancy: Secondary | ICD-10-CM | POA: Diagnosis not present

## 2022-11-12 DIAGNOSIS — O26891 Other specified pregnancy related conditions, first trimester: Secondary | ICD-10-CM | POA: Insufficient documentation

## 2022-11-12 LAB — COMPREHENSIVE METABOLIC PANEL
ALT: 12 U/L (ref 0–44)
AST: 16 U/L (ref 15–41)
Albumin: 4.2 g/dL (ref 3.5–5.0)
Alkaline Phosphatase: 72 U/L (ref 38–126)
Anion gap: 10 (ref 5–15)
BUN: 9 mg/dL (ref 6–20)
CO2: 22 mmol/L (ref 22–32)
Calcium: 9.4 mg/dL (ref 8.9–10.3)
Chloride: 105 mmol/L (ref 98–111)
Creatinine, Ser: 0.52 mg/dL (ref 0.44–1.00)
GFR, Estimated: >60 mL/min (ref >60)
Glucose, Bld: 102 mg/dL — ABNORMAL HIGH (ref 70–99)
Potassium: 3.8 mmol/L (ref 3.5–5.1)
Sodium: 137 mmol/L (ref 135–145)
Total Bilirubin: 0.2 mg/dL — ABNORMAL LOW (ref 0.3–1.2)
Total Protein: 6.9 g/dL (ref 6.5–8.1)

## 2022-11-12 LAB — CBC
HCT: 33.6 % — ABNORMAL LOW (ref 36.0–46.0)
Hemoglobin: 10.9 g/dL — ABNORMAL LOW (ref 12.0–15.0)
MCH: 26.5 pg (ref 26.0–34.0)
MCHC: 32.4 g/dL (ref 30.0–36.0)
MCV: 81.8 fL (ref 80.0–100.0)
Platelets: 341 10*3/uL (ref 150–400)
RBC: 4.11 MIL/uL (ref 3.87–5.11)
RDW: 14.8 % (ref 11.5–15.5)
WBC: 10.5 10*3/uL (ref 4.0–10.5)
nRBC: 0 % (ref 0.0–0.2)

## 2022-11-12 LAB — URINALYSIS, ROUTINE W REFLEX MICROSCOPIC
Bilirubin Urine: NEGATIVE
Glucose, UA: NEGATIVE mg/dL
Hgb urine dipstick: NEGATIVE
Ketones, ur: NEGATIVE mg/dL
Leukocytes,Ua: NEGATIVE
Nitrite: NEGATIVE
Protein, ur: NEGATIVE mg/dL
Specific Gravity, Urine: 1.024 (ref 1.005–1.030)
pH: 7.5 (ref 5.0–8.0)

## 2022-11-12 LAB — LIPASE, BLOOD: Lipase: 31 U/L (ref 11–51)

## 2022-11-12 LAB — HCG, QUANTITATIVE, PREGNANCY: hCG, Beta Chain, Quant, S: 124450 m[IU]/mL — ABNORMAL HIGH (ref <5)

## 2022-11-12 LAB — HCG, SERUM, QUALITATIVE: Preg, Serum: POSITIVE — AB

## 2022-11-12 MED ORDER — BONJESTA 20-20 MG PO TBCR: EXTENDED_RELEASE_TABLET | ORAL | 0 refills | Status: DC

## 2022-11-12 MED ORDER — SODIUM CHLORIDE 0.9 % IV BOLUS
1000.0000 mL | Freq: Once | INTRAVENOUS | Status: AC
  Administered 2022-11-12: 1000 mL via INTRAVENOUS

## 2022-11-12 MED ORDER — ONDANSETRON HCL 4 MG/2ML IJ SOLN
4.0000 mg | Freq: Once | INTRAMUSCULAR | Status: AC
  Administered 2022-11-12: 4 mg via INTRAVENOUS
  Filled 2022-11-12: qty 2

## 2022-11-12 NOTE — ED Notes (Signed)
Pt reports feeling better after zofran, IVF almost finished,ultrasound in room

## 2022-11-12 NOTE — Discharge Instructions (Addendum)
It was a pleasure taking care of you here in the emergency department  We have started you on a medication for your nausea.  Take as prescribed.  Gradually increase liquids, follow-up with OB/GYN, return for new worsening symptoms

## 2022-11-12 NOTE — ED Notes (Signed)
Dc instructions reviewed with patient. Patient voiced understanding. Dc with belongings.  °

## 2022-11-12 NOTE — ED Triage Notes (Signed)
Patient arrives with complaints of worsening fatigue, body pain abdominal pain. Patient is also [redacted] weeks pregnant. No vaginal bleeding.  Rates pain a 2/10.

## 2022-11-12 NOTE — ED Provider Notes (Signed)
[place] EMERGENCY DEPARTMENT AT [facility] Provider Note   CSN: [id number] Arrival date & time: 11/12/22  1351     History  Chief Complaint  Patient presents with   Abdominal Pain   Pregnant    Valerie Graham is a 32 y.o. female G2 P1 approximately [redacted] weeks pregnant, IVF pregnancy here for evaluation of myalgias, fatigue and nausea.  Has persistent lower abdominal cramping.  She is ready had 2 ultrasounds which establish single IUP.  No vaginal discharge, bleeding.  No vomiting her persistently nauseous.  She has been taking B6.  Was recently released from her IVF clinic, [facility].  Has not followed with her OB/GYN yet to establish care however an appointment next week.  No headache, congestion, rhinorrhea, cough, dysuria, hematuria  HPI     Home Medications Prior to Admission medications   Medication Sig Start Date End Date Taking? Authorizing Provider  Doxylamine-Pyridoxine ER (BONJESTA) 20-20 MG TBCR Take 1 tablet orally at bedtime.  If still having symptoms may increase to 1 tablet in the morning and 1 tablet in the evening, max 2 tablets daily 11/12/22  Yes Asiyah Pineau A, PA-C  albuterol (PROAIR HFA) 108 (90 Base) MCG/ACT inhaler Inhale 2 puffs every 4 hours as directed. Patient not taking: Reported on 07/11/2022 12/30/20     ciprofloxacin (CIPRO) 500 MG tablet Take 1 tablet (500 mg total) by mouth 2 (two) times daily. 08/21/22     doxycycline (VIBRAMYCIN) 100 MG capsule Take 1 capsule (100 mg total) by mouth 2 (two) times daily for 5 days 08/11/22     doxycycline (VIBRAMYCIN) 100 MG capsule Take 1 capsule (100 mg total) by mouth 2 (two) times daily for 5 days. 09/08/22     estradiol (ESTRACE) 2 MG tablet Take 1 tablet (2 mg total) by mouth 2 (two) times daily. 08/02/22     estradiol (VIVELLE-DOT) 0.1 MG/24HR patch Apply 1 patch to skin every 3 days 08/02/22     estradiol (VIVELLE-DOT) 0.1 MG/24HR patch Place 2 patches (0.2 mg total) onto the skin every 3 (three)  days. 10/06/22     methylPREDNISolone (MEDROL) 8 MG tablet Take 1 tablet (8 mg total) by mouth 2 (two) times daily. 08/02/22     metroNIDAZOLE (FLAGYL) 500 MG tablet Take 1 tablet (500 mg total) by mouth 2 (two) times daily. 08/21/22     naproxen (NAPROSYN) 500 MG tablet Take 1 tablet (500 mg total) by mouth 2 (two) times daily with a meal. 07/11/22   Loyola Mast, MD  NEEDLE, DISP, 22 G 22G X 1-1/2" MISC Use to inject progesterone 08/02/22     norethindrone (AYGESTIN) 5 MG tablet Take 1 tablet (5 mg total) by mouth every morning. 09/04/22     pentoxifylline (TRENTAL) 400 MG CR tablet Take 1 tablet (400 mg total) by mouth 3 (three) times daily. 09/20/22     progesterone (PROMETRIUM) 200 MG capsule Take 1 capsule (200 mg total) by mouth at bedtime. 11/03/22     progesterone (PROMETRIUM) 200 MG capsule Take 1 capsule (200 mg) by mouth at bedtime. 11/09/22     progesterone 50 MG/ML injection Inject 1 ML intramuscularly daily 08/02/22     sertraline (ZOLOFT) 50 MG tablet Take 1 tablet (50 mg total) by mouth daily. 01/10/22 01/10/23  Nche, Bonna Gains, NP  SYRINGE-NEEDLE, DISP, 3 ML (B-D 3CC LUER-LOK SYR 18GX1-1/2) 18G X 1-1/2" 3 ML MISC Use to draw out Progesterone 08/02/22         Allergies  Penicillins    Review of Systems   Review of Systems  Constitutional:  Positive for fatigue.  HENT: Negative.    Respiratory: Negative.    Cardiovascular: Negative.   Gastrointestinal:  Positive for abdominal pain and nausea. Negative for abdominal distention, anal bleeding, blood in stool, constipation, diarrhea and vomiting.  Genitourinary: Negative.   Musculoskeletal:  Positive for myalgias.  Skin: Negative.   Neurological: Negative.   All other systems reviewed and are negative.   Physical Exam Updated Vital Signs BP 105/71   Pulse 67   Temp 98.2 F (36.8 C) (Oral)   Resp 18   Ht 5\' 7"  (1.702 m)   Wt 80.2 kg   SpO2 100%   BMI 27.69 kg/m  Physical Exam Vitals and nursing note reviewed.   Constitutional:      General: She is not in acute distress.    Appearance: She is well-developed. She is not ill-appearing, toxic-appearing or diaphoretic.  HENT:     Head: Atraumatic.  Eyes:     Pupils: Pupils are equal, round, and reactive to light.  Cardiovascular:     Rate and Rhythm: Normal rate.     Heart sounds: Normal heart sounds.  Pulmonary:     Effort: Pulmonary effort is normal. No respiratory distress.     Breath sounds: Normal breath sounds.  Abdominal:     General: Bowel sounds are normal. There is no distension.     Palpations: Abdomen is soft.     Tenderness: There is no abdominal tenderness. There is no right CVA tenderness, left CVA tenderness, guarding or rebound.  Musculoskeletal:        General: Normal range of motion.     Cervical back: Normal range of motion.  Skin:    General: Skin is warm and dry.  Neurological:     General: No focal deficit present.     Mental Status: She is alert.  Psychiatric:        Mood and Affect: Mood normal.     ED Results / Procedures / Treatments   Labs (all labs ordered are listed, but only abnormal results are displayed) Labs Reviewed  COMPREHENSIVE METABOLIC PANEL - Abnormal; Notable for the following components:      Result Value   Glucose, Bld 102 (*)    Total Bilirubin 0.2 (*)    All other components within normal limits  CBC - Abnormal; Notable for the following components:   Hemoglobin 10.9 (*)    HCT 33.6 (*)    All other components within normal limits  HCG, SERUM, QUALITATIVE - Abnormal; Notable for the following components:   Preg, Serum POSITIVE (*)    All other components within normal limits  HCG, QUANTITATIVE, PREGNANCY - Abnormal; Notable for the following components:   hCG, Beta Chain, Quant, S 124,450 (*)    All other components within normal limits  LIPASE, BLOOD  URINALYSIS, ROUTINE W REFLEX MICROSCOPIC    EKG None  Radiology US OB LESS THAN 14 WEEKS WITH OB TRANSVAGINAL  Result  Date: 11/12/2022 CLINICAL DATA:  Worsening fatigue and abdominal pain. IVF. Beta HCG 124,000. LMP unknown. EXAM: OBSTETRIC <14 WK [place] AND TRANSVAGINAL OB US TECHNIQUE: Both transabdominal and transvaginal ultrasound examinations were performed for complete evaluation of the gestation as well as the maternal uterus, adnexal regions, and pelvic cul-de-sac. Transvaginal technique was performed to assess early pregnancy. COMPARISON:  None Available. FINDINGS: Intrauterine gestational sac: Single Yolk sac:  Visualized. Embryo:  Visualized. Cardiac Activity: Visualized. Heart  Rate: 157 bpm CRL:  24.5 mm   9 w   1 d                  [place] EDC: 06/16/2023 Subchorionic hemorrhage:  None visualized. Maternal uterus/adnexae: No acute abnormality.  No free fluid. IMPRESSION: Single living intrauterine gestation as described. No acute sonographic abnormality. Electronically Signed   By: Minerva Fester M.D.   On: 11/12/2022 17:32    Procedures Procedures    Medications Ordered in ED Medications  sodium chloride 0.9 % bolus 1,000 mL (1,000 mLs Intravenous New Bag/Given 11/12/22 1539)  ondansetron (ZOFRAN) injection 4 mg (4 mg Intravenous Given 11/12/22 1537)   ED Course/ Medical Decision Making/ A&P   32 year old G2, P1 approximately [redacted] weeks pregnant here for evaluation of fatigue and nausea in setting of pregnancy.  Taking B6 at home.  Recently received from IVF clinic.  Has had 2 ultrasounds which established single IUP.  No vaginal bleeding.  Feels generally fatigued, drained, sleeping more than normal.  No fever, cough, congestion, rhinorrhea, dysuria or hematuria.  Having normal stools.  Has had decreased p.o. intake.  Her heart and lungs are clear.  Abdomen soft, nontender.  She appears otherwise well.  I suspect dehydration, will plan on labs, imaging.  Labs and imaging personally viewed and interpreted:  CBC without leukocytosis, hemoglobin 10.9, similar to prior Metabolic panel glucose 102 Lipase 31 UA  negative for infection Quant 124,450 Ultrasound with single IUP, no complications  Discussed results with patient.  She was given IV fluids and Zofran.  Feeling much improved.  I suspect dehydration.  Will write for Diclegis.  Have her follow-up outpatient OB/GYN, return for new or worsening symptoms  On repeat exam patient does not have a surgical abdomin and there are no peritoneal signs.  No indication of appendicitis, bowel obstruction, bowel perforation, cholecystitis, diverticulitis, PID, toa, torsion or ectopic pregnancy.    The patient has been appropriately medically screened and/or stabilized in the ED. I have low suspicion for any other emergent medical condition which would require further screening, evaluation or treatment in the ED or require inpatient management.  Patient is hemodynamically stable and in no acute distress.  Patient able to ambulate in department prior to ED.  Evaluation does not show acute pathology that would require ongoing or additional emergent interventions while in the emergency department or further inpatient treatment.  I have discussed the diagnosis with the patient and answered all questions.  Pain is been managed while in the emergency department and patient has no further complaints prior to discharge.  Patient is comfortable with plan discussed in room and is stable for discharge at this time.  I have discussed strict return precautions for returning to the emergency department.  Patient was encouraged to follow-up with PCP/specialist refer to at discharge.                             Medical Decision Making Amount and/or Complexity of Data Reviewed Independent Historian: spouse External Data Reviewed: labs, radiology and notes. Labs: ordered. Decision-making details documented in ED Course. Radiology: ordered and independent interpretation performed. Decision-making details documented in ED Course.  Risk OTC drugs. Prescription drug  management. Parenteral controlled substances. Decision regarding hospitalization. Diagnosis or treatment significantly limited by social determinants of health.          Final Clinical Impression(s) / ED Diagnoses Final diagnoses:  Abdominal pain during pregnancy in first  trimester  Nausea and vomiting in pregnancy    Rx / DC Orders ED Discharge Orders          Ordered    Doxylamine-Pyridoxine ER (BONJESTA) 20-20 MG TBCR        11/12/22 1806              Peng Thorstenson A, PA-C 11/12/22 2255    Ernie Avena, MD 11/13/22 [id number]

## 2022-11-13 ENCOUNTER — Other Ambulatory Visit (HOSPITAL_COMMUNITY): Payer: Self-pay

## 2022-11-13 MED ORDER — BONJESTA 20-20 MG PO TBCR
1.0000 | EXTENDED_RELEASE_TABLET | Freq: Every day | ORAL | 0 refills | Status: DC
  Filled 2022-11-13: qty 60, 30d supply, fill #0

## 2022-11-15 ENCOUNTER — Other Ambulatory Visit (HOSPITAL_COMMUNITY): Payer: Self-pay

## 2022-12-04 ENCOUNTER — Other Ambulatory Visit (HOSPITAL_COMMUNITY): Payer: Self-pay

## 2022-12-04 DIAGNOSIS — Z113 Encounter for screening for infections with a predominantly sexual mode of transmission: Secondary | ICD-10-CM | POA: Diagnosis not present

## 2022-12-04 DIAGNOSIS — Z3480 Encounter for supervision of other normal pregnancy, unspecified trimester: Secondary | ICD-10-CM | POA: Diagnosis not present

## 2022-12-04 LAB — OB RESULTS CONSOLE RPR: RPR: NONREACTIVE

## 2022-12-04 LAB — HEPATITIS C ANTIBODY: HCV Ab: NEGATIVE

## 2022-12-04 LAB — OB RESULTS CONSOLE HIV ANTIBODY (ROUTINE TESTING): HIV: NONREACTIVE

## 2022-12-04 LAB — OB RESULTS CONSOLE GC/CHLAMYDIA
Chlamydia: NEGATIVE
Neisseria Gonorrhea: NEGATIVE

## 2022-12-04 LAB — OB RESULTS CONSOLE RUBELLA ANTIBODY, IGM: Rubella: IMMUNE

## 2022-12-04 LAB — OB RESULTS CONSOLE HEPATITIS B SURFACE ANTIGEN: Hepatitis B Surface Ag: NEGATIVE

## 2022-12-04 LAB — OB RESULTS CONSOLE ANTIBODY SCREEN: Antibody Screen: NEGATIVE

## 2022-12-04 MED ORDER — ONDANSETRON HCL 8 MG PO TABS
8.0000 mg | ORAL_TABLET | Freq: Three times a day (TID) | ORAL | 2 refills | Status: DC | PRN
  Filled 2022-12-04: qty 30, 10d supply, fill #0

## 2022-12-12 ENCOUNTER — Other Ambulatory Visit: Payer: Self-pay | Admitting: Obstetrics

## 2022-12-12 DIAGNOSIS — O09819 Supervision of pregnancy resulting from assisted reproductive technology, unspecified trimester: Secondary | ICD-10-CM

## 2022-12-12 DIAGNOSIS — Z363 Encounter for antenatal screening for malformations: Secondary | ICD-10-CM

## 2023-01-02 DIAGNOSIS — Z369 Encounter for antenatal screening, unspecified: Secondary | ICD-10-CM | POA: Diagnosis not present

## 2023-01-02 DIAGNOSIS — Z3482 Encounter for supervision of other normal pregnancy, second trimester: Secondary | ICD-10-CM | POA: Diagnosis not present

## 2023-01-03 ENCOUNTER — Other Ambulatory Visit (HOSPITAL_COMMUNITY): Payer: Self-pay

## 2023-01-09 ENCOUNTER — Encounter (HOSPITAL_COMMUNITY): Payer: Self-pay | Admitting: *Deleted

## 2023-01-09 ENCOUNTER — Inpatient Hospital Stay (HOSPITAL_COMMUNITY)
Admission: AD | Admit: 2023-01-09 | Discharge: 2023-01-09 | Disposition: A | Discharge status: Home / Self Care | Payer: 59 | Attending: Obstetrics and Gynecology | Admitting: Obstetrics and Gynecology

## 2023-01-09 DIAGNOSIS — W19XXXA Unspecified fall, initial encounter: Secondary | ICD-10-CM

## 2023-01-09 DIAGNOSIS — O26892 Other specified pregnancy related conditions, second trimester: Secondary | ICD-10-CM | POA: Diagnosis not present

## 2023-01-09 DIAGNOSIS — Z3A17 17 weeks gestation of pregnancy: Secondary | ICD-10-CM | POA: Insufficient documentation

## 2023-01-09 DIAGNOSIS — X58XXXA Exposure to other specified factors, initial encounter: Secondary | ICD-10-CM | POA: Diagnosis not present

## 2023-01-09 DIAGNOSIS — W010XXA Fall on same level from slipping, tripping and stumbling without subsequent striking against object, initial encounter: Secondary | ICD-10-CM | POA: Insufficient documentation

## 2023-01-09 LAB — URINALYSIS, ROUTINE W REFLEX MICROSCOPIC
Glucose, UA: NEGATIVE mg/dL
Hgb urine dipstick: NEGATIVE
Ketones, ur: NEGATIVE mg/dL
Leukocytes,Ua: NEGATIVE
Nitrite: NEGATIVE
Protein, ur: NEGATIVE mg/dL
Specific Gravity, Urine: 1.026 (ref 1.005–1.030)
pH: 5 (ref 5.0–8.0)

## 2023-01-09 NOTE — MAU Note (Addendum)
.  Valerie Graham is a 32 y.o. at [redacted]w[redacted]d here in MAU reporting sitting in the floor getting her 67mo old for bed. Was trying to pick up her baby and stand at the same time. Lost balance and fell on L knee and fell onto L side. Denies LOF or VB. Occ mild cramping that started after fall. Onset of complaint: tonight at 1930 Pain score: 3 Vitals:   01/09/23 2027  Pulse: 74  Resp: 17  Temp: (!) 97.5 F (36.4 C)  SpO2: 100%     FHT:156 Lab orders placed from triage:  u/a

## 2023-01-09 NOTE — MAU Provider Note (Signed)
Chief Complaint:  Fall   Event Date/Time   First Provider Initiated Contact with Patient 01/09/23 2114     HPI: Valerie Graham is a 32 y.o. G2P1001 at 51w4dwho presents to maternity admissions reporting having fallen while standing up tonight.  Landed on knee then hip.  No blow to abdomen.  Has some cramping.  . She denies LOF, vaginal bleeding.    Fall The accident occurred 1 to 3 hours ago. She fell from a height of 1 to 2 ft. She landed on [place]. There was no blood loss. The point of impact was the left hip. Associated symptoms include abdominal pain (cramping). Pertinent negatives include no fever. She has tried nothing for the symptoms.   RN Note: [name] is a 32 y.o. at [redacted]w[redacted]d here in MAU reporting sitting in the floor getting her 67mo old for bed. Was trying to pick up her baby and stand at the same time. Lost balance and fell on L knee and fell onto L side. Denies LOF or VB. Occ mild cramping that started after fall. Onset of complaint: tonight at 1930 Pain score: 3  Past Medical History: Past Medical History:  Diagnosis Date   Anxiety    Chicken pox    Depression    Family history of ovarian cancer    Family history of prostate cancer    Family history of skin cancer    Herpes simplex without mention of complication 07/02/11    Past obstetric history: OB History  Gravida Para Term Preterm AB Living  2 1 1  0 0 1  SAB IAB Ectopic Multiple Live Births  0 0 0 0 1    # Outcome Date GA Lbr Len/2nd Weight Sex Type Anes PTL Lv  2 Current           1 Term 07/19/21 [redacted]w[redacted]d 16:41 / 02:56 4640 g M CS-LTranv EPI  LIV    Past Surgical History: Past Surgical History:  Procedure Laterality Date   CESAREAN SECTION N/A 07/19/2021   Procedure: CESAREAN SECTION;  Surgeon: Edwinna Areola, DO;  Location: [facility]S;  Service: Obstetrics;  Laterality: N/A;   TONSILLECTOMY  2014   WISDOM TOOTH EXTRACTION      Family History: Family History  Problem Relation Age of Onset    Cancer Paternal Grandfather    Cancer Maternal Grandmother 35       breast   Cancer Father 63       bladder/prostate metastatic, neg genetic testing 2019   Endometriosis Mother 28       hysterectomy   Diabetes Maternal Aunt    Cancer Paternal Uncle 18       prostate   Stroke Paternal Uncle    Cancer Paternal Aunt        ovarian   Cancer Other        colon cancer    Social History: Social History   Tobacco Use   Smoking status: Never   Smokeless tobacco: Never  Vaping Use   Vaping status: Never Used  Substance Use Topics   Alcohol use: Not Currently   Drug use: No    Allergies:  Allergies  Allergen Reactions   Penicillins Other (See Comments)    Reaction occurred as a child and pt is uncertain what the reaction was    Meds:  Medications Prior to Admission  Medication Sig Dispense Refill Last Dose   albuterol (PROAIR HFA) 108 (90 Base) MCG/ACT inhaler Inhale 2 puffs every 4 hours  as directed. (Patient not taking: Reported on 07/11/2022) 36 g 6    ciprofloxacin (CIPRO) 500 MG tablet Take 1 tablet (500 mg total) by mouth 2 (two) times daily. 20 tablet 1    doxycycline (VIBRAMYCIN) 100 MG capsule Take 1 capsule (100 mg total) by mouth 2 (two) times daily for 5 days 10 capsule 2    doxycycline (VIBRAMYCIN) 100 MG capsule Take 1 capsule (100 mg total) by mouth 2 (two) times daily for 5 days. 10 capsule 2    Doxylamine-Pyridoxine ER (BONJESTA) 20-20 MG TBCR Take 1 tablet orally at bedtime.  If still having symptoms may increase to 1 tablet in the morning and 1 tablet in the evening, max 2 tablets daily 60 tablet 0    Doxylamine-Pyridoxine ER (BONJESTA) 20-20 MG TBCR Take 1 tablet by mouth at bedtime. If still having symptoms, may increase to 1 tablet in the morning and 1 in the evening. 60 tablet 0    estradiol (ESTRACE) 2 MG tablet Take 1 tablet (2 mg total) by mouth 2 (two) times daily. 60 tablet 3    estradiol (VIVELLE-DOT) 0.1 MG/24HR patch Apply 1 patch to skin every 3 days  8 patch 3    estradiol (VIVELLE-DOT) 0.1 MG/24HR patch Place 2 patches (0.2 mg total) onto the skin every 3 (three) days. 30 patch 3    methylPREDNISolone (MEDROL) 8 MG tablet Take 1 tablet (8 mg total) by mouth 2 (two) times daily. 8 tablet 1    metroNIDAZOLE (FLAGYL) 500 MG tablet Take 1 tablet (500 mg total) by mouth 2 (two) times daily. 20 tablet 1    naproxen (NAPROSYN) 500 MG tablet Take 1 tablet (500 mg total) by mouth 2 (two) times daily with a meal. 30 tablet 0    NEEDLE, DISP, 22 G 22G X 1-1/2" MISC Use to inject progesterone 30 each 3    norethindrone (AYGESTIN) 5 MG tablet Take 1 tablet (5 mg total) by mouth every morning. 10 tablet 0    ondansetron (ZOFRAN) 8 MG tablet Take 1 tablet (8 mg total) by mouth 3 (three) times daily as needed. 30 tablet 2    pentoxifylline (TRENTAL) 400 MG CR tablet Take 1 tablet (400 mg total) by mouth 3 (three) times daily. 30 tablet 1    progesterone (PROMETRIUM) 200 MG capsule Take 1 capsule (200 mg total) by mouth at bedtime. 14 capsule 0    progesterone (PROMETRIUM) 200 MG capsule Take 1 capsule (200 mg) by mouth at bedtime. 14 capsule 0    progesterone 50 MG/ML injection Inject 1 ML intramuscularly daily 30 mL 3    sertraline (ZOLOFT) 50 MG tablet Take 1 tablet (50 mg total) by mouth daily. 90 tablet 3    SYRINGE-NEEDLE, DISP, 3 ML (B-D 3CC LUER-LOK SYR 18GX1-1/2) 18G X 1-1/2" 3 ML MISC Use to draw out Progesterone 30 each 3     I have reviewed patient's Past Medical Hx, Surgical Hx, Family Hx, Social Hx, medications and allergies.   ROS:  Review of Systems  Constitutional:  Negative for fever.  Respiratory:  Negative for shortness of breath.   Gastrointestinal:  Positive for abdominal pain (cramping).  Genitourinary:  Negative for vaginal bleeding and vaginal discharge.  Musculoskeletal:  Negative for back pain and neck pain.   Other systems negative  Physical Exam  Patient Vitals for the past 24 hrs:  BP Temp Pulse Resp SpO2 Height Weight   01/09/23 2033 106/64 -- -- -- -- -- --  01/09/23 2027 -- Marland Kitchen  97.5 F (36.4 C) 74 17 100 % 5\' 6"  (1.676 m) 80.7 kg   Constitutional: Well-developed, well-nourished female in no acute distress.  Cardiovascular: normal rate  Respiratory: normal effort GI: Abd soft, non-tender, gravid appropriate for gestational age.   No rebound or guarding. MS: Extremities nontender, no edema, normal ROM Neurologic: Alert and oriented x 4.  GU: Neg CVAT.   FHT:  156   Labs: No results found for this or any previous visit (from the past 24 hour(s)).    Imaging:  No results found.  MAU Course/MDM: I have reviewed the triage vital signs and the nursing notes.   Pertinent labs & imaging results that were available during my care of the patient were reviewed by me and considered in my medical decision making (see chart for details).      I have reviewed her medical records including past results, notes and treatments.   Treatments in MAU included Bedside US done for reassurance.  Placenta anterior, no obvious bleeding, Fetus is active.  Steady HR in 160s   Normal amniotic fluid. .    Assessment: Single IUP  at [redacted]w[redacted]d Fall onto hip from standing position Uterine cramping  Plan: Discharge home May take one dose of ibuprofen for cramping Reassured fetus appears unharmed. Follow up in Office for prenatal visits  Encouraged to return if she develops worsening of symptoms, increase in pain, fever, or other concerning symptoms.    Pt stable at time of discharge.  Wynelle Bourgeois CNM, MSN Certified Nurse-Midwife 01/09/2023 9:14 PM

## 2023-01-09 NOTE — MAU Note (Signed)
Wynelle Bourgeois CNM in [facility] RM to see pt and discuss plan of care.

## 2023-01-17 ENCOUNTER — Encounter: Payer: Self-pay | Admitting: *Deleted

## 2023-01-17 DIAGNOSIS — O09819 Supervision of pregnancy resulting from assisted reproductive technology, unspecified trimester: Secondary | ICD-10-CM | POA: Insufficient documentation

## 2023-01-22 ENCOUNTER — Ambulatory Visit: Payer: 59 | Attending: Obstetrics

## 2023-01-22 ENCOUNTER — Encounter: Payer: Self-pay | Admitting: *Deleted

## 2023-01-22 ENCOUNTER — Other Ambulatory Visit: Payer: Self-pay | Admitting: *Deleted

## 2023-01-22 ENCOUNTER — Ambulatory Visit: Payer: 59 | Admitting: *Deleted

## 2023-01-22 VITALS — BP 102/59 | HR 71

## 2023-01-22 DIAGNOSIS — O09812 Supervision of pregnancy resulting from assisted reproductive technology, second trimester: Secondary | ICD-10-CM

## 2023-01-22 DIAGNOSIS — O09819 Supervision of pregnancy resulting from assisted reproductive technology, unspecified trimester: Secondary | ICD-10-CM | POA: Diagnosis present

## 2023-01-22 DIAGNOSIS — Z3A19 19 weeks gestation of pregnancy: Secondary | ICD-10-CM

## 2023-01-22 DIAGNOSIS — O34219 Maternal care for unspecified type scar from previous cesarean delivery: Secondary | ICD-10-CM

## 2023-01-22 DIAGNOSIS — Z363 Encounter for antenatal screening for malformations: Secondary | ICD-10-CM | POA: Insufficient documentation

## 2023-01-29 DIAGNOSIS — Z369 Encounter for antenatal screening, unspecified: Secondary | ICD-10-CM | POA: Diagnosis not present

## 2023-02-21 ENCOUNTER — Other Ambulatory Visit: Payer: Self-pay | Admitting: *Deleted

## 2023-02-21 ENCOUNTER — Ambulatory Visit: Payer: 59 | Attending: Maternal & Fetal Medicine

## 2023-02-21 ENCOUNTER — Other Ambulatory Visit: Payer: Self-pay

## 2023-02-21 DIAGNOSIS — Z3A23 23 weeks gestation of pregnancy: Secondary | ICD-10-CM

## 2023-02-21 DIAGNOSIS — O09812 Supervision of pregnancy resulting from assisted reproductive technology, second trimester: Secondary | ICD-10-CM | POA: Diagnosis not present

## 2023-02-21 DIAGNOSIS — O34219 Maternal care for unspecified type scar from previous cesarean delivery: Secondary | ICD-10-CM | POA: Diagnosis not present

## 2023-02-26 DIAGNOSIS — Z3482 Encounter for supervision of other normal pregnancy, second trimester: Secondary | ICD-10-CM | POA: Diagnosis not present

## 2023-03-03 ENCOUNTER — Encounter (HOSPITAL_BASED_OUTPATIENT_CLINIC_OR_DEPARTMENT_OTHER): Payer: Self-pay

## 2023-03-03 ENCOUNTER — Other Ambulatory Visit: Payer: Self-pay

## 2023-03-03 ENCOUNTER — Emergency Department (HOSPITAL_BASED_OUTPATIENT_CLINIC_OR_DEPARTMENT_OTHER)
Admission: EM | Admit: 2023-03-03 | Discharge: 2023-03-03 | Disposition: A | Discharge status: Home / Self Care | Payer: 59 | Attending: Emergency Medicine | Admitting: Emergency Medicine

## 2023-03-03 DIAGNOSIS — R6884 Jaw pain: Secondary | ICD-10-CM | POA: Insufficient documentation

## 2023-03-03 NOTE — ED Triage Notes (Signed)
Complaining of her jaw popping at noon on Friday. Since she feels like she can't close her mouth or chew correctly. Hurts on the left side mainly.

## 2023-03-03 NOTE — Discharge Instructions (Signed)
Soft diet for at least the next week.  Tylenol around-the-clock.  Please call the ear nose and throat doctor on Monday to set up an appointment.  A mouthguard at night can also help.

## 2023-03-03 NOTE — ED Provider Notes (Signed)
[place] EMERGENCY DEPARTMENT AT [facility] Provider Note   CSN: [id number] Arrival date & time: 03/03/23  0141     History  Chief Complaint  Patient presents with   Jaw Pain    Valerie Graham is a 32 y.o. female.  32 yo F with a chief complaints of left-sided jaw pain.  The patient said that she went to eat an apple and noticed some pain to that left side.  Has been occurring whenever she tries to open her mouth fully and feels a popping sensation.  She is able to close her jaw all the way.  Denies trauma.        Home Medications Prior to Admission medications   Medication Sig Start Date End Date Taking? Authorizing Provider  albuterol (PROAIR HFA) 108 (90 Base) MCG/ACT inhaler Inhale 2 puffs every 4 hours as directed. Patient not taking: Reported on 07/11/2022 12/30/20     Doxylamine-Pyridoxine ER (BONJESTA) 20-20 MG TBCR Take 1 tablet orally at bedtime.  If still having symptoms may increase to 1 tablet in the morning and 1 tablet in the evening, max 2 tablets daily Patient not taking: Reported on 01/22/2023 11/12/22   Henderly, Britni A, PA-C  Doxylamine-Pyridoxine ER (BONJESTA) 20-20 MG TBCR Take 1 tablet by mouth at bedtime. If still having symptoms, may increase to 1 tablet in the morning and 1 in the evening. Patient not taking: Reported on 01/22/2023 11/12/22   Henderly, Britni A, PA-C  estradiol (ESTRACE) 2 MG tablet Take 1 tablet (2 mg total) by mouth 2 (two) times daily. Patient not taking: Reported on 01/22/2023 08/02/22     methylPREDNISolone (MEDROL) 8 MG tablet Take 1 tablet (8 mg total) by mouth 2 (two) times daily. Patient not taking: Reported on 01/22/2023 08/02/22     metroNIDAZOLE (FLAGYL) 500 MG tablet Take 1 tablet (500 mg total) by mouth 2 (two) times daily. Patient not taking: Reported on 01/22/2023 08/21/22     NEEDLE, DISP, 22 G 22G X 1-1/2" MISC Use to inject progesterone 08/02/22     ondansetron (ZOFRAN) 8 MG tablet Take 1 tablet (8 mg total) by mouth 3  (three) times daily as needed. 12/04/22     pentoxifylline (TRENTAL) 400 MG CR tablet Take 1 tablet (400 mg total) by mouth 3 (three) times daily. Patient not taking: Reported on 01/22/2023 09/20/22     progesterone (PROMETRIUM) 200 MG capsule Take 1 capsule (200 mg total) by mouth at bedtime. Patient not taking: Reported on 01/22/2023 11/03/22     progesterone (PROMETRIUM) 200 MG capsule Take 1 capsule (200 mg) by mouth at bedtime. Patient not taking: Reported on 01/22/2023 11/09/22     progesterone 50 MG/ML injection Inject 1 ML intramuscularly daily Patient not taking: Reported on 01/22/2023 08/02/22     sertraline (ZOLOFT) 50 MG tablet Take 1 tablet (50 mg total) by mouth daily. 01/10/22 02/25/23  Nche, Bonna Gains, NP  SYRINGE-NEEDLE, DISP, 3 ML (B-D 3CC LUER-LOK SYR 18GX1-1/2) 18G X 1-1/2" 3 ML MISC Use to draw out Progesterone 08/02/22         Allergies    Penicillins    Review of Systems   Review of Systems  Physical Exam Updated Vital Signs BP 111/71 (BP Location: Right Arm)   Pulse 83   Temp 98.3 F (36.8 C) (Oral)   Resp 18   Ht 5\' 7"  (1.702 m)   Wt 80.7 kg   LMP 12/30/2021 (Exact Date)   SpO2 100%   BMI 27.88  kg/m  Physical Exam Vitals and nursing note reviewed.  Constitutional:      General: She is not in acute distress.    Appearance: She is well-developed. She is not diaphoretic.  HENT:     Head: Normocephalic and atraumatic.     Mouth/Throat:     Comments: No misalignment of teeth.  She is able to open her jaw fully and there is a pop when she opens it fully and then when she closes it it moves back into position. Eyes:     Pupils: Pupils are equal, round, and reactive to light.  Cardiovascular:     Rate and Rhythm: Normal rate and regular rhythm.     Heart sounds: No murmur heard.    No friction rub. No gallop.  Pulmonary:     Effort: Pulmonary effort is normal.     Breath sounds: No wheezing or rales.  Abdominal:     General: There is no distension.      Palpations: Abdomen is soft.     Tenderness: There is no abdominal tenderness.  Musculoskeletal:        General: No tenderness.     Cervical back: Normal range of motion and neck supple.  Skin:    General: Skin is warm and dry.  Neurological:     Mental Status: She is alert and oriented to person, place, and time.  Psychiatric:        Behavior: Behavior normal.     ED Results / Procedures / Treatments   Labs (all labs ordered are listed, but only abnormal results are displayed) Labs Reviewed - No data to display  EKG None  Radiology No results found.  Procedures Procedures    Medications Ordered in ED Medications - No data to display  ED Course/ Medical Decision Making/ A&P                                 Medical Decision Making  32 yo F with a chief complaints of left-sided jaw pain.  She is able to close her mouth fully without issue.  She could be having some subluxation at the TMJ with fully opening.  I encouraged her to eat a soft diet.  Will have her follow-up with ENT.  2:18 AM:  I have discussed the diagnosis/risks/treatment options with the patient and family.  Evaluation and diagnostic testing in the emergency department does not suggest an emergent condition requiring admission or immediate intervention beyond what has been performed at this time.  They will follow up with PCP, ENT. We also discussed returning to the ED immediately if new or worsening sx occur. We discussed the sx which are most concerning (e.g., sudden worsening pain, fever, inability to tolerate by mouth) that necessitate immediate return. Medications administered to the patient during their visit and any new prescriptions provided to the patient are listed below.  Medications given during this visit Medications - No data to display   The patient appears reasonably screen and/or stabilized for discharge and I doubt any other medical condition or other [facility] requiring further screening,  evaluation, or treatment in the ED at this time prior to discharge.          Final Clinical Impression(s) / ED Diagnoses Final diagnoses:  Jaw pain    Rx / DC Orders ED Discharge Orders     None         Melene Plan, DO  03/03/23 0218  

## 2023-03-03 NOTE — ED Notes (Signed)
Reviewed AVS with patient, patient expressed understanding of directions, denies further questions at this time. 

## 2023-03-05 ENCOUNTER — Telehealth (INDEPENDENT_AMBULATORY_CARE_PROVIDER_SITE_OTHER): Payer: Self-pay

## 2023-03-05 ENCOUNTER — Telehealth: Payer: Self-pay

## 2023-03-05 NOTE — Telephone Encounter (Signed)
Called to completed TOC/TCM call. [facility] provider not listed as current PCP. Last OV at [facility] 07/20/22. LVM for pt to return my call.

## 2023-03-05 NOTE — Telephone Encounter (Signed)
Lvm for patient to schedule ED referral.  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  Let patient decide and have clinical team provide best level care versus a non clinical member tell the patient what to do based on her ED Visit.

## 2023-03-05 NOTE — Transitions of Care (Post Inpatient/ED Visit) (Signed)
   03/05/2023  Name: Valerie Graham MRN: [id number] DOB: 01/24/1991  Today's TOC FU Call Status: Today's TOC FU Call Status:: Successful TOC FU Call Completed TOC FU Call Complete Date: 03/05/23 Patient's Name and Date of Birth confirmed.  Transition Care Management Follow-up Telephone Call Date of Discharge: 03/03/23 Discharge Facility: [facility] ([facility]) Type of Discharge: Emergency Department How have you been since you were released from the hospital?: Better  Items Reviewed: Did you receive and understand the discharge instructions provided?: Yes Any new allergies since your discharge?: No Dietary orders reviewed?: NA Do you have support at home?: Yes  Medications Reviewed Today: Medications Reviewed Today   Medications were not reviewed in this encounter     Home Care and Equipment/Supplies: Were Home Health Services Ordered?: NA Any new equipment or medical supplies ordered?: NA  Functional Questionnaire: Do you need assistance with bathing/showering or dressing?: No Do you need assistance with meal preparation?: No Do you need assistance with eating?: No Do you have difficulty maintaining continence: No Do you need assistance with getting out of bed/getting out of a chair/moving?: No Do you have difficulty managing or taking your medications?: No  Follow up appointments reviewed: PCP Follow-up appointment confirmed?: NA Specialist Hospital Follow-up appointment confirmed?: NA Do you need transportation to your follow-up appointment?: No Do you understand care options if your condition(s) worsen?: Yes-patient verbalized understanding    SIGNATURE Arvil Persons, BSN, RN

## 2023-03-08 ENCOUNTER — Other Ambulatory Visit: Payer: Self-pay | Admitting: Nurse Practitioner

## 2023-03-08 DIAGNOSIS — F411 Generalized anxiety disorder: Secondary | ICD-10-CM

## 2023-03-09 ENCOUNTER — Other Ambulatory Visit (HOSPITAL_COMMUNITY): Payer: Self-pay

## 2023-03-13 ENCOUNTER — Other Ambulatory Visit (HOSPITAL_COMMUNITY): Payer: Self-pay

## 2023-03-13 ENCOUNTER — Encounter: Payer: Self-pay | Admitting: Nurse Practitioner

## 2023-03-13 ENCOUNTER — Telehealth (INDEPENDENT_AMBULATORY_CARE_PROVIDER_SITE_OTHER): Payer: 59 | Admitting: Nurse Practitioner

## 2023-03-13 DIAGNOSIS — F325 Major depressive disorder, single episode, in full remission: Secondary | ICD-10-CM | POA: Insufficient documentation

## 2023-03-13 DIAGNOSIS — F411 Generalized anxiety disorder: Secondary | ICD-10-CM | POA: Diagnosis not present

## 2023-03-13 MED ORDER — SERTRALINE HCL 50 MG PO TABS
50.0000 mg | ORAL_TABLET | Freq: Every day | ORAL | 3 refills | Status: DC
  Filled 2023-03-13: qty 90, 90d supply, fill #0

## 2023-03-13 NOTE — Assessment & Plan Note (Signed)
Stable mood with zoloft Med refill sent F/up in 6months

## 2023-03-13 NOTE — Progress Notes (Signed)
Virtual Visit via Video Note  I connected withNAME@ on 03/13/23 at  8:00 AM EST by a video enabled telemedicine application and verified that I am speaking with Valerie correct person using two identifiers.  Location: Graham:Home Provider: Office Participants: Graham and provider  I discussed Valerie limitations of evaluation and management by telemedicine and Valerie availability of in person appointments. I also discussed with Valerie Graham that there may be a Graham responsible charge related to this service. Valerie Graham expressed understanding and agreed to proceed.  [name] and depression f/up  History of Present Illness:  GAD (generalized anxiety disorder) Stable mood with zoloft Med refill sent F/up in 6months     03/13/2023    7:53 AM 07/11/2022    4:07 PM 01/10/2022    1:39 PM  Depression screen PHQ 2/9  Decreased Interest 0 0 1  Down, Depressed, Hopeless 0 0 1  PHQ - 2 Score 0 0 2  Altered sleeping 0  2  Tired, decreased energy 1  0  Change in appetite 0  0  Feeling bad or failure about yourself  0  2  Trouble concentrating 0  3  Moving slowly or fidgety/restless 0  0  Suicidal thoughts 0  0  PHQ-9 Score 1  9  Difficult doing work/chores Not difficult at all  Somewhat difficult       03/13/2023    7:53 AM 01/10/2022    1:39 PM 10/08/2020    1:32 PM 06/18/2020    1:25 PM  GAD 7 : Generalized Anxiety Score  Nervous, Anxious, on Edge 1 3 1 2   Control/stop worrying 1 2 1 1   Worry too much - different things 1 2 1 1   Trouble relaxing 1 1 2 1   Restless 1 1 2  0  Easily annoyed or irritable 1 2 1 1   Afraid - awful might happen 1 3 2 1   Total GAD 7 Score 7 14 10 7   Anxiety Difficulty Not difficult at all Somewhat difficult Not difficult at all Somewhat difficult   Observations/Objective: Physical Exam Vitals and nursing note reviewed.  Pulmonary:     Effort: Pulmonary effort is normal.  Neurological:     Mental Status: Valerie Graham is alert and oriented to person, place, and  time.  Psychiatric:        Mood and Affect: Mood normal.        Behavior: Behavior normal.        Thought Content: Thought content normal.     Assessment and Plan: Valerie Graham was seen today for medication managment .  Diagnoses and all orders for this visit:  Depression, major, single episode, complete remission (HCC)  GAD (generalized anxiety disorder) -     sertraline (ZOLOFT) 50 MG tablet; Take 1 tablet (50 mg total) by mouth daily.   Follow Up Instructions: See instructions above   I discussed Valerie assessment and treatment plan with Valerie Graham. Valerie Graham was provided an opportunity to ask questions and all were answered. Valerie Graham agreed with Valerie plan and demonstrated an understanding of Valerie instructions.   Valerie Graham was advised to call back or seek an in-person evaluation if Valerie symptoms worsen or if Valerie condition fails to improve as anticipated.  Alysia Penna, NP

## 2023-03-28 ENCOUNTER — Other Ambulatory Visit: Payer: Self-pay

## 2023-03-28 ENCOUNTER — Ambulatory Visit: Payer: 59 | Attending: Maternal & Fetal Medicine

## 2023-03-28 ENCOUNTER — Other Ambulatory Visit: Payer: Self-pay | Admitting: *Deleted

## 2023-03-28 DIAGNOSIS — Z362 Encounter for other antenatal screening follow-up: Secondary | ICD-10-CM | POA: Diagnosis not present

## 2023-03-28 DIAGNOSIS — O09819 Supervision of pregnancy resulting from assisted reproductive technology, unspecified trimester: Secondary | ICD-10-CM | POA: Diagnosis not present

## 2023-03-28 DIAGNOSIS — Z3A28 28 weeks gestation of pregnancy: Secondary | ICD-10-CM | POA: Diagnosis not present

## 2023-03-28 DIAGNOSIS — O09813 Supervision of pregnancy resulting from assisted reproductive technology, third trimester: Secondary | ICD-10-CM | POA: Diagnosis not present

## 2023-03-28 DIAGNOSIS — O34219 Maternal care for unspecified type scar from previous cesarean delivery: Secondary | ICD-10-CM | POA: Diagnosis not present

## 2023-03-28 DIAGNOSIS — O283 Abnormal ultrasonic finding on antenatal screening of mother: Secondary | ICD-10-CM

## 2023-04-04 DIAGNOSIS — Z3480 Encounter for supervision of other normal pregnancy, unspecified trimester: Secondary | ICD-10-CM | POA: Diagnosis not present

## 2023-04-04 DIAGNOSIS — Z369 Encounter for antenatal screening, unspecified: Secondary | ICD-10-CM | POA: Diagnosis not present

## 2023-04-04 DIAGNOSIS — O99013 Anemia complicating pregnancy, third trimester: Secondary | ICD-10-CM | POA: Diagnosis not present

## 2023-04-06 ENCOUNTER — Other Ambulatory Visit (HOSPITAL_COMMUNITY): Payer: Self-pay

## 2023-04-11 ENCOUNTER — Telehealth: Payer: Self-pay

## 2023-04-11 NOTE — Telephone Encounter (Unsigned)
Copied from CRM [id number]. Topic: Appointments - Appointment Scheduling >> Apr 10, 2023  3:06 PM Alvino Blood C wrote: Patient/patient representative is calling to schedule an appointment. Refer to attachments for appointment information.

## 2023-04-12 ENCOUNTER — Non-Acute Institutional Stay (HOSPITAL_COMMUNITY)
Admission: RE | Admit: 2023-04-12 | Discharge: 2023-04-12 | Disposition: A | Discharge status: Home / Self Care | Payer: 59 | Source: Ambulatory Visit | Attending: Internal Medicine | Admitting: Internal Medicine

## 2023-04-12 DIAGNOSIS — O99019 Anemia complicating pregnancy, unspecified trimester: Secondary | ICD-10-CM | POA: Diagnosis present

## 2023-04-12 DIAGNOSIS — Z3A00 Weeks of gestation of pregnancy not specified: Secondary | ICD-10-CM | POA: Diagnosis not present

## 2023-04-12 MED ORDER — SODIUM CHLORIDE 0.9 % IV SOLN
510.0000 mg | Freq: Once | INTRAVENOUS | Status: AC
  Administered 2023-04-12: 510 mg via INTRAVENOUS
  Filled 2023-04-12: qty 17

## 2023-04-12 MED ORDER — SODIUM CHLORIDE 0.9 % IV SOLN: INTRAVENOUS | Status: DC | PRN

## 2023-04-12 NOTE — Progress Notes (Addendum)
PATIENT CARE CENTER NOTE   Diagnosis: Anemia complicating pregnancy, unspecified trimester O99.019   Provider: Waynard Reeds, DO   Procedure: Feraheme infusion (dose # 1 of 3)   Note: Patient received Feraheme infusion (1 of 3) via PIV. Patient tolerated infusion well with no adverse reaction. Observed patient for 30 minutes post infusion. Vital signs stable. Discharge instructions given. Patient to come back in 1 week for second infusion and will schedule next appointments at the front desk. Patient alert, oriented and ambulatory at discharge.

## 2023-04-19 ENCOUNTER — Non-Acute Institutional Stay (HOSPITAL_COMMUNITY)
Admission: RE | Admit: 2023-04-19 | Discharge: 2023-04-19 | Disposition: A | Discharge status: Home / Self Care | Payer: 59 | Source: Ambulatory Visit | Attending: Internal Medicine | Admitting: Internal Medicine

## 2023-04-19 DIAGNOSIS — O99019 Anemia complicating pregnancy, unspecified trimester: Secondary | ICD-10-CM | POA: Diagnosis present

## 2023-04-19 DIAGNOSIS — Z23 Encounter for immunization: Secondary | ICD-10-CM | POA: Diagnosis not present

## 2023-04-19 MED ORDER — SODIUM CHLORIDE 0.9 % IV SOLN
510.0000 mg | Freq: Once | INTRAVENOUS | Status: AC
  Administered 2023-04-19: 510 mg via INTRAVENOUS
  Filled 2023-04-19: qty 17

## 2023-04-19 MED ORDER — SODIUM CHLORIDE 0.9 % IV SOLN: INTRAVENOUS | Status: DC | PRN

## 2023-04-19 NOTE — Progress Notes (Signed)
PATIENT CARE CENTER NOTE     Diagnosis: Anemia complicating pregnancy, unspecified trimester O99.019     Provider: Waynard Reeds, DO     Procedure: Feraheme infusion (dose # 2 of 3)     Note: Patient received Feraheme infusion (2 of 3) via PIV. Patient tolerated infusion well with no adverse reaction. Observed patient for 30 minutes post infusion. Vital signs stable. Printed AVS offered but patient refused. Patient scheduled to come back on 12/27 for final infusion. Patient alert, oriented and ambulatory at discharge.

## 2023-04-26 ENCOUNTER — Ambulatory Visit: Payer: 59 | Attending: Maternal & Fetal Medicine

## 2023-04-26 ENCOUNTER — Other Ambulatory Visit: Payer: Self-pay

## 2023-04-26 DIAGNOSIS — Z3A32 32 weeks gestation of pregnancy: Secondary | ICD-10-CM

## 2023-04-26 DIAGNOSIS — O34219 Maternal care for unspecified type scar from previous cesarean delivery: Secondary | ICD-10-CM

## 2023-04-26 DIAGNOSIS — O283 Abnormal ultrasonic finding on antenatal screening of mother: Secondary | ICD-10-CM | POA: Insufficient documentation

## 2023-04-26 DIAGNOSIS — O09813 Supervision of pregnancy resulting from assisted reproductive technology, third trimester: Secondary | ICD-10-CM

## 2023-04-27 ENCOUNTER — Non-Acute Institutional Stay (HOSPITAL_COMMUNITY)
Admission: RE | Admit: 2023-04-27 | Discharge: 2023-04-27 | Disposition: A | Discharge status: Home / Self Care | Payer: 59 | Source: Ambulatory Visit | Attending: Internal Medicine | Admitting: Internal Medicine

## 2023-04-27 DIAGNOSIS — O99019 Anemia complicating pregnancy, unspecified trimester: Secondary | ICD-10-CM | POA: Insufficient documentation

## 2023-04-27 MED ORDER — SODIUM CHLORIDE 0.9 % IV SOLN: INTRAVENOUS | Status: DC | PRN

## 2023-04-27 MED ORDER — SODIUM CHLORIDE 0.9 % IV SOLN
510.0000 mg | Freq: Once | INTRAVENOUS | Status: AC
  Administered 2023-04-27: 510 mg via INTRAVENOUS
  Filled 2023-04-27: qty 510

## 2023-04-27 NOTE — Progress Notes (Signed)
PATIENT CARE CENTER NOTE     Diagnosis: Anemia complicating pregnancy, unspecified trimester O99.019     Provider: Waynard Reeds, DO     Procedure: Feraheme infusion (dose #3 of 3)     Note: Patient received Feraheme infusion (3 of 3) via PIV. Patient tolerated infusion well with no adverse reaction. Observed patient for 30 minutes post infusion. Vital signs stable. Printed AVS offered but patient refused. Patient alert, oriented and ambulatory at discharge.

## 2023-05-01 DIAGNOSIS — Z369 Encounter for antenatal screening, unspecified: Secondary | ICD-10-CM | POA: Diagnosis not present

## 2023-05-01 DIAGNOSIS — Z3480 Encounter for supervision of other normal pregnancy, unspecified trimester: Secondary | ICD-10-CM | POA: Diagnosis not present

## 2023-05-01 DIAGNOSIS — Z3A33 33 weeks gestation of pregnancy: Secondary | ICD-10-CM | POA: Diagnosis not present

## 2023-05-06 ENCOUNTER — Encounter (HOSPITAL_COMMUNITY): Payer: Self-pay | Admitting: Obstetrics and Gynecology

## 2023-05-06 ENCOUNTER — Other Ambulatory Visit (HOSPITAL_COMMUNITY): Payer: Self-pay

## 2023-05-06 ENCOUNTER — Inpatient Hospital Stay (HOSPITAL_COMMUNITY)
Admission: AD | Admit: 2023-05-06 | Discharge: 2023-05-06 | Disposition: A | Discharge status: Home / Self Care | Payer: 59 | Attending: Obstetrics and Gynecology | Admitting: Obstetrics and Gynecology

## 2023-05-06 DIAGNOSIS — Z3A34 34 weeks gestation of pregnancy: Secondary | ICD-10-CM | POA: Insufficient documentation

## 2023-05-06 DIAGNOSIS — O2313 Infections of bladder in pregnancy, third trimester: Secondary | ICD-10-CM

## 2023-05-06 DIAGNOSIS — R42 Dizziness and giddiness: Secondary | ICD-10-CM | POA: Diagnosis not present

## 2023-05-06 DIAGNOSIS — R030 Elevated blood-pressure reading, without diagnosis of hypertension: Secondary | ICD-10-CM | POA: Diagnosis not present

## 2023-05-06 DIAGNOSIS — O26893 Other specified pregnancy related conditions, third trimester: Secondary | ICD-10-CM | POA: Insufficient documentation

## 2023-05-06 LAB — COMPREHENSIVE METABOLIC PANEL
ALT: 11 U/L (ref 0–44)
AST: 22 U/L (ref 15–41)
Albumin: 2.5 g/dL — ABNORMAL LOW (ref 3.5–5.0)
Alkaline Phosphatase: 116 U/L (ref 38–126)
Anion gap: 10 (ref 5–15)
BUN: <5 mg/dL — ABNORMAL LOW (ref 6–20)
CO2: 19 mmol/L — ABNORMAL LOW (ref 22–32)
Calcium: 8.8 mg/dL — ABNORMAL LOW (ref 8.9–10.3)
Chloride: 107 mmol/L (ref 98–111)
Creatinine, Ser: 0.54 mg/dL (ref 0.44–1.00)
GFR, Estimated: >60 mL/min (ref >60)
Glucose, Bld: 110 mg/dL — ABNORMAL HIGH (ref 70–99)
Potassium: 3.3 mmol/L — ABNORMAL LOW (ref 3.5–5.1)
Sodium: 136 mmol/L (ref 135–145)
Total Bilirubin: 0.3 mg/dL (ref 0.0–1.2)
Total Protein: 5.6 g/dL — ABNORMAL LOW (ref 6.5–8.1)

## 2023-05-06 LAB — URINALYSIS, ROUTINE W REFLEX MICROSCOPIC
Bilirubin Urine: NEGATIVE
Glucose, UA: 50 mg/dL — AB
Hgb urine dipstick: NEGATIVE
Ketones, ur: NEGATIVE mg/dL
Nitrite: NEGATIVE
Protein, ur: 30 mg/dL — AB
Specific Gravity, Urine: 1.024 (ref 1.005–1.030)
pH: 6 (ref 5.0–8.0)

## 2023-05-06 LAB — CBC WITH DIFFERENTIAL/PLATELET
Abs Immature Granulocytes: 0.1 10*3/uL — ABNORMAL HIGH (ref 0.00–0.07)
Basophils Absolute: 0 10*3/uL (ref 0.0–0.1)
Basophils Relative: 1 %
Eosinophils Absolute: 0.2 10*3/uL (ref 0.0–0.5)
Eosinophils Relative: 3 %
HCT: 30.1 % — ABNORMAL LOW (ref 36.0–46.0)
Hemoglobin: 9.7 g/dL — ABNORMAL LOW (ref 12.0–15.0)
Immature Granulocytes: 2 %
Lymphocytes Relative: 16 %
Lymphs Abs: 1.1 10*3/uL (ref 0.7–4.0)
MCH: 27 pg (ref 26.0–34.0)
MCHC: 32.2 g/dL (ref 30.0–36.0)
MCV: 83.8 fL (ref 80.0–100.0)
Monocytes Absolute: 0.9 10*3/uL (ref 0.1–1.0)
Monocytes Relative: 13 %
Neutro Abs: 4.2 10*3/uL (ref 1.7–7.7)
Neutrophils Relative %: 65 %
Platelets: 198 10*3/uL (ref 150–400)
RBC: 3.59 MIL/uL — ABNORMAL LOW (ref 3.87–5.11)
WBC: 6.4 10*3/uL (ref 4.0–10.5)
nRBC: 0 % (ref 0.0–0.2)

## 2023-05-06 LAB — PROTEIN / CREATININE RATIO, URINE
Creatinine, Urine: 174 mg/dL
Protein Creatinine Ratio: 0.16 mg/mg{creat} — ABNORMAL HIGH (ref 0.00–0.15)
Total Protein, Urine: 28 mg/dL

## 2023-05-06 MED ORDER — POTASSIUM CHLORIDE 20 MEQ PO PACK
40.0000 meq | PACK | Freq: Once | ORAL | Status: AC
  Administered 2023-05-06: 40 meq via ORAL
  Filled 2023-05-06: qty 2

## 2023-05-06 MED ORDER — CEFADROXIL 500 MG PO CAPS
500.0000 mg | ORAL_CAPSULE | Freq: Two times a day (BID) | ORAL | 0 refills | Status: AC
  Filled 2023-05-06: qty 14, 7d supply, fill #0

## 2023-05-06 MED ORDER — ACETAMINOPHEN-CAFFEINE 500-65 MG PO TABS
2.0000 | ORAL_TABLET | Freq: Once | ORAL | Status: AC
  Administered 2023-05-06: 2 via ORAL
  Filled 2023-05-06: qty 2

## 2023-05-06 NOTE — MAU Provider Note (Signed)
 HBP, Dizziness    S [name] is a 33 y.o. G62P1001 pregnant female at [redacted]w[redacted]d who presents to MAU today with complaint of HBP and dizziness.   Dizziness = started Saturday after having two diarrhea stools 05/04/23, only mild, however worsened today  HBP = no HTN, felt pressure at top of head that started ~1630, took BP at 1700 and was 132/85.  Denies change in vision, RUQ pain.  Endorses some cramping. Hasn't taken any medications. Called provider at [facility] at 1700 and was instructed to come in for assessment.  Pertinent items noted in HPI and remainder of comprehensive ROS otherwise negative.   O BP 110/61   Pulse 96   Temp 98 F (36.7 C) (Oral)   Resp 16   Ht 5' 6 (1.676 m)   Wt 84.9 kg   LMP 12/30/2021 (Exact Date)   SpO2 99%   BMI 30.20 kg/m  Physical Exam Vitals and nursing note reviewed.  Constitutional:      General: She is not in acute distress.    Appearance: She is well-developed. She is not ill-appearing.  HENT:     Head: Normocephalic and atraumatic.     Mouth/Throat:     Mouth: Mucous membranes are moist.     Pharynx: Oropharynx is clear.  Eyes:     Extraocular Movements: Extraocular movements intact.  Cardiovascular:     Rate and Rhythm: Normal rate.     Heart sounds: No murmur heard.    No friction rub. No gallop.  Pulmonary:     Effort: Pulmonary effort is normal. No respiratory distress.     Breath sounds: Normal breath sounds.  Abdominal:     Comments: gravid  Musculoskeletal:        General: Normal range of motion.     Cervical back: Normal range of motion.  Skin:    General: Skin is warm and dry.  Neurological:     Mental Status: She is alert and oriented to person, place, and time. Mental status is at baseline.  Psychiatric:        Mood and Affect: Mood normal.        Speech: Speech normal.        Behavior: Behavior normal.    NST: 130bpm, moderate variability, + accels, no decels, no ctx   MDM: MAU Course:  Orthostatic  vitals negative  CBC diff hgb up to 9.7 (pt states was 8.4 prior to iron  infusions OP), no leukocytosis CMP K 3.3, normal LFTs, normal Cr  UPC 0.16 UA cloudy, moderate LE, many bacteria, sent for OB Ucx   Ordered 40meq of K. Pt tolerated.  HA resolved with one time dose of Excedrin Tension. Stable for d/c.   A&P: #[redacted] weeks gestation #Suspected acute cystitis w/o hematuria - sent Duricef script - f/u OB Ucx   Discharge from MAU in stable condition with strict / usual precautions Follow up at [facility] as scheduled for ongoing prenatal care  Allergies as of 05/06/2023       Reactions   Penicillins Other (See Comments)   Reaction occurred as a child and pt is uncertain what the reaction was        Medication List     TAKE these medications    B-D 3CC LUER-LOK SYR 18GX1-1/2 18G X 1-1/2 3 ML Misc Generic drug: SYRINGE-NEEDLE (DISP) 3 ML Use to draw out Progesterone    cefadroxil  500 MG capsule Commonly known as: DURICEF Take 1 capsule (500 mg total)  by mouth 2 (two) times daily for 7 days.   Easy Touch Hypodermic Needle 22G X 1-1/2 Misc Generic drug: NEEDLE (DISP) 22 G Use to inject progesterone    IRON  PO Take 1 tablet by mouth daily.   ondansetron  8 MG tablet Commonly known as: ZOFRAN  Take 1 tablet (8 mg total) by mouth 3 (three) times daily as needed.   progesterone  200 MG capsule Commonly known as: Prometrium  Take 1 capsule (200 mg total) by mouth at bedtime.   sertraline  50 MG tablet Commonly known as: ZOLOFT  Take 1 tablet (50 mg total) by mouth daily.        Valerie Augustin BROCKS, MD 05/06/2023 8:36 PM

## 2023-05-06 NOTE — MAU Note (Addendum)
 Pt says she feels dizzy- started Sat- not bad.  Today worse  Also feels pressure in top of head - started 430pm. She checked BP at 5pm- 132/85. No problems with BP  No vision problems .  No epigastric pain  Feels some cramping  She called Dr at 5pm-   No meds taken . [facility]- Landy Stains and MFM- IVF

## 2023-05-07 ENCOUNTER — Other Ambulatory Visit (HOSPITAL_COMMUNITY): Payer: Self-pay

## 2023-05-07 LAB — CULTURE, OB URINE: Special Requests: NORMAL

## 2023-05-24 ENCOUNTER — Other Ambulatory Visit: Payer: Self-pay | Admitting: *Deleted

## 2023-05-24 ENCOUNTER — Ambulatory Visit: Attending: Maternal & Fetal Medicine

## 2023-05-24 ENCOUNTER — Other Ambulatory Visit: Payer: Self-pay

## 2023-05-24 DIAGNOSIS — O34219 Maternal care for unspecified type scar from previous cesarean delivery: Secondary | ICD-10-CM | POA: Diagnosis not present

## 2023-05-24 DIAGNOSIS — O09813 Supervision of pregnancy resulting from assisted reproductive technology, third trimester: Secondary | ICD-10-CM

## 2023-05-24 DIAGNOSIS — O283 Abnormal ultrasonic finding on antenatal screening of mother: Secondary | ICD-10-CM | POA: Insufficient documentation

## 2023-05-24 DIAGNOSIS — Z3A36 36 weeks gestation of pregnancy: Secondary | ICD-10-CM

## 2023-05-28 NOTE — Patient Instructions (Signed)
 Valerie  Graham  05/28/2023   Your procedure is scheduled on:  06/11/2023  Arrive at 1000 at Entrance C on [address] [facility]  and [address]. You are invited to use the FREE valet parking or use the Visitor's parking deck.  Pick up the phone at the desk and dial [phone].  Call this number if you have problems the morning of surgery: [phone]  Remember:   Do not eat food:(After Midnight) Desps de medianoche.  You may drink clear liquids until arrival at __1000___.  Clear liquids means a liquid you can see thru.  It can have color such as Cola or Kool aid.  Tea is OK and coffee as long as no milk or creamer of any kind.  Take these medicines the morning of surgery with A SIP OF WATER :  Take zoloft  as prescribed   Do not wear jewelry, make-up or nail polish.  Do not wear lotions, powders, or perfumes. Do not wear deodorant.  Do not shave 48 hours prior to surgery.  Do not bring valuables to the hospital.  [facility] is not   responsible for any belongings or valuables brought to the hospital.  Contacts, dentures or bridgework may not be worn into surgery.  Leave suitcase in the car. After surgery it may be brought to your room.  For patients admitted to the hospital, checkout time is 11:00 AM the day of              discharge.      Please read over the following fact sheets that you were given:     Preparing for Surgery

## 2023-05-29 ENCOUNTER — Telehealth (HOSPITAL_COMMUNITY): Payer: Self-pay | Admitting: *Deleted

## 2023-05-29 ENCOUNTER — Encounter (HOSPITAL_COMMUNITY): Payer: Self-pay

## 2023-05-29 NOTE — Telephone Encounter (Signed)
Preadmission screen

## 2023-05-31 ENCOUNTER — Ambulatory Visit: Payer: 59

## 2023-06-02 ENCOUNTER — Encounter (HOSPITAL_COMMUNITY): Payer: Self-pay | Admitting: Obstetrics and Gynecology

## 2023-06-02 ENCOUNTER — Inpatient Hospital Stay (HOSPITAL_COMMUNITY)
Admission: AD | Admit: 2023-06-02 | Discharge: 2023-06-02 | Disposition: A | Discharge status: Home / Self Care | Attending: Obstetrics and Gynecology | Admitting: Obstetrics and Gynecology

## 2023-06-02 DIAGNOSIS — R0981 Nasal congestion: Secondary | ICD-10-CM | POA: Diagnosis not present

## 2023-06-02 DIAGNOSIS — R6889 Other general symptoms and signs: Secondary | ICD-10-CM

## 2023-06-02 DIAGNOSIS — O99513 Diseases of the respiratory system complicating pregnancy, third trimester: Secondary | ICD-10-CM | POA: Diagnosis present

## 2023-06-02 DIAGNOSIS — O26893 Other specified pregnancy related conditions, third trimester: Secondary | ICD-10-CM | POA: Diagnosis not present

## 2023-06-02 DIAGNOSIS — Z3A38 38 weeks gestation of pregnancy: Secondary | ICD-10-CM | POA: Diagnosis not present

## 2023-06-02 DIAGNOSIS — Z1152 Encounter for screening for COVID-19: Secondary | ICD-10-CM | POA: Diagnosis not present

## 2023-06-02 LAB — RESPIRATORY PANEL BY PCR

## 2023-06-02 LAB — CBC WITH DIFFERENTIAL/PLATELET
Abs Immature Granulocytes: 0 10*3/uL (ref 0.00–0.07)
Basophils Absolute: 0.3 10*3/uL — ABNORMAL HIGH (ref 0.0–0.1)
Basophils Relative: 3 %
Eosinophils Absolute: 0.4 10*3/uL (ref 0.0–0.5)
Eosinophils Relative: 4 %
HCT: 34.7 % — ABNORMAL LOW (ref 36.0–46.0)
Hemoglobin: 11.5 g/dL — ABNORMAL LOW (ref 12.0–15.0)
Lymphocytes Relative: 17 %
Lymphs Abs: 1.6 10*3/uL (ref 0.7–4.0)
MCH: 28.4 pg (ref 26.0–34.0)
MCHC: 33.1 g/dL (ref 30.0–36.0)
MCV: 85.7 fL (ref 80.0–100.0)
Monocytes Absolute: 0.5 10*3/uL (ref 0.1–1.0)
Monocytes Relative: 5 %
Neutro Abs: 6.7 10*3/uL (ref 1.7–7.7)
Neutrophils Relative %: 71 %
Platelets: 210 10*3/uL (ref 150–400)
RBC: 4.05 MIL/uL (ref 3.87–5.11)
RDW: 21.8 % — ABNORMAL HIGH (ref 11.5–15.5)
WBC: 9.4 10*3/uL (ref 4.0–10.5)
nRBC: 0 % (ref 0.0–0.2)
nRBC: 0 /100{WBCs}

## 2023-06-02 LAB — SARS CORONAVIRUS 2 BY RT PCR: SARS Coronavirus 2 by RT PCR: NEGATIVE

## 2023-06-02 MED ORDER — IPRATROPIUM-ALBUTEROL 0.5-2.5 (3) MG/3ML IN SOLN
3.0000 mL | Freq: Once | RESPIRATORY_TRACT | Status: AC
  Administered 2023-06-02: 3 mL via RESPIRATORY_TRACT
  Filled 2023-06-02: qty 3

## 2023-06-02 MED ORDER — ALBUTEROL SULFATE HFA 108 (90 BASE) MCG/ACT IN AERS
1.0000 | INHALATION_SPRAY | Freq: Four times a day (QID) | RESPIRATORY_TRACT | 0 refills | Status: AC | PRN
  Filled 2023-06-02: qty 18, 25d supply, fill #0

## 2023-06-02 NOTE — MAU Provider Note (Signed)
History     CSN: [id number]  Arrival date and time: 06/02/23 1709   Event Date/Time   First Provider Initiated Contact with Patient 06/02/23 1820      Chief Complaint  Patient presents with   Headache   Cough   Nasal Congestion   Valerie Graham , a  33 y.o. G2P1001 at [redacted]w[redacted]d presents to MAU with complaints of HA, cough, and congestion. Patient states that starting Wednesday she began having a cough and was recommended to start taking Robitussin. She reports no relief and states that she has actually started to feel worse. She states that her HA is mainly on the right side and currently a 5/10 and denies attempting to relieve symptoms. She states that she is also having a productive cough and chest tightness. She notes a hx of asthma in a previous pregnancy but has used an inhaler since.Reports that she was very "wheezy" last night and only felt better with sitting up.  Reports a sore throat from coughing and drainage. Her son has RSV 2 weeks ago. Denies fever chills, nausea and vomiting, LOF VB and contractions. Endorses positive fetal movement.          OB History     Gravida  2   Para  1   Term  1   Preterm  0   AB  0   Living  1      SAB  0   IAB  0   Ectopic  0   Multiple  0   Live Births  1           Past Medical History:  Diagnosis Date   Anxiety    Chicken pox    Closed fracture of left distal radius 07/11/2022   Depression    Family history of ovarian cancer    Family history of prostate cancer    Family history of skin cancer    Ganglion cyst of dorsum of left wrist 07/11/2022   Herpes simplex without mention of complication 07/02/2011   Tendinitis of extensor tendon of left hand 07/11/2022    Past Surgical History:  Procedure Laterality Date   CESAREAN SECTION N/A 07/19/2021   Procedure: CESAREAN SECTION;  Surgeon: Edwinna Areola, DO;  Location: [facility] LD [facility]S;  Service: Obstetrics;  Laterality: N/A;   TONSILLECTOMY  2014   WISDOM  TOOTH EXTRACTION      Family History  Problem Relation Age of Onset   Cancer Paternal Grandfather    Cancer Maternal Grandmother 57       breast   Cancer Father 17       bladder/prostate metastatic, neg genetic testing 2019   Endometriosis Mother 78       hysterectomy   Diabetes Maternal Aunt    Cancer Paternal Uncle 30       prostate   Stroke Paternal Uncle    Cancer Paternal Aunt        ovarian   Cancer Other        colon cancer    Social History   Tobacco Use   Smoking status: Never   Smokeless tobacco: Never  Vaping Use   Vaping status: Never Used  Substance Use Topics   Alcohol use: Not Currently   Drug use: No    Allergies:  Allergies  Allergen Reactions   Penicillins Other (See Comments)    Reaction occurred as a child and pt is uncertain what the reaction was    Medications Prior to  Admission  Medication Sig Dispense Refill Last Dose/Taking   famotidine (PEPCID) 40 MG tablet Take 40 mg by mouth daily.   06/02/2023 at  4:30 PM   guaifenesin (ROBITUSSIN) 100 MG/5ML syrup Take 200 mg by mouth 3 (three) times daily as needed for cough.   06/01/2023   Ferrous Sulfate (IRON PO) Take 1 tablet by mouth daily.      NEEDLE, DISP, 22 G 22G X 1-1/2" MISC Use to inject progesterone 30 each 3    ondansetron (ZOFRAN) 8 MG tablet Take 1 tablet (8 mg total) by mouth 3 (three) times daily as needed. 30 tablet 2    progesterone (PROMETRIUM) 200 MG capsule Take 1 capsule (200 mg total) by mouth at bedtime. 14 capsule 0    sertraline (ZOLOFT) 50 MG tablet Take 1 tablet (50 mg total) by mouth daily. 90 tablet 3    SYRINGE-NEEDLE, DISP, 3 ML (B-D 3CC LUER-LOK SYR 18GX1-1/2) 18G X 1-1/2" 3 ML MISC Use to draw out Progesterone 30 each 3     Review of Systems  Constitutional:  Negative for chills, fatigue and fever.  HENT:  Positive for congestion, postnasal drip, rhinorrhea, sneezing and sore throat. Negative for sinus pressure and sinus pain.   Eyes:  Negative for pain and visual  disturbance.  Respiratory:  Positive for cough, chest tightness, shortness of breath and wheezing. Negative for apnea.   Cardiovascular:  Negative for palpitations.  Gastrointestinal:  Negative for abdominal pain, constipation, diarrhea, nausea and vomiting.  Genitourinary:  Negative for difficulty urinating, dysuria, pelvic pain, vaginal bleeding, vaginal discharge and vaginal pain.  Musculoskeletal:  Negative for back pain.  Neurological:  Positive for headaches. Negative for seizures and weakness.  Psychiatric/Behavioral:  Negative for suicidal ideas.    Physical Exam   Blood pressure 107/76, pulse 92, temperature 97.8 F (36.6 C), temperature source Oral, resp. rate (!) 22, height 5\' 6"  (1.676 m), weight 85.2 kg, last menstrual period 12/30/2021, SpO2 99%, not currently breastfeeding.  Physical Exam Vitals and nursing note reviewed.  Constitutional:      General: She is not in acute distress.    Appearance: Normal appearance. She is ill-appearing.  HENT:     Head: Normocephalic.  Pulmonary:     Effort: Tachypnea present.     Breath sounds: Examination of the right-upper field reveals wheezing. Examination of the left-upper field reveals wheezing. Wheezing present.     Comments: Notable wheezing on inspiration but after coughing Lung sounds clear bilaterally  Abdominal:     Palpations: Abdomen is soft.  Musculoskeletal:     Cervical back: Normal range of motion.  Skin:    General: Skin is warm and dry.  Neurological:     Mental Status: She is alert and oriented to person, place, and time.  Psychiatric:        Mood and Affect: Mood normal.    FHT: 150bpm with moderate variability 15x15 accels no decels.  Toco:Quiet   MAU Course  Procedures Orders Placed This Encounter  Procedures   Respiratory (~20 pathogens) panel by PCR   SARS Coronavirus 2 by RT PCR (hospital order, performed in [facility] hospital lab) *cepheid single result test* Anterior Nasal Swab   CBC with  Differential/Platelet   Airborne and Contact precautions   Meds ordered this encounter  Medications   ipratropium-albuterol (DUONEB) 0.5-2.5 (3) MG/3ML nebulizer solution 3 mL  No results found for this or any previous visit (from the past 24 hours). Results for orders placed or performed  during the hospital encounter of 06/02/23 (from the past 48 hours)  Respiratory (~20 pathogens) panel by PCR     Status: Abnormal   Collection Time: 06/02/23  5:53 PM   Specimen: Nasopharyngeal Swab; Respiratory  Result Value Ref Range   Adenovirus NOT DETECTED NOT DETECTED   Coronavirus 229E NOT DETECTED NOT DETECTED    Comment: (NOTE) The Coronavirus on the Respiratory Panel, DOES NOT test for the novel  Coronavirus (2019 nCoV)    Coronavirus HKU1 NOT DETECTED NOT DETECTED   Coronavirus NL63 NOT DETECTED NOT DETECTED   Coronavirus OC43 NOT DETECTED NOT DETECTED   Metapneumovirus DETECTED (A) NOT DETECTED   Rhinovirus / Enterovirus NOT DETECTED NOT DETECTED   Influenza A NOT DETECTED NOT DETECTED   Influenza B NOT DETECTED NOT DETECTED   Parainfluenza Virus 1 NOT DETECTED NOT DETECTED   Parainfluenza Virus 2 NOT DETECTED NOT DETECTED   Parainfluenza Virus 3 NOT DETECTED NOT DETECTED   Parainfluenza Virus 4 NOT DETECTED NOT DETECTED   Respiratory Syncytial Virus NOT DETECTED NOT DETECTED   Bordetella pertussis NOT DETECTED NOT DETECTED   Bordetella Parapertussis NOT DETECTED NOT DETECTED   Chlamydophila pneumoniae NOT DETECTED NOT DETECTED   Mycoplasma pneumoniae NOT DETECTED NOT DETECTED    Comment: Performed at [facility], [address], [place], [place] [address]  SARS Coronavirus 2 by RT PCR (hospital order, performed in [facility] hospital lab) *cepheid single result test* Anterior Nasal Swab     Status: None   Collection Time: 06/02/23  5:53 PM   Specimen: Anterior Nasal Swab  Result Value Ref Range   SARS Coronavirus 2 by RT PCR NEGATIVE NEGATIVE    Comment: Performed at  [facility], [address] [place], [place] [address]  CBC with Differential/Platelet     Status: Abnormal   Collection Time: 06/02/23  7:24 PM  Result Value Ref Range   WBC 9.4 4.0 - 10.5 K/uL   RBC 4.05 3.87 - 5.11 MIL/uL   Hemoglobin 11.5 (L) 12.0 - 15.0 g/dL   HCT 30.8 (L) 65.7 - 84.6 %   MCV 85.7 80.0 - 100.0 fL   MCH 28.4 26.0 - 34.0 pg   MCHC 33.1 30.0 - 36.0 g/dL   RDW 96.2 (H) 95.2 - 84.1 %   Platelets 210 150 - 400 K/uL   nRBC 0.0 0.0 - 0.2 %   Neutrophils Relative % 71 %   Neutro Abs 6.7 1.7 - 7.7 K/uL   Lymphocytes Relative 17 %   Lymphs Abs 1.6 0.7 - 4.0 K/uL   Monocytes Relative 5 %   Monocytes Absolute 0.5 0.1 - 1.0 K/uL   Eosinophils Relative 4 %   Eosinophils Absolute 0.4 0.0 - 0.5 K/uL   Basophils Relative 3 %   Basophils Absolute 0.3 (H) 0.0 - 0.1 K/uL   RBC Morphology See Note     Comment: Morphology unremarkable   Smear Review See Note     Comment: Normal Platelet Morphology   nRBC 0 0 /100 WBC   Abs Immature Granulocytes 0.00 0.00 - 0.07 K/uL    Comment: Performed at [facility], [address], [place], [place] [address]     MDM - Resp Panel ordered.  - 02 sats normal but given hx duoneb tx ordered.  - Offered pain management for HA but patient declined.  - FHT appropriate for gestational age.  - Negative for COVID  - Patient reports feeling a little better after  DuoNeb tx.  - Resp Panel positive for Metapneumovirus  - Plan for discharge.   Assessment and Plan   1. Flu-like symptoms   2. [redacted] weeks gestation of pregnancy   3. Nasal congestion    - Reviewed dx of Viral Pneumonia in pregnancy.  - Rx for Albuterol inhaler sent to outpatient pharmacy for pick up.  - Encouraged rest and increase fluid intake.  - Reviewed worsening signs and return precautions.  - FHT appropriate for gestational age at time of discharge.  - Patient discharged home in stable condition and may return to MAU as needed.   Claudette Head, MSN CNM   06/02/2023, 6:20 PM

## 2023-06-02 NOTE — MAU Note (Signed)
[name] is a 34 y.o. at [redacted]w[redacted]d here in MAU reporting: started with congestion on Wed.  Now has runny nose, HA and productive cough.  Denies fever or body aches. Son had RSV a couple wks ago. No one else is sick right now. Saw dr on Wed, has been taking Robitussin, hasn't had any today. No Tylenol.  No bleeding or LOF, reports +FM. Onset of complaint: Wed Pain score: 4 Vitals:   06/02/23 1725 06/02/23 1730  BP:  114/74  Pulse:  88  Resp:  (!) 22  Temp:  97.8 F (36.6 C)  SpO2: 99% 99%     FHT:147 Lab orders placed from triage:     Resp seemed labored when HOB lowered to listen to baby

## 2023-06-04 ENCOUNTER — Other Ambulatory Visit (HOSPITAL_COMMUNITY): Payer: Self-pay

## 2023-06-06 ENCOUNTER — Encounter (HOSPITAL_COMMUNITY): Payer: Self-pay | Admitting: Student

## 2023-06-06 ENCOUNTER — Inpatient Hospital Stay (HOSPITAL_COMMUNITY): Admitting: Certified Registered"

## 2023-06-06 ENCOUNTER — Inpatient Hospital Stay (HOSPITAL_COMMUNITY)
Admission: AD | Admit: 2023-06-06 | Discharge: 2023-06-09 | DRG: 786 | Disposition: A | Discharge status: Home / Self Care | Attending: Obstetrics and Gynecology | Admitting: Obstetrics and Gynecology

## 2023-06-06 ENCOUNTER — Other Ambulatory Visit: Payer: Self-pay

## 2023-06-06 ENCOUNTER — Ambulatory Visit

## 2023-06-06 ENCOUNTER — Encounter (HOSPITAL_COMMUNITY)
Admission: AD | Disposition: A | Discharge status: Home / Self Care | Payer: Self-pay | Source: Home / Self Care | Attending: Obstetrics and Gynecology

## 2023-06-06 DIAGNOSIS — O9081 Anemia of the puerperium: Secondary | ICD-10-CM | POA: Diagnosis not present

## 2023-06-06 DIAGNOSIS — Z3A38 38 weeks gestation of pregnancy: Secondary | ICD-10-CM

## 2023-06-06 DIAGNOSIS — F419 Anxiety disorder, unspecified: Secondary | ICD-10-CM | POA: Diagnosis present

## 2023-06-06 DIAGNOSIS — O99344 Other mental disorders complicating childbirth: Secondary | ICD-10-CM | POA: Diagnosis present

## 2023-06-06 DIAGNOSIS — Z79899 Other long term (current) drug therapy: Secondary | ICD-10-CM

## 2023-06-06 DIAGNOSIS — D62 Acute posthemorrhagic anemia: Secondary | ICD-10-CM | POA: Diagnosis not present

## 2023-06-06 DIAGNOSIS — O9952 Diseases of the respiratory system complicating childbirth: Secondary | ICD-10-CM | POA: Diagnosis present

## 2023-06-06 DIAGNOSIS — O4202 Full-term premature rupture of membranes, onset of labor within 24 hours of rupture: Secondary | ICD-10-CM | POA: Diagnosis not present

## 2023-06-06 DIAGNOSIS — Z98891 History of uterine scar from previous surgery: Principal | ICD-10-CM

## 2023-06-06 DIAGNOSIS — J129 Viral pneumonia, unspecified: Secondary | ICD-10-CM | POA: Diagnosis present

## 2023-06-06 DIAGNOSIS — O26893 Other specified pregnancy related conditions, third trimester: Secondary | ICD-10-CM | POA: Diagnosis present

## 2023-06-06 DIAGNOSIS — O34211 Maternal care for low transverse scar from previous cesarean delivery: Principal | ICD-10-CM | POA: Diagnosis present

## 2023-06-06 DIAGNOSIS — Z833 Family history of diabetes mellitus: Secondary | ICD-10-CM | POA: Diagnosis not present

## 2023-06-06 DIAGNOSIS — F32A Depression, unspecified: Secondary | ICD-10-CM | POA: Diagnosis present

## 2023-06-06 DIAGNOSIS — F411 Generalized anxiety disorder: Secondary | ICD-10-CM

## 2023-06-06 LAB — TYPE AND SCREEN
ABO/RH(D): O POS
Antibody Screen: NEGATIVE

## 2023-06-06 LAB — CBC
HCT: 35.2 % — ABNORMAL LOW (ref 36.0–46.0)
Hemoglobin: 11.8 g/dL — ABNORMAL LOW (ref 12.0–15.0)
MCH: 28.6 pg (ref 26.0–34.0)
MCHC: 33.5 g/dL (ref 30.0–36.0)
MCV: 85.2 fL (ref 80.0–100.0)
Platelets: 227 10*3/uL (ref 150–400)
RBC: 4.13 MIL/uL (ref 3.87–5.11)
RDW: 21.1 % — ABNORMAL HIGH (ref 11.5–15.5)
WBC: 9.4 10*3/uL (ref 4.0–10.5)
nRBC: 0 % (ref 0.0–0.2)

## 2023-06-06 LAB — POCT FERN TEST: POCT Fern Test: POSITIVE

## 2023-06-06 LAB — RPR
RPR Ser Ql: REACTIVE — AB
RPR Titer: 1:4 {titer}

## 2023-06-06 SURGERY — Surgical Case
Anesthesia: Spinal | Site: Abdomen

## 2023-06-06 MED ORDER — PRENATAL MULTIVITAMIN CH
1.0000 | ORAL_TABLET | Freq: Every day | ORAL | Status: DC
  Administered 2023-06-06 – 2023-06-09 (×4): 1 via ORAL
  Filled 2023-06-06 (×4): qty 1

## 2023-06-06 MED ORDER — STERILE WATER FOR IRRIGATION IR SOLN
Status: DC | PRN
  Administered 2023-06-06 (×2): 1000 mL

## 2023-06-06 MED ORDER — ONDANSETRON HCL 4 MG/2ML IJ SOLN
INTRAMUSCULAR | Status: DC | PRN
  Administered 2023-06-06: 4 mg via INTRAVENOUS

## 2023-06-06 MED ORDER — KETOROLAC TROMETHAMINE 30 MG/ML IJ SOLN: 30.0000 mg | Freq: Four times a day (QID) | INTRAMUSCULAR | Status: AC | PRN

## 2023-06-06 MED ORDER — ALBUTEROL SULFATE (2.5 MG/3ML) 0.083% IN NEBU
2.5000 mg | INHALATION_SOLUTION | Freq: Four times a day (QID) | RESPIRATORY_TRACT | Status: DC | PRN
  Administered 2023-06-08: 2.5 mg via RESPIRATORY_TRACT
  Filled 2023-06-06: qty 3

## 2023-06-06 MED ORDER — COCONUT OIL OIL: 1.0000 | TOPICAL_OIL | Status: DC | PRN

## 2023-06-06 MED ORDER — SODIUM CHLORIDE 0.9 % IR SOLN
Status: DC | PRN
  Administered 2023-06-06 (×2): 1

## 2023-06-06 MED ORDER — DIPHENHYDRAMINE HCL 50 MG/ML IJ SOLN: 12.5000 mg | INTRAMUSCULAR | Status: DC | PRN

## 2023-06-06 MED ORDER — SCOPOLAMINE 1 MG/3DAYS TD PT72
MEDICATED_PATCH | TRANSDERMAL | Status: DC | PRN
  Administered 2023-06-06: 1 via TRANSDERMAL

## 2023-06-06 MED ORDER — DIPHENHYDRAMINE HCL 25 MG PO CAPS: 25.0000 mg | ORAL_CAPSULE | Freq: Four times a day (QID) | ORAL | Status: DC | PRN

## 2023-06-06 MED ORDER — KETOROLAC TROMETHAMINE 30 MG/ML IJ SOLN
30.0000 mg | Freq: Four times a day (QID) | INTRAMUSCULAR | Status: AC | PRN
  Administered 2023-06-06: 30 mg via INTRAVENOUS

## 2023-06-06 MED ORDER — SODIUM CHLORIDE 0.9 % IV SOLN
500.0000 mg | INTRAVENOUS | Status: AC
  Administered 2023-06-06: 500 mg via INTRAVENOUS

## 2023-06-06 MED ORDER — LACTATED RINGERS IV SOLN: INTRAVENOUS | Status: DC

## 2023-06-06 MED ORDER — OXYTOCIN-SODIUM CHLORIDE 30-0.9 UT/500ML-% IV SOLN: 2.5000 [IU]/h | INTRAVENOUS | Status: AC

## 2023-06-06 MED ORDER — DIPHENHYDRAMINE HCL 25 MG PO CAPS: 25.0000 mg | ORAL_CAPSULE | ORAL | Status: DC | PRN

## 2023-06-06 MED ORDER — KETOROLAC TROMETHAMINE 30 MG/ML IJ SOLN
INTRAMUSCULAR | Status: AC
Start: 2023-06-06 — End: ?
  Filled 2023-06-06: qty 1

## 2023-06-06 MED ORDER — SENNOSIDES-DOCUSATE SODIUM 8.6-50 MG PO TABS
2.0000 | ORAL_TABLET | Freq: Every day | ORAL | Status: DC
  Administered 2023-06-07 – 2023-06-09 (×3): 2 via ORAL
  Filled 2023-06-06 (×3): qty 2

## 2023-06-06 MED ORDER — SIMETHICONE 80 MG PO CHEW: 80.0000 mg | CHEWABLE_TABLET | ORAL | Status: DC | PRN

## 2023-06-06 MED ORDER — ACETAMINOPHEN 10 MG/ML IV SOLN
INTRAVENOUS | Status: AC
Start: 2023-06-06 — End: ?
  Filled 2023-06-06: qty 100

## 2023-06-06 MED ORDER — NALOXONE HCL 4 MG/10ML IJ SOLN: 1.0000 ug/kg/h | INTRAVENOUS | Status: DC | PRN

## 2023-06-06 MED ORDER — SCOPOLAMINE 1 MG/3DAYS TD PT72: 1.0000 | MEDICATED_PATCH | Freq: Once | TRANSDERMAL | Status: DC

## 2023-06-06 MED ORDER — SODIUM CHLORIDE 0.9 % IV SOLN: INTRAVENOUS | Status: DC | PRN

## 2023-06-06 MED ORDER — NALOXONE HCL 0.4 MG/ML IJ SOLN: 0.4000 mg | INTRAMUSCULAR | Status: DC | PRN

## 2023-06-06 MED ORDER — DEXAMETHASONE SODIUM PHOSPHATE 10 MG/ML IJ SOLN
INTRAMUSCULAR | Status: AC
  Filled 2023-06-06: qty 1

## 2023-06-06 MED ORDER — SODIUM CHLORIDE 0.9% FLUSH: 3.0000 mL | INTRAVENOUS | Status: DC | PRN

## 2023-06-06 MED ORDER — CEFAZOLIN SODIUM-DEXTROSE 2-4 GM/100ML-% IV SOLN
2.0000 g | INTRAVENOUS | Status: AC
  Administered 2023-06-06: 2 g via INTRAVENOUS

## 2023-06-06 MED ORDER — ONDANSETRON HCL 4 MG/2ML IJ SOLN
INTRAMUSCULAR | Status: AC
  Filled 2023-06-06: qty 2

## 2023-06-06 MED ORDER — PHENYLEPHRINE HCL-NACL 20-0.9 MG/250ML-% IV SOLN
INTRAVENOUS | Status: DC | PRN
  Administered 2023-06-06: 60 ug/min via INTRAVENOUS

## 2023-06-06 MED ORDER — SOD CITRATE-CITRIC ACID 500-334 MG/5ML PO SOLN
30.0000 mL | ORAL | Status: AC
  Administered 2023-06-06: 30 mL via ORAL
  Filled 2023-06-06: qty 30

## 2023-06-06 MED ORDER — ACETAMINOPHEN 500 MG PO TABS: 1000.0000 mg | ORAL_TABLET | Freq: Four times a day (QID) | ORAL | Status: DC

## 2023-06-06 MED ORDER — MORPHINE SULFATE (PF) 0.5 MG/ML IJ SOLN
INTRAMUSCULAR | Status: AC
  Filled 2023-06-06: qty 10

## 2023-06-06 MED ORDER — SIMETHICONE 80 MG PO CHEW
80.0000 mg | CHEWABLE_TABLET | Freq: Three times a day (TID) | ORAL | Status: DC
  Administered 2023-06-06 – 2023-06-09 (×10): 80 mg via ORAL
  Filled 2023-06-06 (×10): qty 1

## 2023-06-06 MED ORDER — MENTHOL 3 MG MT LOZG: 1.0000 | LOZENGE | OROMUCOSAL | Status: DC | PRN

## 2023-06-06 MED ORDER — FENTANYL CITRATE (PF) 100 MCG/2ML IJ SOLN
INTRAMUSCULAR | Status: AC
  Filled 2023-06-06: qty 2

## 2023-06-06 MED ORDER — OXYCODONE HCL 5 MG PO TABS
5.0000 mg | ORAL_TABLET | ORAL | Status: DC | PRN
Start: 2023-06-06 — End: 2023-06-09
  Administered 2023-06-07 – 2023-06-09 (×4): 10 mg via ORAL
  Filled 2023-06-06 (×4): qty 2

## 2023-06-06 MED ORDER — DIBUCAINE (PERIANAL) 1 % EX OINT: 1.0000 | TOPICAL_OINTMENT | CUTANEOUS | Status: DC | PRN

## 2023-06-06 MED ORDER — LACTATED RINGERS IV SOLN: INTRAVENOUS | Status: DC | PRN

## 2023-06-06 MED ORDER — ACETAMINOPHEN 500 MG PO TABS
1000.0000 mg | ORAL_TABLET | Freq: Four times a day (QID) | ORAL | Status: DC
  Administered 2023-06-06 – 2023-06-09 (×12): 1000 mg via ORAL
  Filled 2023-06-06 (×13): qty 2

## 2023-06-06 MED ORDER — OXYTOCIN-SODIUM CHLORIDE 30-0.9 UT/500ML-% IV SOLN
INTRAVENOUS | Status: DC | PRN
  Administered 2023-06-06: 30 [IU] via INTRAVENOUS

## 2023-06-06 MED ORDER — ACETAMINOPHEN 10 MG/ML IV SOLN
INTRAVENOUS | Status: DC | PRN
  Administered 2023-06-06: 1000 mg via INTRAVENOUS

## 2023-06-06 MED ORDER — MEPERIDINE HCL 25 MG/ML IJ SOLN: 6.2500 mg | INTRAMUSCULAR | Status: DC | PRN

## 2023-06-06 MED ORDER — PHENYLEPHRINE HCL-NACL 20-0.9 MG/250ML-% IV SOLN
INTRAVENOUS | Status: AC
  Filled 2023-06-06: qty 250

## 2023-06-06 MED ORDER — DM-GUAIFENESIN ER 30-600 MG PO TB12
1.0000 | ORAL_TABLET | Freq: Two times a day (BID) | ORAL | Status: DC | PRN
  Administered 2023-06-06 – 2023-06-07 (×2): 1 via ORAL
  Filled 2023-06-06 (×5): qty 1

## 2023-06-06 MED ORDER — WITCH HAZEL-GLYCERIN EX PADS: 1.0000 | MEDICATED_PAD | CUTANEOUS | Status: DC | PRN

## 2023-06-06 MED ORDER — BUPIVACAINE IN DEXTROSE 0.75-8.25 % IT SOLN
INTRATHECAL | Status: DC | PRN
  Administered 2023-06-06: 1.6 mL via INTRATHECAL

## 2023-06-06 MED ORDER — LACTATED RINGERS IV BOLUS
1000.0000 mL | Freq: Once | INTRAVENOUS | Status: AC
  Administered 2023-06-06: 1000 mL via INTRAVENOUS

## 2023-06-06 MED ORDER — SOD CITRATE-CITRIC ACID 500-334 MG/5ML PO SOLN
30.0000 mL | Freq: Once | ORAL | Status: DC
Start: 2023-06-06 — End: 2023-06-06

## 2023-06-06 MED ORDER — SCOPOLAMINE 1 MG/3DAYS TD PT72
MEDICATED_PATCH | TRANSDERMAL | Status: AC
  Filled 2023-06-06: qty 1

## 2023-06-06 MED ORDER — FENTANYL CITRATE (PF) 100 MCG/2ML IJ SOLN: 25.0000 ug | INTRAMUSCULAR | Status: DC | PRN

## 2023-06-06 MED ORDER — SERTRALINE HCL 50 MG PO TABS
50.0000 mg | ORAL_TABLET | Freq: Every day | ORAL | Status: DC
Start: 2023-06-06 — End: 2023-06-09
  Administered 2023-06-06 – 2023-06-09 (×4): 50 mg via ORAL
  Filled 2023-06-06 (×4): qty 1

## 2023-06-06 MED ORDER — GABAPENTIN 100 MG PO CAPS
100.0000 mg | ORAL_CAPSULE | Freq: Two times a day (BID) | ORAL | Status: DC
  Administered 2023-06-06 – 2023-06-09 (×7): 100 mg via ORAL
  Filled 2023-06-06 (×7): qty 1

## 2023-06-06 MED ORDER — FENTANYL CITRATE (PF) 100 MCG/2ML IJ SOLN
INTRAMUSCULAR | Status: DC | PRN
  Administered 2023-06-06: 15 ug via INTRATHECAL

## 2023-06-06 MED ORDER — ONDANSETRON HCL 4 MG/2ML IJ SOLN: 4.0000 mg | Freq: Three times a day (TID) | INTRAMUSCULAR | Status: DC | PRN

## 2023-06-06 MED ORDER — FAMOTIDINE 20 MG PO TABS
40.0000 mg | ORAL_TABLET | Freq: Every day | ORAL | Status: DC
Start: 2023-06-06 — End: 2023-06-09
  Administered 2023-06-06 – 2023-06-09 (×4): 40 mg via ORAL
  Filled 2023-06-06 (×4): qty 2

## 2023-06-06 MED ORDER — DEXAMETHASONE SODIUM PHOSPHATE 10 MG/ML IJ SOLN
INTRAMUSCULAR | Status: DC | PRN
  Administered 2023-06-06: 10 mg via INTRAVENOUS

## 2023-06-06 MED ORDER — SODIUM CHLORIDE 0.9 % IV SOLN: 500.0000 mg | Freq: Once | INTRAVENOUS | Status: DC

## 2023-06-06 MED ORDER — KETOROLAC TROMETHAMINE 30 MG/ML IJ SOLN
30.0000 mg | Freq: Four times a day (QID) | INTRAMUSCULAR | Status: AC
  Administered 2023-06-06 – 2023-06-07 (×4): 30 mg via INTRAVENOUS
  Filled 2023-06-06 (×4): qty 1

## 2023-06-06 MED ORDER — IBUPROFEN 600 MG PO TABS
600.0000 mg | ORAL_TABLET | Freq: Four times a day (QID) | ORAL | Status: DC
  Administered 2023-06-07 – 2023-06-09 (×8): 600 mg via ORAL
  Filled 2023-06-06 (×8): qty 1

## 2023-06-06 MED ORDER — MORPHINE SULFATE (PF) 0.5 MG/ML IJ SOLN
INTRAMUSCULAR | Status: DC | PRN
  Administered 2023-06-06: 150 ug via INTRATHECAL

## 2023-06-06 SURGICAL SUPPLY — 31 items
CHLORAPREP W/TINT 26 (MISCELLANEOUS) ×2 IMPLANT
CLAMP UMBILICAL CORD (MISCELLANEOUS) ×1 IMPLANT
CLOTH BEACON ORANGE TIMEOUT ST (SAFETY) ×1 IMPLANT
DERMABOND ADVANCED .7 DNX12 (GAUZE/BANDAGES/DRESSINGS) ×1 IMPLANT
DRSG OPSITE POSTOP 4X10 (GAUZE/BANDAGES/DRESSINGS) ×1 IMPLANT
ELECT REM PT RETURN 9FT ADLT (ELECTROSURGICAL) ×1
ELECTRODE REM PT RTRN 9FT ADLT (ELECTROSURGICAL) ×1 IMPLANT
EXTRACTOR VACUUM BELL STYLE (SUCTIONS) IMPLANT
GAUZE SPONGE 4X4 12PLY STRL LF (GAUZE/BANDAGES/DRESSINGS) IMPLANT
GLOVE BIOGEL PI IND STRL 6.5 (GLOVE) ×1 IMPLANT
GLOVE BIOGEL PI IND STRL 7.0 (GLOVE) ×2 IMPLANT
GLOVE SURG SS PI 6.0 STRL IVOR (GLOVE) ×1 IMPLANT
GOWN STRL REUS W/TWL LRG LVL3 (GOWN DISPOSABLE) ×2 IMPLANT
KIT ABG SYR 3ML LUER SLIP (SYRINGE) IMPLANT
NDL HYPO 25X5/8 SAFETYGLIDE (NEEDLE) IMPLANT
NEEDLE HYPO 25X5/8 SAFETYGLIDE (NEEDLE) IMPLANT
NS IRRIG 1000ML POUR BTL (IV SOLUTION) ×1 IMPLANT
PACK C SECTION WH (CUSTOM PROCEDURE TRAY) ×1 IMPLANT
PAD ABD 8X10 STRL (GAUZE/BANDAGES/DRESSINGS) IMPLANT
PAD OB MATERNITY 4.3X12.25 (PERSONAL CARE ITEMS) ×1 IMPLANT
RTRCTR C-SECT PINK 25CM LRG (MISCELLANEOUS) IMPLANT
SUT PDS AB 0 CTX 36 PDP370T (SUTURE) IMPLANT
SUT PLAIN ABS 2-0 CT1 27XMFL (SUTURE) IMPLANT
SUT VIC AB 0 CT1 36 (SUTURE) ×2 IMPLANT
SUT VIC AB 0 CTX36XBRD ANBCTRL (SUTURE) ×2 IMPLANT
SUT VIC AB 2-0 CT1 TAPERPNT 27 (SUTURE) ×2 IMPLANT
SUT VIC AB 4-0 KS 27 (SUTURE) ×1 IMPLANT
SUT VICRYL+ 3-0 36IN CT-1 (SUTURE) ×1 IMPLANT
TOWEL OR 17X24 6PK STRL BLUE (TOWEL DISPOSABLE) ×1 IMPLANT
TRAY FOLEY W/BAG SLVR 14FR LF (SET/KITS/TRAYS/PACK) ×1 IMPLANT
WATER STERILE IRR 1000ML POUR (IV SOLUTION) ×1 IMPLANT

## 2023-06-06 NOTE — H&P (Signed)
 Valerie  Graham is a 33 y.o. female G2P1001 @ 38.5 presenting in labor with spontaneous rupture of membranes of clear fluid at 0350. +Fms.  She was diagnosed with vial pneumonia on 2/1 with +metapneumovirus  Pregnancy is complicated by:  - Iron  deficiency anemia: s/p IV iron  infusion x 3 - Anxiety: on Zoloft . Hx of PPD - Previous c-section x 1: AoDe w/ fetal macrosomia. Was scheduled for RLTCS 2/10 - IVF pregnancy: PGT-A neg, no NIPT. 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  OB History     Gravida  2   Para  1   Term  1   Preterm  0   AB  0   Living  1      SAB  0   IAB  0   Ectopic  0   Multiple  0   Live Births  1          Past Medical History:  Diagnosis Date   Anxiety    Chicken pox    Closed fracture of left distal radius 07/11/2022   Depression    Family history of ovarian cancer    Family history of prostate cancer    Family history of skin cancer    Ganglion cyst of dorsum of left wrist 07/11/2022   Herpes simplex without mention of complication 07/02/2011   Tendinitis of extensor tendon of left hand 07/11/2022   Past Surgical History:  Procedure Laterality Date   CESAREAN SECTION N/A 07/19/2021   Procedure: CESAREAN SECTION;  Surgeon: Delana Ted Morrison, DO;  Location: [facility] LD ORS;  Service: Obstetrics;  Laterality: N/A;   TONSILLECTOMY  2014   WISDOM TOOTH EXTRACTION     Family History: family history includes Cancer in her paternal aunt, paternal grandfather, and another family member; Cancer (age of onset: 57) in her father; Cancer (age of onset: 69) in her paternal uncle; Cancer (age of onset: 80) in her maternal grandmother; Diabetes in her maternal aunt; Endometriosis (age of onset: 40) in her mother; Stroke in her paternal uncle. Social History:  reports that she has never smoked. She has never used  smokeless tobacco. She reports that she does not currently use alcohol. She reports that she does not use drugs.     Maternal Diabetes: No Genetic Screening: PGT-A neg, declined NIPT Maternal Ultrasounds/Referrals: Normal Fetal Ultrasounds or other Referrals:  Fetal echo, Referred to Materal Fetal Medicine  Maternal Substance Abuse:  No Significant Maternal Medications:  Meds include: Zoloft  Significant Maternal Lab Results:  Group B Strep negative, false positive RPR Number of Prenatal Visits:greater than 3 verified prenatal visits Maternal Vaccinations:TDap Other Comments:  None  Review of Systems  Constitutional:  Negative for chills and fever.  Respiratory:  Positive for shortness of breath. Negative for chest tightness.   Cardiovascular:  Negative for chest pain.  Gastrointestinal:  Positive for abdominal pain. Negative for constipation.  Genitourinary:  Positive for vaginal discharge. Negative for pelvic pain and vaginal bleeding.   History Dilation: 2 Effacement (%): 80 Station: -2 Exam by:: [employer], RN Blood pressure 115/73, pulse 91, temperature 97.8 F (36.6 C), temperature source Oral, resp. rate 20, height 5' 7 (1.702 m), weight 84.3 kg, last menstrual period 12/30/2021, not currently breastfeeding. Exam Physical Exam Constitutional:      General: She is not in acute distress.    Appearance: Normal appearance.  HENT:     Head: Normocephalic and atraumatic.  Pulmonary:     Effort: Pulmonary effort is normal.  Abdominal:     Comments: gravid  Musculoskeletal:        General: Normal range of motion.  Skin:    General: Skin is warm and dry.  Neurological:     Mental Status: She is alert.  Psychiatric:        Mood and Affect: Mood normal.        Behavior: Behavior normal.     Prenatal labs: ABO, Rh:   Antibody: Negative (08/05 0000) Rubella: Immune (08/05 0000) RPR: Nonreactive (08/05 0000)  HBsAg: Negative (08/05 0000)  HIV: Non-reactive  (08/05 0000)  GBS: negative    Assessment/Plan: 33yo G2P1001 @ 38.5  - Admit for RLTCS in the setting of SROM/labor - Ancef /azithromycin  - Duonebs PRN - Anxiety: continue Zoloft    Larraine DELENA Sharps 06/06/2023, 5:04 AM

## 2023-06-06 NOTE — Anesthesia Preprocedure Evaluation (Signed)
Anesthesia Evaluation  Patient identified by MRN, date of birth, ID band Patient awake    Reviewed: Allergy & Precautions, NPO status , Patient's Chart, lab work & pertinent test results  Airway Mallampati: II  TM Distance: >3 FB Neck ROM: Full    Dental   Pulmonary neg pulmonary ROS   Pulmonary exam normal        Cardiovascular negative cardio ROS Normal cardiovascular exam     Neuro/Psych negative neurological ROS     GI/Hepatic negative GI ROS, Neg liver ROS,,,  Endo/Other  negative endocrine ROS    Renal/GU negative Renal ROS     Musculoskeletal   Abdominal   Peds  Hematology negative hematology ROS (+)   Anesthesia Other Findings   Reproductive/Obstetrics (+) Pregnancy (Previous c-section x 1 in labor for repeat)                             Anesthesia Physical Anesthesia Plan  ASA: 2  Anesthesia Plan: Spinal   Post-op Pain Management:    Induction:   PONV Risk Score and Plan: 2 and Dexamethasone, Ondansetron and Treatment may vary due to age or medical condition  Airway Management Planned: Natural Airway  Additional Equipment:   Intra-op Plan:   Post-operative Plan:   Informed Consent: I have reviewed the patients History and Physical, chart, labs and discussed the procedure including the risks, benefits and alternatives for the proposed anesthesia with the patient or authorized representative who has indicated his/her understanding and acceptance.       Plan Discussed with: CRNA  Anesthesia Plan Comments:        Anesthesia Quick Evaluation

## 2023-06-06 NOTE — Lactation Note (Signed)
 This note was copied from a baby's chart. Lactation Consultation Note  Patient Name: Valerie Graham  [place] Today's Date: 06/06/2023 Age:33 hours  Mom chooses to formula feed.   Maternal Data    Feeding    LATCH Score                    Lactation Tools Discussed/Used    Interventions    Discharge    Consult Status Consult Status: Complete    Shenandoah Yeats G 06/06/2023, 7:17 PM

## 2023-06-06 NOTE — MAU Note (Signed)
 Pt says SROM  at 0350. Is a rept C/S   06-11-2023 UC's strong after SROM

## 2023-06-06 NOTE — Op Note (Addendum)
 C-Section Operative Note  Date: 06/06/23  Preoperative Diagnosis: 1) IUP at [redacted] weeks gestation 2) Spontaneous rupture of membranes with subsequent labor 3) Previous cesarean section x1 4) IVF pregnancy   Postoperative Diagnosis:  1) IUP at [redacted] weeks gestation 2) Spontaneous rupture of membranes with subsequent labor 3) Previous cesarean section  4) IVF pregnancy  5) Uterine rupture  6) Postpartum hemorrhage  Procedure: Repeat low transverse cesarean section with T extension  Surgeon: LOIS Sharps, DO Assistants:  1) P. Constant, MD 2) E. Nicholaus, MD    An experienced assistant was required given the standard of surgical care given the complexity of the case.  This assistant was needed for exposure, dissection, suctioning, retraction, instrument exchange, assisting with delivery with administration of fundal pressure, and for overall help during the procedure.     Operative Findings: Gravid uterus without evidence of adhesive disease. Uterus with active anterior uterine rupture. Anterior placenta. Clear amniotic fluid. Fetal head very low within maternal pelvis. Viable female infant in cephalic position weighing 3350 with APGARS of 3 and 7 at 1 and 5 minutes, respectively. Normal fallopian tubes and ovaries bilaterally. Arterial cord pH 7.17 Specimens: Placenta to pathology EBL 1065 IVF 2100 UOP 150  Patient Course: 33 yo G2P1001 at 38 weeks presented in latent labor with spontaneous rupture of membranes. She opted for elective repeat cesarean. She declines sterilization.  Consent:  R/B/A of cesarean section discussed with patient. Alternative would be vaginal delivery which would mean shorter postpartum stay and decreased risk of bleeding. Risks of cesarean section include but are not limited to infection of the uterus, pelvic organs, or skin, inadvertent injury to internal organs, such as bowel or bladder, vasculature, and nerves. If there is major injury, extensive surgery may be  required. If injury is minor, it may be treated with relative ease and repaired at the time of injury. Discussed possibility of excessive blood loss and transfusion. If bleeding cannot be controlled using medical or minor surgical methods, a cesarean hysterectomy may be performed which would mean no future fertility. Patient accepts the possibility of blood transfusion, if necessary. Patient understands and agrees to move forward with section.   Operative Procedure: Patient was taken to the operating room where spinal anesthesia was found to be adequate by Allis clamp test. She was prepped and draped in the normal sterile fashion in the dorsal supine position with a leftward tilt. An appropriate time out was performed. A skin incision was made to excise her previous scar in its entirety with a scalpel and sharp dissection carried through to the underlying layer of fascia. The fascia was nicked in the midline and the incision was extended laterally with Mayo scissors. The superior aspect of the incision was grasped Coker clamps and dissected off the underlying rectus muscles. In a similar fashion the inferior aspect was dissected off the rectus muscles. Rectus muscles were separated in the midline and the peritoneal cavity entered with careful sharp dissection. The peritoneal incision was then extended both superiorly and inferiorly with careful attention to avoid both bowel and bladder. The Alexis self-retaining wound retractor was then placed within the incision and the lower uterine segment exposed.   On entry, the hysterotomy was noted to be actively rupturing. The lower uterine segment was then carefully incised in a low transverse fashion and the cavity itself entered bluntly. The incision was extended bluntly. The fetal head was well within the maternal pelvis and an attempt was made to elevate it to the hysterotomy,  but it was unsuccessful. Additional help was mobilized and the uterine incision was  extended in a T fashion. The fetus was delivered using breech maneuvers. The cord was clamped and cut upon delivery and the infant was immediately handed off to the waiting NICU team. The placenta was then spontaneously expressed from the uterus and the uterus cleared of all clots and debris with moist lap sponge. The uterine incision was then repaired in 2 layers the first layer was a running locked layer of 0-vicryl and the second an imbricating layer of the same suture. The T extension was small and was able to be incorporated into the primary closure. The tubes and ovaries were inspected and the gutters cleared of all clots and debris. The uterine incision was inspected and found to be hemostatic. All instruments and sponges as well as the Alexis retractor were then removed from the abdomen. The rectus muscles were then reapproximated with several interrupted mattress sutures of 2-0 Vicryl. The fascia was then closed with 0 Vicryl in a running fashion. Subcutaneous tissue was reapproximated with 3-0 plain in a running fashion. The skin was closed with a subcuticular stitch of 4-0 Vicryl on a Keith needle and then reinforced with dermabond and a Honeycomb dressing. At the conclusion of the procedure all instruments and sponge counts were correct. Patient was taken to the recovery room in good condition. Her neonate was admitted to the NICU for observation.   Following delivery, patient and her husband were briefed on the events of the delivery. Questions were solicited and answered to their satisfaction.

## 2023-06-06 NOTE — Anesthesia Procedure Notes (Signed)
 Spinal  Patient location during procedure: OR Start time: 06/06/2023 6:03 AM End time: 06/06/2023 6:08 AM Reason for block: surgical anesthesia Staffing Anesthesiologist: Epifanio Charleston, MD Performed by: Epifanio Charleston, MD Authorized by: Epifanio Charleston, MD   Preanesthetic Checklist Completed: patient identified, IV checked, site marked, risks and benefits discussed, surgical consent, monitors and equipment checked, pre-op evaluation and timeout performed Spinal Block Patient position: sitting Prep: DuraPrep Patient monitoring: heart rate, cardiac monitor, continuous pulse ox and blood pressure Approach: midline Location: L3-4 Injection technique: single-shot Needle Needle type: Pencan  Needle gauge: 24 G Needle length: 9 cm Assessment Sensory level: T4 Events: CSF return

## 2023-06-06 NOTE — Transfer of Care (Signed)
 Immediate Anesthesia Transfer of Care Note  Patient: Valerie Graham  Procedure(s) Performed: CESAREAN SECTION (Abdomen)  Patient Location: PACU  Anesthesia Type:Spinal  Level of Consciousness: awake  Airway & Oxygen Therapy: Patient Spontanous Breathing  Post-op Assessment: Report given to RN and Post -op Vital signs reviewed and stable  Post vital signs: Reviewed and stable  Last Vitals:  Vitals Value Taken Time  BP 91/61 06/06/23 0739  Temp    Pulse 89 06/06/23 0740  Resp    SpO2 99 % 06/06/23 0740  Vitals shown include unfiled device data.  Last Pain:  Vitals:   06/06/23 0420  TempSrc: Oral         Complications: No notable events documented.

## 2023-06-06 NOTE — Anesthesia Postprocedure Evaluation (Signed)
 Anesthesia Post Note  Patient: Valerie  Graham  Procedure(s) Performed: CESAREAN SECTION (Abdomen)     Patient location during evaluation: PACU Anesthesia Type: Spinal Level of consciousness: awake and alert Pain management: pain level controlled Vital Signs Assessment: post-procedure vital signs reviewed and stable Respiratory status: spontaneous breathing and respiratory function stable Cardiovascular status: blood pressure returned to baseline and stable Postop Assessment: spinal receding Anesthetic complications: no  No notable events documented.  Last Vitals:  Vitals:   06/06/23 0815 06/06/23 0830  BP: (!) 87/58 (!) 89/63  Pulse: 85 86  Resp: 18 (!) 21  Temp:    SpO2: 99% 97%    Last Pain:  Vitals:   06/06/23 0830  TempSrc:   PainSc: 0-No pain   Pain Goal:    LLE Motor Response: Purposeful movement (06/06/23 0830) LLE Sensation: Tingling (06/06/23 0830) RLE Motor Response: Purposeful movement (06/06/23 0830) RLE Sensation: Tingling (06/06/23 0830)     Epidural/Spinal Function Cutaneous sensation: Tingles (06/06/23 0830), Patient able to flex knees: Yes (06/06/23 0830), Patient able to lift hips off bed: Yes (06/06/23 0830), Back pain beyond tenderness at insertion site: No (06/06/23 0830), Progressively worsening motor and/or sensory loss: No (06/06/23 0830), Bowel and/or bladder incontinence post epidural: No (06/06/23 0830)  Epifanio Lamar BRAVO

## 2023-06-07 ENCOUNTER — Ambulatory Visit: Payer: 59

## 2023-06-07 LAB — CBC
HCT: 23.1 % — ABNORMAL LOW (ref 36.0–46.0)
Hemoglobin: 7.7 g/dL — ABNORMAL LOW (ref 12.0–15.0)
MCH: 29.1 pg (ref 26.0–34.0)
MCHC: 33.3 g/dL (ref 30.0–36.0)
MCV: 87.2 fL (ref 80.0–100.0)
Platelets: 171 10*3/uL (ref 150–400)
RBC: 2.65 MIL/uL — ABNORMAL LOW (ref 3.87–5.11)
RDW: 21.2 % — ABNORMAL HIGH (ref 11.5–15.5)
WBC: 10.6 10*3/uL — ABNORMAL HIGH (ref 4.0–10.5)
nRBC: 0 % (ref 0.0–0.2)

## 2023-06-07 LAB — T.PALLIDUM AB, TOTAL: T Pallidum Abs: NONREACTIVE

## 2023-06-07 MED ORDER — SODIUM CHLORIDE 0.9 % IV BOLUS: 500.0000 mL | Freq: Once | INTRAVENOUS | Status: DC | PRN

## 2023-06-07 MED ORDER — SODIUM CHLORIDE 0.9 % IV SOLN
500.0000 mg | Freq: Once | INTRAVENOUS | Status: AC
  Administered 2023-06-07: 500 mg via INTRAVENOUS
  Filled 2023-06-07: qty 25

## 2023-06-07 MED ORDER — DIPHENHYDRAMINE HCL 50 MG/ML IJ SOLN
25.0000 mg | Freq: Once | INTRAMUSCULAR | Status: DC | PRN
Start: 2023-06-07 — End: 2023-06-09

## 2023-06-07 MED ORDER — ALBUTEROL SULFATE (2.5 MG/3ML) 0.083% IN NEBU: 2.5000 mg | INHALATION_SOLUTION | Freq: Once | RESPIRATORY_TRACT | Status: DC | PRN

## 2023-06-07 MED ORDER — METHYLPREDNISOLONE SODIUM SUCC 125 MG IJ SOLR
125.0000 mg | Freq: Once | INTRAMUSCULAR | Status: DC | PRN
Start: 2023-06-07 — End: 2023-06-09

## 2023-06-07 MED ORDER — EPINEPHRINE 0.3 MG/0.3ML IJ SOAJ: 0.3000 mg | Freq: Once | INTRAMUSCULAR | Status: DC | PRN

## 2023-06-07 MED ORDER — SODIUM CHLORIDE 0.9 % IV SOLN: INTRAVENOUS | Status: AC | PRN

## 2023-06-07 NOTE — Progress Notes (Addendum)
 Subjective: Postpartum Day 1: Cesarean Delivery  Pain is controlled. Reports dizziness with ambulation. voiding, tolerating PO. Minimal lochia. Breastfeeding.   Objective: Patient Vitals for the past 24 hrs:  BP Temp Temp src Pulse Resp SpO2  06/07/23 0526 97/61 97.8 F (36.6 C) Oral 71 18 100 %  06/07/23 0244 (!) 82/58 98.2 F (36.8 C) Oral 92 16 99 %  06/06/23 2145 (!) 88/54 98.4 F (36.9 C) Oral 76 16 98 %  06/06/23 1644 90/60 98.9 F (37.2 C) Axillary 78 17 97 %  06/06/23 1515 -- -- -- -- -- 97 %  06/06/23 1256 (!) 97/58 98.4 F (36.9 C) Axillary 79 17 --  06/06/23 1126 91/62 -- -- 74 -- 98 %  06/06/23 1015 97/63 98.5 F (36.9 C) Oral 72 (!) 22 97 %  06/06/23 0909 (!) 91/46 98.5 F (36.9 C) Oral 84 (!) 21 99 %  06/06/23 0845 94/63 -- -- 79 (!) 23 98 %    Physical Exam:  General: alert, cooperative, and no distress Lochia: appropriate Uterine Fundus: firm Incision: healing well, no significant drainage, no dehiscence, no significant erythema DVT Evaluation: No evidence of DVT seen on physical exam.  Recent Labs    06/06/23 0505 06/07/23 0542  HGB 11.8* 7.7*  HCT 35.2* 23.1*    Assessment/Plan: Valerie  Graham G2P2002 POD#1 sp RCS at [redacted]w[redacted]d, complicated by uterine rupture 1. PPC: routine PP care 2. ABLA, EBL 1065, clinically significant: symptomatic. Discussed IV iron  vs. Blood transfusion. She elects for IV iron .  3. Rh pos 4. Viral pneumonia: controlled on nebulizer 5. Anxiety: continue home Zoloft  6.Dispo: anticipate discharge POD 2 vs. 3  Valerie Graham 06/07/2023, 8:34 AM

## 2023-06-07 NOTE — Progress Notes (Signed)
 CSW received consult due to score 12 on Edinburgh Depression Screen.  CSW met MOB at bedside to complete a mental health assessment and offer support. CSW entered the room, introduced herself and acknowledged that FOB and family was present. CSW asked MOB for privacy could the guest step out for the assessment; MOB was agreeable and the guest stepped out while FOB stayed; and MOB gave CSW verbal permission to speak about anything while her was present. CSW explained her role and the reason for the visit. MOB presented as calm, was agreeable to consult and remained engaged throughout encounter  CSW acknowledged Edinburgh score of 12; and MOB reported leading up to the delivery she was anxious and then the actual delivery did not go as planned which was an scary adjustment in the moment. Patient declines a referral to [employer].Patient verbalizes understanding that the appointment will be virtual.  CSW inquired about MOB's mental health history. MOB reported being diagnosed with anxiety over 10-12 years ago and have been prescribed medication and participated in therapy for support. MOB reported currently being prescribed Zoloft  50mg ; and finds the support helpful at the moment however, will consider either changing medications or increasing the dose of her current as she progresses in the PP period. MOB reported working for [employer] currently and will utilize the mental health resources if the additional support is needed. MOB reported PPD in 2023 with her first pregnancy with symptoms that Included crying constantly and first time mom worries. MOB reported for support her and FOB would walk outside and continue taking her medication as prescribed. MOB reported her supports as FOB, cousin and friends. CSW provided education regarding Baby Blues vs PMADs and provided MOB with resources for mental health follow up.  CSW encouraged MOB to evaluate her mental health throughout the postpartum period  with the use of the New Mom Checklist developed by Postpartum [facility] as well as the Edinburgh Postnatal Depression Scale and notify a medical professional if symptoms arise. CSW assessed for safety with MOB SI and HI; MOB denied all. CSW did not assess for DV; FOB was present.  CSW asked MOB has she selected a pediatrician for the infant's follow up visits; MOB said [facility].  MOB reported having all essential items for the infant including a carseat, bassinet and crib for safe sleeping. CSW provided review of Sudden Infant Death Syndrome (SIDS) precautions.  CSW identifies no further need for intervention and no barriers to discharge at this time.  Rosina Molt, [place] Clinical Social Worker [phone]

## 2023-06-08 ENCOUNTER — Encounter (HOSPITAL_COMMUNITY)
Admission: RE | Admit: 2023-06-08 | Discharge: 2023-06-08 | Disposition: A | Discharge status: Home / Self Care | Source: Ambulatory Visit

## 2023-06-08 LAB — SURGICAL PATHOLOGY

## 2023-06-08 NOTE — Progress Notes (Signed)
 Subjective: Postpartum Day 2: Cesarean Delivery Pain is controlled. Feeling exhausted with ambulation. Voiding, tolerating PO. Minimal lochia. Breastfeeding  Objective: Vital signs in last 24 hours: Temp:  [98.2 F (36.8 C)-98.5 F (36.9 C)] 98.2 F (36.8 C) (02/07 0545) Pulse Rate:  [67-71] 67 (02/07 0545) Resp:  [16-18] 18 (02/07 0545) BP: (92-98)/(58-61) 98/58 (02/07 0545) SpO2:  [97 %-100 %] 100 % (02/07 0545)  Physical Exam:  General: alert, cooperative, and no distress Lochia: appropriate Uterine Fundus: firm Incision: Honeycomb in place, no significant strikethrough DVT Evaluation: No evidence of DVT seen on physical exam. No significant calf/ankle edema.  Recent Labs    06/06/23 0505 06/07/23 0542  HGB 11.8* 7.7*  HCT 35.2* 23.1*    Assessment/Plan: 33 y.o. female G2 now P2 POD2 s/p RLTCS @ 38.5 complicated by uterine rupture and postpartum hemorrhage after presenting in labor -PP: Continue routine care. Reassured her to utilize narcotic pain medication, especially when ambulating, as it is safe with breastfeeding infant. Abdominal binder for support - Lovenox and SCDs for DVT ppx. -Acute blood loss anemia: Clinically significant for this hospitalization. Hgb 11.8-7.7 post op. S/p IV iron  yesterday. Patient states she has always felt worse the day after IV iron  infusions - Anxiety: Continue Zoloft  -Rh pos - Dispo: anticipate discharge tomorrow  Valerie DELENA Sharps, DO 06/08/2023, 2:26 PM

## 2023-06-09 ENCOUNTER — Other Ambulatory Visit (HOSPITAL_COMMUNITY): Payer: Self-pay

## 2023-06-09 MED ORDER — IBUPROFEN 600 MG PO TABS
600.0000 mg | ORAL_TABLET | Freq: Four times a day (QID) | ORAL | 0 refills | Status: AC
  Filled 2023-06-09: qty 60, 15d supply, fill #0

## 2023-06-09 MED ORDER — SERTRALINE HCL 50 MG PO TABS
75.0000 mg | ORAL_TABLET | Freq: Every day | ORAL | 3 refills | Status: AC
  Filled 2023-06-09: qty 135, 90d supply, fill #0
  Filled 2023-11-02 – 2023-11-03 (×2): qty 135, 90d supply, fill #1
  Filled 2024-03-04 (×2): qty 135, 90d supply, fill #2

## 2023-06-09 MED ORDER — OXYCODONE HCL 5 MG PO TABS
5.0000 mg | ORAL_TABLET | Freq: Four times a day (QID) | ORAL | 0 refills | Status: AC | PRN
  Filled 2023-06-09: qty 20, 3d supply, fill #0

## 2023-06-09 NOTE — Discharge Summary (Signed)
 Postpartum Discharge Summary  Date of Service updated 06/09/23     Patient Name: Valerie  Graham DOB: May 16, 1990 MRN: [id number]  Date of admission: 06/06/2023 Delivery date:06/06/2023 Delivering provider: CLAUDENE MORT A Date of discharge: 06/09/2023  Admitting diagnosis: History of cesarean delivery [Z98.891] Indication for care in labor or delivery [O75.9] Intrauterine pregnancy: [redacted]w[redacted]d     Secondary diagnosis:  Principal Problem:   History of cesarean delivery Active Problems:   Indication for care in labor or delivery  Additional problems: SROM, PPH, uterine rupture    Discharge diagnosis: Term Pregnancy Delivered                                              Post partum procedures: IV iron  Augmentation: N/A Complications: Hemorrhage>1013mL and Uterine Rupture  Hospital course: Onset of Labor With Unplanned C/S   33 y.o. yo G2P2002 at [redacted]w[redacted]d was admitted with rupture of membranes and spontaneous labor on 06/06/2023. She desired an elective repeat c/s. The patient went for cesarean section due to Elective Repeat. Delivery details as follows: Membrane Rupture Time/Date: 3:50 AM,06/06/2023  Delivery Method:C-Section, Low Transverse Operative Delivery:N/A Details of operation can be found in separate operative note, including spontaneous uterine rupture identified intra op. Patient had a postpartum course complicated by ABLA requiring IV iron .  She is ambulating,tolerating a regular diet, passing flatus, and urinating well.  Histoyr of anxiety / depression on zoloft  50mg  daily.  Elected to increase to 75mg  daily at time of dischargePatient is discharged home in stable condition 06/09/23.  Newborn Data: Birth date:06/06/2023 Birth time:6:46 AM Gender:Female Living status:Living Apgars:3 ,7  Weight:3350 g  Magnesium Sulfate received: No BMZ received: No Rhophylac:No MMR:No T-DaP:Given prenatally Flu: No RSV Vaccine received: No Transfusion:No Immunizations  administered: Immunization History  Administered Date(s) Administered   PFIZER Comirnaty(Gray Top)Covid-19 Tri-Sucrose Vaccine 08/04/2019, 09/08/2019   Tdap 05/06/2021    Physical exam  Vitals:   06/08/23 0545 06/08/23 1555 06/08/23 2112 06/09/23 0517  BP: (!) 98/58 91/68 109/60 101/77  Pulse: 67 69 72 66  Resp: 18 16 18 18   Temp: 98.2 F (36.8 C) 98.3 F (36.8 C) 98.3 F (36.8 C) 98.2 F (36.8 C)  TempSrc: Oral Oral Oral Oral  SpO2: 100% 97% 99% 99%  Weight:      Height:       General: alert, cooperative, and no distress Lochia: appropriate Uterine Fundus: firm Incision: Dressing is clean, dry, and intact DVT Evaluation: No evidence of DVT seen on physical exam. Labs: Lab Results  Component Value Date   WBC 10.6 (H) 06/07/2023   HGB 7.7 (L) 06/07/2023   HCT 23.1 (L) 06/07/2023   MCV 87.2 06/07/2023   PLT 171 06/07/2023      Latest Ref Rng & Units 05/06/2023    7:07 PM  CMP  Glucose 70 - 99 mg/dL 889   BUN 6 - 20 mg/dL <5   Creatinine 9.55 - 1.00 mg/dL 9.45   Sodium 864 - 854 mmol/L 136   Potassium 3.5 - 5.1 mmol/L 3.3   Chloride 98 - 111 mmol/L 107   CO2 22 - 32 mmol/L 19   Calcium 8.9 - 10.3 mg/dL 8.8   Total Protein 6.5 - 8.1 g/dL 5.6   Total Bilirubin 0.0 - 1.2 mg/dL 0.3   Alkaline Phos 38 - 126 U/L 116   AST 15 - 41 U/L  22   ALT 0 - 44 U/L 11    Edinburgh Score:    06/06/2023   12:56 PM  Edinburgh Postnatal Depression Scale Screening Tool  I have been able to laugh and see the funny side of things. 0  I have looked forward with enjoyment to things. 0  I have blamed myself unnecessarily when things went wrong. 2  I have been anxious or worried for no good reason. 2  I have felt scared or panicky for no good reason. 2  Things have been getting on top of me. 2  I have been so unhappy that I have had difficulty sleeping. 1  I have felt sad or miserable. 2  I have been so unhappy that I have been crying. 1  The thought of harming myself has occurred to  me. 0  Edinburgh Postnatal Depression Scale Total 12      After visit meds:  Allergies as of 06/09/2023       Reactions   Penicillins Other (See Comments)   Reaction occurred as a child and pt is uncertain what the reaction was        Medication List     STOP taking these medications    B-D 3CC LUER-LOK SYR 18GX1-1/2 18G X 1-1/2 3 ML Misc Generic drug: SYRINGE-NEEDLE (DISP) 3 ML   Easy Touch Hypodermic Needle 22G X 1-1/2 Misc Generic drug: NEEDLE (DISP) 22 G   ondansetron  8 MG tablet Commonly known as: ZOFRAN    progesterone  200 MG capsule Commonly known as: Prometrium        TAKE these medications    famotidine  40 MG tablet Commonly known as: PEPCID  Take 40 mg by mouth daily.   guaifenesin  100 MG/5ML syrup Commonly known as: ROBITUSSIN Take 200 mg by mouth 3 (three) times daily as needed for cough.   ibuprofen  600 MG tablet Commonly known as: ADVIL  Take 1 tablet (600 mg total) by mouth every 6 (six) hours.   IRON  PO Take 1 tablet by mouth daily.   oxyCODONE  5 MG immediate release tablet Commonly known as: Oxy IR/ROXICODONE  Take 1-2 tablets (5-10 mg total) by mouth every 6 (six) hours as needed for moderate pain (pain score 4-6).   prenatal multivitamin Tabs tablet Take 1 tablet by mouth daily at 12 noon.   sertraline  50 MG tablet Commonly known as: ZOLOFT  Take 1.5 tablets (75 mg total) by mouth daily. What changed: how much to take   Ventolin  HFA 108 (90 Base) MCG/ACT inhaler Generic drug: albuterol  Inhale 1-2 puffs into the lungs every 6 (six) hours as needed for wheezing or shortness of breath.         Discharge home in stable condition Infant Feeding: Breast Infant Disposition:home with mother Discharge instruction: per After Visit Summary and Postpartum booklet. Activity: Advance as tolerated. Pelvic rest for 6 weeks.  Diet: routine diet Anticipated Birth Control: Unsure Postpartum Appointment:4 weeks Additional Postpartum F/U:   n/a Future Appointments:No future appointments. Follow up Visit:  Follow-up Information     Ob/Gyn, Landy Stains Follow up in 4 week(s).   Contact information: [address] [place] [address] [phone]                     06/09/2023 [facility] VELNA GASKINS, MD

## 2023-06-09 NOTE — Progress Notes (Signed)
 Patient is doing well.  She is tolerating PO, ambulating, voiding.  Pain is controlled at rest with oxycodone .  Abdominal soreness with movement.  Lochia is appropriate.  Reports that she increased her zoloft  after her first delivery in the postpartum period and wonders about doing so again  Vitals:   06/08/23 0545 06/08/23 1555 06/08/23 2112 06/09/23 0517  BP: (!) 98/58 91/68 109/60 101/77  Pulse: 67 69 72 66  Resp: 18 16 18 18   Temp: 98.2 F (36.8 C) 98.3 F (36.8 C) 98.3 F (36.8 C) 98.2 F (36.8 C)  TempSrc: Oral Oral Oral Oral  SpO2: 100% 97% 99% 99%  Weight:      Height:        NAD  Abdomen:  soft, appropriate tenderness, incisions intact and without erythema or drainage ext:    Symmetric, no edema bilaterally  Lab Results  Component Value Date   WBC 10.6 (H) 06/07/2023   HGB 7.7 (L) 06/07/2023   HCT 23.1 (L) 06/07/2023   MCV 87.2 06/07/2023   PLT 171 06/07/2023    --/--/O POS (02/05 0505)  A/P    33 y.o. G2P2002 POD #3 s/p RCS at 38.5 due to SROM/labor; complicated by uterine rupture and PPH intraoperatively RPOC: improved pain control with oxycodone .  Discussed option to add in gabapentin ; declines Acute blood loss anemia: Clinically significant for this hospitalization. Hgb 11.8-7.7 post op. S/p IV iron  yesterday. Feeling better today Anxiety: Continue Zoloft , will increase to 75 mg daily.  Has access to therapist prn through [employer] employee system.  Can further increase dose prn Rh pos Discharge to home today

## 2023-06-11 ENCOUNTER — Inpatient Hospital Stay (HOSPITAL_COMMUNITY): Admission: AD | Admit: 2023-06-11 | Source: Home / Self Care | Admitting: Obstetrics and Gynecology

## 2023-06-14 ENCOUNTER — Telehealth (HOSPITAL_COMMUNITY): Payer: Self-pay | Admitting: *Deleted

## 2023-06-14 NOTE — Telephone Encounter (Signed)
Attempted hospital discharge follow-up call. Left message for patient to return RN call with any questions or concerns. Deforest Hoyles, RN, 06/14/23, [phone]

## 2023-06-18 ENCOUNTER — Other Ambulatory Visit (HOSPITAL_COMMUNITY): Payer: Self-pay

## 2023-06-25 ENCOUNTER — Other Ambulatory Visit (HOSPITAL_COMMUNITY): Payer: Self-pay

## 2023-11-03 ENCOUNTER — Other Ambulatory Visit (HOSPITAL_COMMUNITY): Payer: Self-pay

## 2024-02-08 ENCOUNTER — Other Ambulatory Visit (HOSPITAL_BASED_OUTPATIENT_CLINIC_OR_DEPARTMENT_OTHER): Payer: Self-pay

## 2024-02-08 MED ORDER — FLUZONE 0.5 ML IM SUSY
0.5000 mL | PREFILLED_SYRINGE | Freq: Once | INTRAMUSCULAR | 0 refills | Status: AC
  Filled 2024-02-08: qty 0.5, 1d supply, fill #0

## 2024-03-04 ENCOUNTER — Other Ambulatory Visit (HOSPITAL_COMMUNITY): Payer: Self-pay

## 2024-04-14 ENCOUNTER — Emergency Department (HOSPITAL_BASED_OUTPATIENT_CLINIC_OR_DEPARTMENT_OTHER)

## 2024-04-14 ENCOUNTER — Ambulatory Visit
Admission: EM | Admit: 2024-04-14 | Discharge: 2024-04-14 | Disposition: A | Discharge status: Home / Self Care | Attending: Family Medicine | Admitting: Family Medicine

## 2024-04-14 ENCOUNTER — Other Ambulatory Visit: Payer: Self-pay

## 2024-04-14 ENCOUNTER — Emergency Department (HOSPITAL_BASED_OUTPATIENT_CLINIC_OR_DEPARTMENT_OTHER)
Admission: EM | Admit: 2024-04-14 | Discharge: 2024-04-14 | Discharge status: Against Medical Advice | Source: Home / Self Care

## 2024-04-14 ENCOUNTER — Encounter (HOSPITAL_BASED_OUTPATIENT_CLINIC_OR_DEPARTMENT_OTHER): Payer: Self-pay | Admitting: Emergency Medicine

## 2024-04-14 ENCOUNTER — Emergency Department (HOSPITAL_BASED_OUTPATIENT_CLINIC_OR_DEPARTMENT_OTHER)
Admission: EM | Admit: 2024-04-14 | Discharge: 2024-04-15 | Disposition: A | Attending: Emergency Medicine | Admitting: Emergency Medicine

## 2024-04-14 DIAGNOSIS — R1011 Right upper quadrant pain: Secondary | ICD-10-CM | POA: Diagnosis present

## 2024-04-14 DIAGNOSIS — R198 Other specified symptoms and signs involving the digestive system and abdomen: Secondary | ICD-10-CM

## 2024-04-14 DIAGNOSIS — K561 Intussusception: Secondary | ICD-10-CM

## 2024-04-14 DIAGNOSIS — R109 Unspecified abdominal pain: Secondary | ICD-10-CM | POA: Diagnosis present

## 2024-04-14 LAB — CBC
HCT: 34.7 % — ABNORMAL LOW (ref 36.0–46.0)
Hemoglobin: 11.2 g/dL — ABNORMAL LOW (ref 12.0–15.0)
MCH: 28.6 pg (ref 26.0–34.0)
MCHC: 32.3 g/dL (ref 30.0–36.0)
MCV: 88.5 fL (ref 80.0–100.0)
Platelets: 271 K/uL (ref 150–400)
RBC: 3.92 MIL/uL (ref 3.87–5.11)
RDW: 12.8 % (ref 11.5–15.5)
WBC: 8.5 K/uL (ref 4.0–10.5)
nRBC: 0 % (ref 0.0–0.2)

## 2024-04-14 LAB — LACTIC ACID, PLASMA: Lactic Acid, Venous: 0.7 mmol/L (ref 0.5–1.9)

## 2024-04-14 LAB — URINALYSIS, ROUTINE W REFLEX MICROSCOPIC
Bilirubin Urine: NEGATIVE
Glucose, UA: NEGATIVE mg/dL
Ketones, ur: 15 mg/dL — AB
Leukocytes,Ua: NEGATIVE
Nitrite: NEGATIVE
Protein, ur: NEGATIVE mg/dL
Specific Gravity, Urine: >=1.03 (ref 1.005–1.030)
pH: 5.5 (ref 5.0–8.0)

## 2024-04-14 LAB — COMPREHENSIVE METABOLIC PANEL WITH GFR
ALT: 11 U/L (ref 0–44)
AST: 21 U/L (ref 15–41)
Albumin: 4.6 g/dL (ref 3.5–5.0)
Alkaline Phosphatase: 91 U/L (ref 38–126)
Anion gap: 10 (ref 5–15)
BUN: 9 mg/dL (ref 6–20)
CO2: 24 mmol/L (ref 22–32)
Calcium: 9 mg/dL (ref 8.9–10.3)
Chloride: 104 mmol/L (ref 98–111)
Creatinine, Ser: 0.56 mg/dL (ref 0.44–1.00)
GFR, Estimated: >60 mL/min (ref >60)
Glucose, Bld: 91 mg/dL (ref 70–99)
Potassium: 4.2 mmol/L (ref 3.5–5.1)
Sodium: 138 mmol/L (ref 135–145)
Total Bilirubin: <0.2 mg/dL (ref 0.0–1.2)
Total Protein: 7.2 g/dL (ref 6.5–8.1)

## 2024-04-14 LAB — PREGNANCY, URINE: Preg Test, Ur: NEGATIVE

## 2024-04-14 LAB — URINALYSIS, MICROSCOPIC (REFLEX)

## 2024-04-14 LAB — POCT URINE DIPSTICK
Bilirubin, UA: NEGATIVE
Glucose, UA: NEGATIVE mg/dL
Ketones, POC UA: NEGATIVE mg/dL
Leukocytes, UA: NEGATIVE
Nitrite, UA: NEGATIVE
Protein Ur, POC: NEGATIVE mg/dL
Spec Grav, UA: 1.02 (ref 1.010–1.025)
Urobilinogen, UA: 1 U/dL
pH, UA: 7 (ref 5.0–8.0)

## 2024-04-14 LAB — D-DIMER, QUANTITATIVE: D-Dimer, Quant: 0.69 ug{FEU}/mL — ABNORMAL HIGH (ref 0.00–0.50)

## 2024-04-14 LAB — POCT URINE PREGNANCY: Preg Test, Ur: NEGATIVE

## 2024-04-14 LAB — LIPASE, BLOOD: Lipase: 37 U/L (ref 11–51)

## 2024-04-14 LAB — TROPONIN T, HIGH SENSITIVITY: Troponin T High Sensitivity: <15 ng/L (ref 0–19)

## 2024-04-14 MED ORDER — IOHEXOL 350 MG/ML SOLN
100.0000 mL | Freq: Once | INTRAVENOUS | Status: AC | PRN
  Administered 2024-04-14: 20:00:00 100 mL via INTRAVENOUS

## 2024-04-14 NOTE — ED Notes (Addendum)
 Confirmed with lab tech, Carmena, that lab already has a blue top to run a d-dimer.

## 2024-04-14 NOTE — ED Provider Notes (Signed)
 [facility]  Note:  This document was prepared using Conservation officer, historic buildings and may include unintentional dictation errors.  MRN: [id number] DOB: May 20, 1990  Subjective:   Valerie  Graham is a 33 y.o. female presenting for 2-day history of persistent intermittent right upper quadrant abdominal, mid back pain after eating sausage, drinking coffee.  Pain subsided but did not fully resolve. Today, when she ate sausage again felt a dramatic return of the symptoms. Has 1 bowel movement 2-3 times a week. Last BM was today, was complete. No fever, diarrhea, bloody stools, recent antibiotic use, hospitalizations or long distance travel.  Has not eaten raw foods, drank unfiltered water .  No history of GI disorders including Crohn's, IBS, ulcerative colitis.   Current Outpatient Medications  Medication Instructions   albuterol  (VENTOLIN  HFA) 108 (90 Base) MCG/ACT inhaler 1-2 puffs, Inhalation, Every 6 hours PRN   famotidine  (PEPCID ) 40 mg, Daily   Ferrous Sulfate  (IRON  PO) 1 tablet, Daily   guaifenesin  (ROBITUSSIN) 200 mg, 3 times daily PRN   ibuprofen  (ADVIL ) 600 mg, Oral, Every 6 hours   oxyCODONE  (OXY IR/ROXICODONE ) 5-10 mg, Oral, Every 6 hours PRN   Prenatal Vit-Fe Fumarate-FA (PRENATAL MULTIVITAMIN) TABS tablet 1 tablet, Daily   sertraline  (ZOLOFT ) 75 mg, Oral, Daily    Allergies[1]  Past Medical History:  Diagnosis Date   Anxiety    Chicken pox    Closed fracture of left distal radius 07/11/2022   Depression    Family history of ovarian cancer    Family history of prostate cancer    Family history of skin cancer    Ganglion cyst of dorsum of left wrist 07/11/2022   Herpes simplex without mention of complication 07/02/2011   Tendinitis of extensor tendon of left hand 07/11/2022     Past Surgical History:  Procedure Laterality Date   CESAREAN SECTION N/A 07/19/2021   Procedure: CESAREAN SECTION;  Surgeon: Delana Ted Morrison, DO;  Location: [facility]  LD ORS;  Service: Obstetrics;  Laterality: N/A;   CESAREAN SECTION N/A 06/06/2023   Procedure: CESAREAN SECTION;  Surgeon: Claudene Larraine LABOR, DO;  Location: [facility] ORS;  Service: Obstetrics;  Laterality: N/A;   TONSILLECTOMY  2014   WISDOM TOOTH EXTRACTION      Family History  Problem Relation Age of Onset   Cancer Paternal Grandfather    Cancer Maternal Grandmother 14       breast   Cancer Father 80       bladder/prostate metastatic, neg genetic testing 2019   Endometriosis Mother 71       hysterectomy   Diabetes Maternal Aunt    Cancer Paternal Uncle 66       prostate   Stroke Paternal Uncle    Cancer Paternal Aunt        ovarian   Cancer Other        colon cancer    Social History   Occupational History   Occupation: insurance  Tobacco Use   Smoking status: Never   Smokeless tobacco: Never  Vaping Use   Vaping status: Never Used  Substance and Sexual Activity   Alcohol use: Not Currently   Drug use: No   Sexual activity: Not on file     ROS   Objective:   Vitals: BP 93/69 (BP Location: Right Arm)   Pulse 76   Temp 97.8 F (36.6 C) (Oral)   Resp 20   SpO2 98%   Breastfeeding No   Physical Exam Constitutional:  General: She is not in acute distress.    Appearance: Normal appearance. She is well-developed. She is not ill-appearing, toxic-appearing or diaphoretic.  HENT:     Head: Normocephalic and atraumatic.     Nose: Nose normal.     Mouth/Throat:     Mouth: Mucous membranes are moist.     Pharynx: Oropharynx is clear.  Eyes:     General: No scleral icterus.       Right eye: No discharge.        Left eye: No discharge.     Extraocular Movements: Extraocular movements intact.     Conjunctiva/sclera: Conjunctivae normal.  Cardiovascular:     Rate and Rhythm: Normal rate and regular rhythm.     Heart sounds: Normal heart sounds. No murmur heard.    No friction rub. No gallop.  Pulmonary:     Effort: Pulmonary effort is normal. No respiratory  distress.     Breath sounds: No stridor. No wheezing, rhonchi or rales.  Chest:     Chest wall: No tenderness.  Abdominal:     General: Bowel sounds are normal. There is no distension.     Palpations: Abdomen is soft. There is no mass.     Tenderness: There is abdominal tenderness in the right upper quadrant. There is right CVA tenderness and guarding. There is no left CVA tenderness or rebound. Positive signs include Murphy's sign. Negative signs include Rovsing's sign and McBurney's sign.  Skin:    General: Skin is warm and dry.  Neurological:     General: No focal deficit present.     Mental Status: She is alert and oriented to person, place, and time.  Psychiatric:        Mood and Affect: Mood normal.        Behavior: Behavior normal.        Thought Content: Thought content normal.        Judgment: Judgment normal.     Results for orders placed or performed during the hospital encounter of 04/14/24 (from the past 24 hours)  POCT urine pregnancy     Status: Normal   Collection Time: 04/14/24  3:03 PM  Result Value Ref Range   Preg Test, Ur Negative Negative  POCT URINE DIPSTICK     Status: Abnormal   Collection Time: 04/14/24  3:03 PM  Result Value Ref Range   Color, UA yellow yellow   Clarity, UA clear clear   Glucose, UA negative negative mg/dL   Bilirubin, UA negative negative   Ketones, POC UA negative negative mg/dL   Spec Grav, UA 8.979 8.989 - 1.025   Blood, UA large (A) negative   pH, UA 7.0 5.0 - 8.0   Protein Ur, POC negative negative mg/dL   Urobilinogen, UA 1.0 0.2 or 1.0 E.U./dL   Nitrite, UA Negative Negative   Leukocytes, UA Negative Negative    Assessment and Plan :   PDMP not reviewed this encounter.  1. RUQ pain   2. Positive Beverley Sign      Patient is in need of a higher level of care than we can provide in the urgent care setting.  This includes rule out of an acute abdomen, acute cholecystitis among other medical emergencies.  Differential  does include obstructive uropathy given that hematuria and right flank tenderness, diverticulitis, colitis.  Discussed this with patient and verbalizes understanding.  Will present to the emergency room now by personal vehicle as it is appropriate given hemodynamically stable vital  signs.    [1]  Allergies Allergen Reactions   Penicillins Other (See Comments)    Reaction occurred as a child and pt is uncertain what the reaction was     Christopher Savannah, PA-C 04/14/24 1515

## 2024-04-14 NOTE — Discharge Instructions (Signed)
 You are in need of a higher level of care than we can provide in the urgent care setting. This includes rule out of an acute abdomen, an emergency with your gall bladder. A CT scan and bloodwork can be used to rule this out or other diagnoses of the colon, kidneys like diverticulitis, kidney stones. Please head straight to the emergency room.

## 2024-04-14 NOTE — ED Notes (Signed)
 Patient is being discharged from the Urgent Care and sent to the Emergency Department via POV . Per Lyons, Valerie Graham, patient is in need of higher level of care due to acute abdomen, acute cholecystitis. Patient is aware and verbalizes understanding of plan of care.  Vitals:   04/14/24 1447  BP: 93/69  Pulse: 76  Resp: 20  Temp: 97.8 F (36.6 C)  SpO2: 98%

## 2024-04-14 NOTE — ED Notes (Signed)
 Patient transferred from waiting room to ED treatment room. Assuming pt care at this time.

## 2024-04-14 NOTE — ED Provider Notes (Signed)
 Emergency Department Provider Note   I have reviewed the triage vital signs and the nursing notes.   HISTORY  Chief Complaint Abdominal Pain   HPI Valerie Graham is a 33 y.o. female presents to the emergency department with intermittent right upper quadrant abdominal pain for the past 2 days.  She has some associated nausea but no vomiting.  No fevers.  Symptoms are worse after eating.  She notices some discomfort with taking a deep breath as well but no pain radiating up into the chest.  She not feeling short of breath or having syncope.  No prior abdominal surgeries.   Past Medical History:  Diagnosis Date   Anxiety    Chicken pox    Closed fracture of left distal radius 07/11/2022   Depression    Family history of ovarian cancer    Family history of prostate cancer    Family history of skin cancer    Ganglion cyst of dorsum of left wrist 07/11/2022   Herpes simplex without mention of complication 07/02/2011   Tendinitis of extensor tendon of left hand 07/11/2022    Review of Systems  Constitutional: No fever/chills Cardiovascular: Denies chest pain. Respiratory: Denies shortness of breath. Gastrointestinal: Positive RUQ abdominal pain.  No nausea, no vomiting.  Skin: Negative for rash. Neurological: Negative for headaches.  ____________________________________________   PHYSICAL EXAM:  VITAL SIGNS: ED Triage Vitals  Encounter Vitals Group     BP 04/14/24 1541 120/72     Pulse Rate 04/14/24 1541 75     Resp 04/14/24 1541 16     Temp 04/14/24 1541 98.4 F (36.9 C)     Temp Source 04/14/24 1645 Oral     SpO2 04/14/24 1541 100 %     Weight 04/14/24 1541 180 lb (81.6 kg)     Height 04/14/24 1541 5' 7 (1.702 m)   Constitutional: Alert and oriented. Well appearing and in no acute distress. Eyes: Conjunctivae are normal. Head: Atraumatic. Nose: No congestion/rhinnorhea. Mouth/Throat: Mucous membranes are moist. Neck: No stridor.   Cardiovascular: Normal  rate, regular rhythm. Good peripheral circulation. Grossly normal heart sounds.   Respiratory: Normal respiratory effort.  No retractions. Lungs CTAB. Gastrointestinal: Soft with focal tenderness in the right upper quadrant. No peritonitis. No distention.  Musculoskeletal: No gross deformities of extremities. Neurologic:  Normal speech and language.  Skin:  Skin is warm, dry and intact. No rash noted.   ____________________________________________   LABS (all labs ordered are listed, but only abnormal results are displayed)  Labs Reviewed  CBC - Abnormal; Notable for the following components:      Result Value   Hemoglobin 11.2 (*)    HCT 34.7 (*)    All other components within normal limits  URINALYSIS, ROUTINE W REFLEX MICROSCOPIC - Abnormal; Notable for the following components:   APPearance HAZY (*)    Hgb urine dipstick LARGE (*)    Ketones, ur 15 (*)    All other components within normal limits  URINALYSIS, MICROSCOPIC (REFLEX) - Abnormal; Notable for the following components:   Bacteria, UA FEW (*)    All other components within normal limits  D-DIMER, QUANTITATIVE - Abnormal; Notable for the following components:   D-Dimer, Quant 0.69 (*)    All other components within normal limits  LIPASE, BLOOD  COMPREHENSIVE METABOLIC PANEL WITH GFR  PREGNANCY, URINE  LACTIC ACID, PLASMA  TROPONIN T, HIGH SENSITIVITY   ____________________________________________  RADIOLOGY  CT ABDOMEN PELVIS W CONTRAST Result Date: 04/14/2024 EXAM: CT  ABDOMEN AND PELVIS WITH CONTRAST 04/14/2024 08:10:18 PM TECHNIQUE: CT of the abdomen and pelvis was performed with the administration of 100 mL iohexol  (OMNIPAQUE ) 350 MG/ML injection. Multiplanar reformatted images are provided for review. Automated exposure control, iterative reconstruction, and/or weight-based adjustment of the mA/kV was utilized to reduce the radiation dose to as low as reasonably achievable. COMPARISON: None available.  CLINICAL HISTORY: Abdominal pain, acute, nonlocalized; RUQ pain. FINDINGS: LOWER CHEST: No acute abnormality. LIVER: The liver is unremarkable. GALLBLADDER AND BILE DUCTS: Gallbladder is unremarkable. No biliary ductal dilatation. SPLEEN: No acute abnormality. PANCREAS: No acute abnormality. ADRENAL GLANDS: No acute abnormality. KIDNEYS, URETERS AND BLADDER: No stones in the kidneys or ureters. No hydronephrosis. No perinephric or periureteral stranding. Urinary bladder is unremarkable. GI AND BOWEL: Stomach is within normal limits. The appendix is unremarkable. The small bowel shows no obstructive changes, although there is a transient intussusception identified, likely in the mid jejunum, best seen on image number 59 of series 2. PERITONEUM AND RETROPERITONEUM: No ascites. No free air. VASCULATURE: Aorta is normal in caliber. LYMPH NODES: No lymphadenopathy. REPRODUCTIVE ORGANS: IUD is noted within the uterus. Prominent uterus is within normal limits. BONES AND SOFT TISSUES: No acute osseous abnormality. No focal soft tissue abnormality. IMPRESSION: 1. Transient small bowel intussusception in the mid jejunum without obstruction. 2. No other focal abnormality is noted. Electronically signed by: Oneil Devonshire MD 04/14/2024 08:23 PM EST RP Workstation: [id number]   CT Angio Chest PE W and/or Wo Contrast Result Date: 04/14/2024 EXAM: CTA of the Chest with contrast for PE 04/14/2024 08:10:18 PM TECHNIQUE: CTA of the chest was performed without and with the administration of 100 mL of iohexol  (OMNIPAQUE ) 350 MG/ML injection. Multiplanar reformatted images are provided for review. MIP images are provided for review. Automated exposure control, iterative reconstruction, and/or weight based adjustment of the mA/kV was utilized to reduce the radiation dose to as low as reasonably achievable. COMPARISON: None available. CLINICAL HISTORY: Pulmonary embolism (PE) suspected, low to intermediate prob, positive D-dimer.  FINDINGS: PULMONARY ARTERIES: No pulmonary embolism. Main pulmonary artery is normal in caliber. MEDIASTINUM: The heart and pericardium demonstrate no acute abnormality. There is no acute abnormality of the thoracic aorta. LYMPH NODES: No mediastinal, hilar or axillary lymphadenopathy. LUNGS AND PLEURA: The lungs are without acute process. No focal consolidation or pulmonary edema. No pleural effusion or pneumothorax. UPPER ABDOMEN: Limited images of the upper abdomen are unremarkable. SOFT TISSUES AND BONES: No acute bone or soft tissue abnormality. IMPRESSION: 1. No evidence of pulmonary embolism. Electronically signed by: Oneil Devonshire MD 04/14/2024 08:20 PM EST RP Workstation: [id number]   DG Chest 2 View Result Date: 04/14/2024 EXAM: 2 VIEW(S) XRAY OF THE CHEST 04/14/2024 05:45:00 PM COMPARISON: Chest x ray 02/15/2017. CLINICAL HISTORY: chest pain FINDINGS: LUNGS AND PLEURA: No focal pulmonary opacity. No pleural effusion. No pneumothorax. HEART AND MEDIASTINUM: No acute abnormality of the cardiac and mediastinal silhouettes. BONES AND SOFT TISSUES: No acute osseous abnormality. IMPRESSION: 1. No acute cardiopulmonary process. Electronically signed by: Greig Pique MD 04/14/2024 06:29 PM EST RP Workstation: [id number]    ____________________________________________   PROCEDURES  Procedure(s) performed:   Procedures  None  ____________________________________________  EKG:   EKG Interpretation Date/Time:  Monday April 14 2024 18:03:49 EST Ventricular Rate:  72 PR Interval:  142 QRS Duration:  99 QT Interval:  406 QTC Calculation: 445 R Axis:   45  Text Interpretation: Sinus rhythm Low voltage, precordial leads Confirmed by Darra Chew [phone]) on 04/14/2024 6:10:59 PM  INITIAL IMPRESSION / ASSESSMENT AND PLAN / ED COURSE  Pertinent labs & imaging results that were available during my care of the patient were reviewed by me and considered in my medical decision making  (see chart for details).   This patient is Presenting for Evaluation of abdominal pain, which does require a range of treatment options, and is a complaint that involves a high risk of morbidity and mortality.  The Differential Diagnoses includes but is not exclusive to acute cholecystitis, intrathoracic causes for epigastric abdominal pain, gastritis, duodenitis, pancreatitis, small bowel or large bowel obstruction, abdominal aortic aneurysm, hernia, gastritis, etc.   Critical Interventions-    Medications  iohexol  (OMNIPAQUE ) 350 MG/ML injection 100 mL (100 mLs Intravenous Contrast Given 04/14/24 1959)    Reassessment after intervention:  symptoms improved.    I did obtain Additional Historical Information from Mom at bedside as well.   Clinical Laboratory Tests Ordered, included CBC without leukocytosis.  CMP shows normal creatinine, LFTs, bilirubin, lipase. Pregnancy negative.   Radiologic Tests Ordered, included CXR and RUQ US . I independently interpreted the images and agree with radiology interpretation.   Cardiac Monitor Tracing which shows NSR.   Social Determinants of Health Risk patient is a non-smoker.   Consult complete with General Surgery Dr. Dasie. Plan for obs admit with abdominal pain for monitoring and surgery consultation. Will not necessarily require surgery but will observe.   TRH. Plan for obs admit.   Medical Decision Making: Summary:  The patient presents to the emergency department with right upper quadrant abdominal pain and nausea worse with eating.  Seems most consistent with biliary colic.  Considered alternative etiologies such as PE given some pleuritic component to pain.  Will also obtain a screening troponin and chest x-ray although patient low risk for both.  Reevaluation with update and discussion with patient. Plan for admit. She is in agreement.    Patient's presentation is most consistent with acute presentation with potential threat to life  or bodily function.   Disposition: admit  ____________________________________________  FINAL CLINICAL IMPRESSION(S) / ED DIAGNOSES  Final diagnoses:  Intussusception intestine (HCC)    Note:  This document was prepared using Dragon voice recognition software and may include unintentional dictation errors.  Fonda Law, MD, [facility]    Loye Vento, Fonda MATSU, MD 04/15/24 [phone]

## 2024-04-14 NOTE — ED Notes (Addendum)
 Called lab regarding D-Dimer still not being in process yet. Per lab, they don't have a blue top tube despite previous communication confirming that they do. Provider and patient notified of delay.

## 2024-04-14 NOTE — ED Notes (Signed)
 Patient is being discharged from the Urgent Care and sent to the Emergency Department via POV . Per [place] PA C, patient is in need of higher level of care due to acute abd. Patient is aware and verbalizes understanding of plan of care.  Vitals:   04/14/24 1447  BP: 93/69  Pulse: 76  Resp: 20  Temp: 97.8 F (36.6 C)  SpO2: 98%

## 2024-04-14 NOTE — ED Triage Notes (Signed)
 Pt c/o RUQ pain and mid back pain started 2 days ago after eating sausage and drinking coffee-pain subsided and returned today after eating sausage again-NAD-steady gait

## 2024-04-14 NOTE — Progress Notes (Signed)
 Hospitalist Transfer Note:    Nursing staff, Please call [facility] Admits & Consults System-Wide number on Amion [phone]) as soon as patient's arrival, so appropriate admitting provider can evaluate the pt.   Transferring facility: [facility] Requesting provider: Dr. Darra (EDP at [facility]) Reason for transfer: admission for further evaluation and management of abdominal pain, with CT A/P suggestive of potential intussusception.    33 year old female who presented to [facility] ED complaining of prandial abdominal discomfort, more in the epigastrium as well as the right upper abdominal quadrant.  We will this upper abdominal discomfort worsens with eating, the patient also conveyed that the abdominal discomfort intensified with deep inspiration.  In the setting, CTA chest was pursued, and showed no evidence of acute pulmonary embolism.  Additionally, CT abdomen/pelvis was reported to demonstrate potential intussusception.   EDP d/w on-call general surgery, Dr. Dasie, who recommended hospitalist admission to either [facility] or [facility], and conveyed that general surgery will consult. Dr. Dasie did not feel that emergent surgical intervention was indicated at this time, and did not recommend abx at this time.    Subsequently, I accepted this patient for transfer for observation to a med-tele bed at [facility] or [facility] (first available) for further work-up and management of the above.      Eva Pore, DO Hospitalist

## 2024-04-14 NOTE — ED Triage Notes (Signed)
 Pt c/o RUQ pain x 2 days.  + nausea. Denies fever.

## 2024-04-15 NOTE — ED Notes (Signed)
 Family is concerned about pt still waiting for bed placement. Voiced concerns and is ready to go home.

## 2024-04-15 NOTE — ED Notes (Signed)
 Pt was able to eat crackers and ginger ale. Verbalized that she is ready to go home and will follow up with GI and PCP for further evaluation. EDP made aware.

## 2024-04-15 NOTE — Discharge Instructions (Addendum)
 Please call the general surgeons to schedule an appointment.  I think you might also benefit by seeing the gastroenterologist I have given you their information as well.  As we discussed please return for worsening pain fever inability eat or drink.

## 2024-04-15 NOTE — ED Notes (Signed)
 Patient aware of current NPO status.

## 2024-04-15 NOTE — ED Notes (Signed)
 Pt is resting, wondering when she will be moved. No complaints at this time.

## 2024-04-15 NOTE — ED Provider Notes (Signed)
 I assumed care of the patient at 0 700.  Patient found to have intussusception on CT scan.  Awaiting admission.  Patient had been boarding in the ED for at least 20 hours.  She felt like her symptoms had started to improve.  I discussed case with Burnard, general surgery.  Patient was having improved symptoms and was able to tolerate by mouth.  Thought to be reasonable to discharge home at this point per surgery.  Will have her follow-up with general surgery in clinic.  May also benefit from GI evaluation.  PCP follow up.    Emil Share, DO 04/15/24 1246

## 2024-04-16 ENCOUNTER — Other Ambulatory Visit

## 2024-04-16 ENCOUNTER — Encounter: Payer: Self-pay | Admitting: Physician Assistant

## 2024-04-16 ENCOUNTER — Ambulatory Visit (INDEPENDENT_AMBULATORY_CARE_PROVIDER_SITE_OTHER): Admitting: Physician Assistant

## 2024-04-16 ENCOUNTER — Other Ambulatory Visit (HOSPITAL_COMMUNITY): Payer: Self-pay

## 2024-04-16 VITALS — BP 104/60 | HR 76 | Ht 67.0 in | Wt 178.0 lb

## 2024-04-16 DIAGNOSIS — D649 Anemia, unspecified: Secondary | ICD-10-CM

## 2024-04-16 DIAGNOSIS — R1011 Right upper quadrant pain: Secondary | ICD-10-CM | POA: Diagnosis not present

## 2024-04-16 DIAGNOSIS — K5900 Constipation, unspecified: Secondary | ICD-10-CM | POA: Diagnosis not present

## 2024-04-16 DIAGNOSIS — K561 Intussusception: Secondary | ICD-10-CM | POA: Diagnosis not present

## 2024-04-16 DIAGNOSIS — K921 Melena: Secondary | ICD-10-CM

## 2024-04-16 DIAGNOSIS — K219 Gastro-esophageal reflux disease without esophagitis: Secondary | ICD-10-CM

## 2024-04-16 DIAGNOSIS — R1013 Epigastric pain: Secondary | ICD-10-CM

## 2024-04-16 DIAGNOSIS — K5904 Chronic idiopathic constipation: Secondary | ICD-10-CM

## 2024-04-16 DIAGNOSIS — R1084 Generalized abdominal pain: Secondary | ICD-10-CM

## 2024-04-16 LAB — CBC WITH DIFFERENTIAL/PLATELET
Basophils Absolute: 0.1 K/uL (ref 0.0–0.1)
Basophils Relative: 1.4 % (ref 0.0–3.0)
Eosinophils Absolute: 0.2 K/uL (ref 0.0–0.7)
Eosinophils Relative: 3.8 % (ref 0.0–5.0)
HCT: 36.4 % (ref 36.0–46.0)
Hemoglobin: 12.2 g/dL (ref 12.0–15.0)
Lymphocytes Relative: 27.2 % (ref 12.0–46.0)
Lymphs Abs: 1.5 K/uL (ref 0.7–4.0)
MCHC: 33.4 g/dL (ref 30.0–36.0)
MCV: 86.3 fl (ref 78.0–100.0)
Monocytes Absolute: 0.4 K/uL (ref 0.1–1.0)
Monocytes Relative: 7.9 % (ref 3.0–12.0)
Neutro Abs: 3.3 K/uL (ref 1.4–7.7)
Neutrophils Relative %: 59.7 % (ref 43.0–77.0)
Platelets: 257 K/uL (ref 150.0–400.0)
RBC: 4.22 Mil/uL (ref 3.87–5.11)
RDW: 13.2 % (ref 11.5–15.5)
WBC: 5.5 K/uL (ref 4.0–10.5)

## 2024-04-16 LAB — B12 AND FOLATE PANEL
Folate: 17.1 ng/mL (ref >5.9)
Vitamin B-12: 244 pg/mL (ref 211–911)

## 2024-04-16 MED ORDER — PANTOPRAZOLE SODIUM 40 MG PO TBEC
40.0000 mg | DELAYED_RELEASE_TABLET | Freq: Every day | ORAL | 3 refills | Status: AC
  Filled 2024-04-16: qty 90, 90d supply, fill #0

## 2024-04-16 MED ORDER — GOLYTELY 236 G PO SOLR
4000.0000 mL | Freq: Once | ORAL | 0 refills | Status: AC
  Filled 2024-04-16: qty 4000, 1d supply, fill #0

## 2024-04-16 NOTE — Progress Notes (Signed)
 Valerie Console, PA-C [address] [place], [place]  [address] Phone: [phone]   Gastroenterology Consultation  Referring Provider:     Nche, Roselie Rockford, NP Primary Care Physician:  Katheen Roselie Rockford, NP Primary Gastroenterologist:  Valerie Console, PA-C / Dr. Gordy Starch  Reason for Consultation:     ED follow-up intussusception        HPI:   Discussed the use of AI scribe software for clinical note transcription with the patient, who gave verbal consent to proceed.  New patient.  Here for ED follow-up of intussusception.  She went to ED 04/14/2024 [facility].  She was having intermittent RUQ pain for 2 days.  Some nausea but no vomiting.  No fever.  Symptoms worse after eating.  CT scan showed intussusception.  She waited in the ED for at least 20 hours and left.  General surgery advised to follow-up outpatient in the surgery clinic.  They also recommended GI evaluation.  No previous abdominal surgeries.  No previous GI evaluation.  04/14/2024 CT abdomen, pelvis with contrast: IUD in place.  Transient small bowel intussusception in the mid jejunum without obstruction.  No other focal abnormality.  04/14/2024 labs: Urine pregnancy negative.  Mild anemia with hemoglobin 11.2.  Normal CMP and lipase.  06/2023 labs significant for hemoglobin 7.7.  History of Present Illness Right upper quadrant abdominal pain - Onset Saturday after consuming sausage egg and cheese biscuit with sweet tea and coffee - Initially attributed to indigestion; took Pepcid , drank milk, and ate a banana without relief - Pain persisted across upper abdomen and radiated to back - Improved the next morning but worsened after eating steak and cheese sub Sunday night - Worsened again after eating eggs and sausage on Monday - Currently persistent but less severe right upper quadrant pain - Frequent burping - Pain prompted urgent care and ED evaluation  Gastrointestinal bleeding and  bowel changes - She has noticed some intermittent Black, tarry stools - She also has constipation - No use of NSAIDs or aspirin due to low hemoglobin levels  Imaging and hospital evaluation - CT scan at hospital showed possible transient intussusception in the jejunum - Held overnight at hospital but not admitted due to lack of available beds for transfer to a surgical facility  Gynecologic history and menstrual symptoms: Menorrhagia - Currently menstruating, feels this may contribute to symptoms - History of heavy menstrual cycles, required IVF for conception - History of ovarian cyst - Currently has an IUD, which has helped with pain but not with bleeding  Hematologic history - History of low iron  levels, required iron  transfusions post-pregnancy - Takes iron  pill every other day - Hemoglobin was 7 in February, improved to 11.2 recently  Obstetric and surgical history - Uterine rupture during C-section in February 2025 with significant post-surgical pain - No other abdominal surgeries - She has 2 children ages 33-month-old daughter and 33-year-old son.   - She is not currently breast-feeding.  Social history: She works for [employer] in [employer].  Her husband is in the [employer], currently deployed.  She has 2 children: 33-month-old daughter and 33-year-old son.  Past Medical History:  Diagnosis Date   Anxiety    Chicken pox    Closed fracture of left distal radius 07/11/2022   Depression    Family history of ovarian cancer    Family history of prostate cancer    Family history of skin cancer    Ganglion cyst of  dorsum of left wrist 07/11/2022   Herpes simplex without mention of complication 07/02/2011   Tendinitis of extensor tendon of left hand 07/11/2022    Past Surgical History:  Procedure Laterality Date   CESAREAN SECTION N/A 07/19/2021   Procedure: CESAREAN SECTION;  Surgeon: Delana Ted Morrison, DO;  Location: [facility] LD ORS;  Service: Obstetrics;  Laterality: N/A;    CESAREAN SECTION N/A 06/06/2023   Procedure: CESAREAN SECTION;  Surgeon: Claudene Larraine LABOR, DO;  Location: [facility] LD ORS;  Service: Obstetrics;  Laterality: N/A;   TONSILLECTOMY  2014   WISDOM TOOTH EXTRACTION     Prior to Admission medications  Medication Sig Start Date End Date Taking? Authorizing Provider  pantoprazole  (PROTONIX ) 40 MG tablet Take 1 tablet (40 mg total) by mouth daily. 04/16/24  Yes [place], PA-C  polyethylene glycol (GOLYTELY) 236 g solution Take 4,000 mLs by mouth once for 1 dose. 04/16/24 04/17/24 Yes [place], PA-C  sertraline  (ZOLOFT ) 50 MG tablet Take 1.5 tablets (75 mg total) by mouth daily. 06/09/23 06/08/24 Yes Gretta Gums, MD  albuterol  (VENTOLIN  HFA) 108 (90 Base) MCG/ACT inhaler Inhale 1-2 puffs into the lungs every 6 (six) hours as needed for wheezing or shortness of breath. Patient not taking: Reported on 04/16/2024 06/02/23   Emilio Delilah HERO, CNM  famotidine  (PEPCID ) 40 MG tablet Take 40 mg by mouth daily. Patient not taking: Reported on 04/16/2024    [provider]  Ferrous Sulfate  (IRON  PO) Take 1 tablet by mouth daily. Patient not taking: Reported on 04/16/2024    [provider]  guaifenesin  (ROBITUSSIN) 100 MG/5ML syrup Take 200 mg by mouth 3 (three) times daily as needed for cough. Patient not taking: Reported on 04/16/2024    [provider]  ibuprofen  (ADVIL ) 600 MG tablet Take 1 tablet (600 mg total) by mouth every 6 (six) hours. Patient not taking: Reported on 04/16/2024 06/09/23   Gretta Gums, MD  oxyCODONE  (OXY IR/ROXICODONE ) 5 MG immediate release tablet Take 1-2 tablets (5-10 mg total) by mouth every 6 (six) hours as needed for moderate pain (pain score 4-6). Patient not taking: Reported on 04/16/2024 06/09/23   Gretta Gums, MD  Prenatal Vit-Fe Fumarate-FA (PRENATAL MULTIVITAMIN) TABS tablet Take 1 tablet by mouth daily at 12 noon. Patient not taking: Reported on 04/16/2024    [provider]       Family History  Problem Relation Age of Onset   Cancer Paternal Grandfather    Cancer Maternal Grandmother 31       breast   Cancer Father 15       bladder/prostate metastatic, neg genetic testing 2019   Endometriosis Mother 85       hysterectomy   Diabetes Maternal Aunt    Cancer Paternal Uncle 12       prostate   Stroke Paternal Uncle    Cancer Paternal Aunt        ovarian   Cancer Other        colon cancer     Social History[1]  Allergies as of 04/16/2024 - Review Complete 04/16/2024  Allergen Reaction Noted   Penicillins Other (See Comments) 07/17/2021    Review of Systems:    All systems reviewed and negative except where noted in HPI.   Physical Exam:  BP 104/60   Pulse 76   Ht 5' 7 (1.702 m)   Wt 178 lb (80.7 kg)   LMP 04/13/2024   BMI 27.88 kg/m  Patient's last menstrual period was 04/13/2024.  General:   Alert,  Well-developed, well-nourished, pleasant and cooperative in NAD Lungs:  Respirations even and unlabored.  Clear throughout to auscultation.   No wheezes, crackles, or rhonchi. No acute distress. Heart:  Regular rate and rhythm; no murmurs, clicks, rubs, or gallops. Abdomen:  Normal bowel sounds.  No bruits.  Soft, and non-distended without masses, hepatosplenomegaly or hernias noted.  Mild generalized upper abdominal tenderness, worse in the RUQ and epigastrium.  No lower abdominal tenderness.  No guarding or rebound tenderness.    Neurologic:  Alert and oriented x3;  grossly normal neurologically. Psych:  Alert and cooperative. Normal mood and affect.   Imaging Studies: CT ABDOMEN PELVIS W CONTRAST Result Date: 04/14/2024 EXAM: CT ABDOMEN AND PELVIS WITH CONTRAST 04/14/2024 08:10:18 PM TECHNIQUE: CT of the abdomen and pelvis was performed with the administration of 100 mL iohexol  (OMNIPAQUE ) 350 MG/ML injection. Multiplanar reformatted images are provided for review. Automated exposure control, iterative reconstruction, and/or weight-based  adjustment of the mA/kV was utilized to reduce the radiation dose to as low as reasonably achievable. COMPARISON: None available. CLINICAL HISTORY: Abdominal pain, acute, nonlocalized; RUQ pain. FINDINGS: LOWER CHEST: No acute abnormality. LIVER: The liver is unremarkable. GALLBLADDER AND BILE DUCTS: Gallbladder is unremarkable. No biliary ductal dilatation. SPLEEN: No acute abnormality. PANCREAS: No acute abnormality. ADRENAL GLANDS: No acute abnormality. KIDNEYS, URETERS AND BLADDER: No stones in the kidneys or ureters. No hydronephrosis. No perinephric or periureteral stranding. Urinary bladder is unremarkable. GI AND BOWEL: Stomach is within normal limits. The appendix is unremarkable. The small bowel shows no obstructive changes, although there is a transient intussusception identified, likely in the mid jejunum, best seen on image number 59 of series 2. PERITONEUM AND RETROPERITONEUM: No ascites. No free air. VASCULATURE: Aorta is normal in caliber. LYMPH NODES: No lymphadenopathy. REPRODUCTIVE ORGANS: IUD is noted within the uterus. Prominent uterus is within normal limits. BONES AND SOFT TISSUES: No acute osseous abnormality. No focal soft tissue abnormality. IMPRESSION: 1. Transient small bowel intussusception in the mid jejunum without obstruction. 2. No other focal abnormality is noted. Electronically signed by: Oneil Devonshire MD 04/14/2024 08:23 PM EST RP Workstation: [id number]   CT Angio Chest PE W and/or Wo Contrast Result Date: 04/14/2024 EXAM: CTA of the Chest with contrast for PE 04/14/2024 08:10:18 PM TECHNIQUE: CTA of the chest was performed without and with the administration of 100 mL of iohexol  (OMNIPAQUE ) 350 MG/ML injection. Multiplanar reformatted images are provided for review. MIP images are provided for review. Automated exposure control, iterative reconstruction, and/or weight based adjustment of the mA/kV was utilized to reduce the radiation dose to as low as reasonably achievable.  COMPARISON: None available. CLINICAL HISTORY: Pulmonary embolism (PE) suspected, low to intermediate prob, positive D-dimer. FINDINGS: PULMONARY ARTERIES: No pulmonary embolism. Main pulmonary artery is normal in caliber. MEDIASTINUM: The heart and pericardium demonstrate no acute abnormality. There is no acute abnormality of the thoracic aorta. LYMPH NODES: No mediastinal, hilar or axillary lymphadenopathy. LUNGS AND PLEURA: The lungs are without acute process. No focal consolidation or pulmonary edema. No pleural effusion or pneumothorax. UPPER ABDOMEN: Limited images of the upper abdomen are unremarkable. SOFT TISSUES AND BONES: No acute bone or soft tissue abnormality. IMPRESSION: 1. No evidence of pulmonary embolism. Electronically signed by: Oneil Devonshire MD 04/14/2024 08:20 PM EST RP Workstation: [id number]   DG Chest 2 View Result Date: 04/14/2024 EXAM: 2 VIEW(S) XRAY OF THE CHEST 04/14/2024 05:45:00 PM COMPARISON: Chest x ray 02/15/2017. CLINICAL HISTORY: chest pain FINDINGS: LUNGS AND PLEURA: No  focal pulmonary opacity. No pleural effusion. No pneumothorax. HEART AND MEDIASTINUM: No acute abnormality of the cardiac and mediastinal silhouettes. BONES AND SOFT TISSUES: No acute osseous abnormality. IMPRESSION: 1. No acute cardiopulmonary process. Electronically signed by: Greig Pique MD 04/14/2024 06:29 PM EST RP Workstation: [id number]    Labs: CBC    Component Value Date/Time   WBC 5.5 04/16/2024 1010   RBC 4.22 04/16/2024 1010   HGB 12.2 04/16/2024 1010   HCT 36.4 04/16/2024 1010   PLT 257.0 04/16/2024 1010   MCV 86.3 04/16/2024 1010    CMP     Component Value Date/Time   NA 138 04/14/2024 1543   K 4.2 04/14/2024 1543   CL 104 04/14/2024 1543   CO2 24 04/14/2024 1543   GLUCOSE 91 04/14/2024 1543   BUN 9 04/14/2024 1543   CREATININE 0.56 04/14/2024 1543   CREATININE 0.83 09/21/2017 1607   CALCIUM 9.0 04/14/2024 1543   PROT 7.2 04/14/2024 1543   ALBUMIN 4.6 04/14/2024 1543    AST 21 04/14/2024 1543   ALT 11 04/14/2024 1543   ALKPHOS 91 04/14/2024 1543   BILITOT <0.2 04/14/2024 1543   GFRNONAA >60 04/14/2024 1543    Assessment and Plan:   Larene  Buchbinder is a 33 y.o. y/o female has been referred for   1.  ED follow-up transient intussusception in the jejunum - Repeat abdominal pelvic CT enterography with contrast - Treat constipation with MiraLAX 1 capful every day. - She has follow-up appointment scheduled with general surgery in January 2026. - ED precautions discussed.  If she develops worsening severe abdominal pain, then she is instructed to go to [facility] or [facility].  2.  Anemia: Possibly due to menorrhagia.  Cannot rule out GI bleeding source.  She has had black stools concerning for upper GI bleed. - Labs: CBC, iron  panel, ferritin, B12, folate. -Continue oral iron  every other day.  If iron  is low, then increase to every day iron  dose.  Also consider repeat IV iron  infusion if needed. - Scheduling EGD & Colonoscopy I discussed risks of EGD and colonoscopy with patient to include risk of bleeding, perforation, and risk of sedation.  Patient expressed understanding and agrees to proceed with procedures.    3.  RUQ pain - Ordered right upper quadrant ultrasound to rule out gallstones. - Recommend low-fat diet.  4.  Epigastric pain.  GERD.  Possible gastritis. - She is advised to avoid all NSAIDs. - Prescribed pantoprazole  40 mg 1 tablet once daily. - Scheduling EGD I discussed risks of EGD with patient to include risk of bleeding, perforation, and risk of sedation.  Patient expressed understanding and agrees to proceed with EGD.   5.  Constipation - Start MiraLAX powder, mix 1 capful in a drink once daily. - Drink 64 ounces of fluids daily.   Follow up 4 weeks after EGD and colonoscopy results.  Valerie Console, PA-C       [1]  Social History Tobacco Use   Smoking status: Never   Smokeless tobacco: Never  Vaping Use   Vaping status:  Never Used  Substance Use Topics   Alcohol use: Not Currently   Drug use: No

## 2024-04-16 NOTE — Patient Instructions (Addendum)
 Your provider has requested that you go to the basement level for lab work before leaving today. Press B on the elevator. The lab is located at the first door on the left as you exit the elevator.  You have been scheduled for a CT scan of the abdomen and pelvis at [facility], 1st floor Radiology. You are scheduled on 04/23/24 at 8:00 am. You should arrive 30 minutes prior to your appointment time for registration. You may take any medications as prescribed with a small amount of water , if necessary. Do not eat or drink after 4:00 am. If you take any of the following medications: METFORMIN, GLUCOPHAGE, GLUCOVANCE, AVANDAMET, RIOMET, FORTAMET, ACTOPLUS MET, JANUMET, GLUMETZA or METAGLIP, you MAY be asked to HOLD this medication 48 hours AFTER the exam. If you have any questions regarding your exam or if you need to reschedule, you may call [facility] at [phone] between the hours of 8:00 am and 5:00 pm, Monday-Friday.     For constipation: Start OTC Miralax Powder Mix 1 capful in 6 to 8 ounces of a drink once daily  Recommend high-fiber diet, 30 g of fiber daily Eat fruits, vegetables, and whole grains Drink 64 ounces of water  / fluids daily.   We have sent the following medications to your pharmacy for you to pick up at your convenience: Pantoprazole  40 mg once daily  You have been scheduled for an Endoscopy and Colonoscopy. Please follow the written instructions given to you at your visit today.  If you use inhalers (even only as needed), please bring them with you on the day of your procedure.  DO NOT TAKE 7 DAYS PRIOR TO TEST- Trulicity (dulaglutide) Ozempic, Wegovy (semaglutide) Mounjaro (tirzepatide) Bydureon Bcise (exanatide extended release)  DO NOT TAKE 1 DAY PRIOR TO YOUR TEST Rybelsus (semaglutide) Adlyxin (lixisenatide) Victoza (liraglutide) Byetta (exanatide) ___________________________________________________________________________  Please  follow up sooner if symptoms increase or worsen __________________________________________________________________________  Due to recent changes in healthcare laws, you may see the results of your imaging and laboratory studies on MyChart before your provider has had a chance to review them.  We understand that in some cases there may be results that are confusing or concerning to you. Not all laboratory results come back in the same time frame and the provider may be waiting for multiple results in order to interpret others.  Please give us  48 hours in order for your provider to thoroughly review all the results before contacting the office for clarification of your results.   Thank you for trusting me with your gastrointestinal care!   Ellouise Console, PA-C _______________________________________________________  If your blood pressure at your visit was 140/90 or greater, please contact your primary care physician to follow up on this.  _______________________________________________________  If you are age 33 or older, your body mass index should be between 23-30. Your Body mass index is 27.88 kg/m. If this is out of the aforementioned range listed, please consider follow up with your Primary Care Provider.  If you are age 26 or younger, your body mass index should be between 19-25. Your Body mass index is 27.88 kg/m. If this is out of the aformentioned range listed, please consider follow up with your Primary Care Provider.   ________________________________________________________  The [facility] providers would like to encourage you to use MYCHART to communicate with providers for non-urgent requests or questions.  Due to long hold times on the telephone, sending your provider a message by [facility] may be a faster  and more efficient way to get a response.  Please allow 48 business hours for a response.  Please remember that this is for non-urgent requests.   _______________________________________________________

## 2024-04-17 ENCOUNTER — Ambulatory Visit: Payer: Self-pay | Admitting: Physician Assistant

## 2024-04-17 LAB — IRON,TIBC AND FERRITIN PANEL
%SAT: 11 % — ABNORMAL LOW (ref 16–45)
Ferritin: 9 ng/mL — ABNORMAL LOW (ref 16–154)
Iron: 52 ug/dL (ref 40–190)
TIBC: 487 ug/dL — ABNORMAL HIGH (ref 250–450)

## 2024-04-18 ENCOUNTER — Other Ambulatory Visit (HOSPITAL_COMMUNITY): Payer: Self-pay

## 2024-04-21 ENCOUNTER — Other Ambulatory Visit (HOSPITAL_COMMUNITY): Payer: Self-pay

## 2024-04-21 ENCOUNTER — Emergency Department (HOSPITAL_COMMUNITY)

## 2024-04-21 ENCOUNTER — Other Ambulatory Visit: Payer: Self-pay

## 2024-04-21 ENCOUNTER — Emergency Department (HOSPITAL_COMMUNITY): Admission: EM | Admit: 2024-04-21 | Discharge: 2024-04-21 | Disposition: A | Discharge status: Home / Self Care

## 2024-04-21 ENCOUNTER — Encounter (HOSPITAL_COMMUNITY): Payer: Self-pay

## 2024-04-21 DIAGNOSIS — R197 Diarrhea, unspecified: Secondary | ICD-10-CM | POA: Diagnosis not present

## 2024-04-21 DIAGNOSIS — R109 Unspecified abdominal pain: Secondary | ICD-10-CM | POA: Diagnosis present

## 2024-04-21 DIAGNOSIS — R110 Nausea: Secondary | ICD-10-CM | POA: Diagnosis not present

## 2024-04-21 DIAGNOSIS — K529 Noninfective gastroenteritis and colitis, unspecified: Secondary | ICD-10-CM

## 2024-04-21 LAB — LIPASE, BLOOD: Lipase: 21 U/L (ref 11–51)

## 2024-04-21 LAB — COMPREHENSIVE METABOLIC PANEL WITH GFR
ALT: 11 U/L (ref 0–44)
AST: 21 U/L (ref 15–41)
Albumin: 4.4 g/dL (ref 3.5–5.0)
Alkaline Phosphatase: 96 U/L (ref 38–126)
Anion gap: 13 (ref 5–15)
BUN: 6 mg/dL (ref 6–20)
CO2: 21 mmol/L — ABNORMAL LOW (ref 22–32)
Calcium: 9.1 mg/dL (ref 8.9–10.3)
Chloride: 105 mmol/L (ref 98–111)
Creatinine, Ser: 0.58 mg/dL (ref 0.44–1.00)
GFR, Estimated: >60 mL/min
Glucose, Bld: 97 mg/dL (ref 70–99)
Potassium: 3.6 mmol/L (ref 3.5–5.1)
Sodium: 139 mmol/L (ref 135–145)
Total Bilirubin: 0.2 mg/dL (ref 0.0–1.2)
Total Protein: 7.4 g/dL (ref 6.5–8.1)

## 2024-04-21 LAB — CBC
HCT: 37.5 % (ref 36.0–46.0)
Hemoglobin: 12 g/dL (ref 12.0–15.0)
MCH: 28.4 pg (ref 26.0–34.0)
MCHC: 32 g/dL (ref 30.0–36.0)
MCV: 88.9 fL (ref 80.0–100.0)
Platelets: 243 K/uL (ref 150–400)
RBC: 4.22 MIL/uL (ref 3.87–5.11)
RDW: 12.7 % (ref 11.5–15.5)
WBC: 5.5 K/uL (ref 4.0–10.5)
nRBC: 0 % (ref 0.0–0.2)

## 2024-04-21 LAB — URINALYSIS, ROUTINE W REFLEX MICROSCOPIC
Bilirubin Urine: NEGATIVE
Glucose, UA: NEGATIVE mg/dL
Ketones, ur: 5 mg/dL — AB
Nitrite: NEGATIVE
Protein, ur: 30 mg/dL — AB
Specific Gravity, Urine: 1.032 — ABNORMAL HIGH (ref 1.005–1.030)
pH: 5 (ref 5.0–8.0)

## 2024-04-21 LAB — HCG, SERUM, QUALITATIVE: Preg, Serum: NEGATIVE

## 2024-04-21 MED ORDER — DICYCLOMINE HCL 20 MG PO TABS
20.0000 mg | ORAL_TABLET | Freq: Two times a day (BID) | ORAL | 0 refills | Status: AC
  Filled 2024-04-21: qty 20, 10d supply, fill #0

## 2024-04-21 MED ORDER — SODIUM CHLORIDE 0.9 % IV BOLUS
1000.0000 mL | Freq: Once | INTRAVENOUS | Status: AC
  Administered 2024-04-21: 1000 mL via INTRAVENOUS

## 2024-04-21 MED ORDER — ONDANSETRON 4 MG PO TBDP
4.0000 mg | ORAL_TABLET | Freq: Three times a day (TID) | ORAL | 0 refills | Status: AC | PRN
  Filled 2024-04-21: qty 20, 7d supply, fill #0

## 2024-04-21 MED ORDER — IOHEXOL 300 MG/ML  SOLN: 100.0000 mL | Freq: Once | INTRAMUSCULAR | Status: DC | PRN

## 2024-04-21 MED ORDER — ONDANSETRON 4 MG PO TBDP
4.0000 mg | ORAL_TABLET | Freq: Once | ORAL | Status: AC
  Administered 2024-04-21: 4 mg via ORAL
  Filled 2024-04-21: qty 1

## 2024-04-21 MED ORDER — DICYCLOMINE HCL 20 MG PO TABS
20.0000 mg | ORAL_TABLET | ORAL | Status: AC
  Administered 2024-04-21: 20 mg via ORAL
  Filled 2024-04-21: qty 1

## 2024-04-21 MED ORDER — IOHEXOL 300 MG/ML  SOLN
100.0000 mL | Freq: Once | INTRAMUSCULAR | Status: AC | PRN
  Administered 2024-04-21: 100 mL via INTRAVENOUS

## 2024-04-21 NOTE — ED Provider Notes (Signed)
 " [place] [facility]ERGENC[facility] DEPARTMENT AT [facility] Provider Note   CSN: [id number] Arrival date & time: 04/21/24  9244     Patient presents with: Abdominal Pain   Valerie  Graham is a 33 y.o. female.    Abdominal Pain  33 year old female presenting with abdominal pain.  Patient was seen in the ER on 04/14/2024 for abdominal pain and diarrhea.  During that time she was diagnosed with transient small bowel intussusception.  Since then she followed up with a GI doctor and they recommended a low-fat diet and MiraLAX.  She has a follow-up CT scheduled for 12th 2425.  She is also scheduled for a colonoscopy and endoscopy in February.  She is here today because she is still having diarrhea.  Every time she eats or drinks anything she immediately has diarrhea.  She is also having lower left and right quadrant tenderness.  Patient reports that she also felt nauseous sometimes but has not thrown up.  She is not currently nauseous    Prior to Admission medications  Medication Sig Start Date End Date Taking? Authorizing Provider  albuterol  (VENTOLIN  HFA) 108 (90 Base) MCG/ACT inhaler Inhale 1-2 puffs into the lungs every 6 (six) hours as needed for wheezing or shortness of breath. Patient not taking: Reported on 04/16/2024 06/02/23   Emilio Delilah HERO, CNM  famotidine  (PEPCID ) 40 MG tablet Take 40 mg by mouth daily. Patient not taking: Reported on 04/16/2024    [provider]  Ferrous Sulfate  (IRON  PO) Take 1 tablet by mouth daily. Patient not taking: Reported on 04/16/2024    [provider]  guaifenesin  (ROBITUSSIN) 100 MG/5ML syrup Take 200 mg by mouth 3 (three) times daily as needed for cough. Patient not taking: Reported on 04/16/2024    [provider]  ibuprofen  (ADVIL ) 600 MG tablet Take 1 tablet (600 mg total) by mouth every 6 (six) hours. Patient not taking: Reported on 04/16/2024 06/09/23   Gretta Gums, MD  oxyCODONE  (OXY IR/ROXICODONE ) 5 MG  immediate release tablet Take 1-2 tablets (5-10 mg total) by mouth every 6 (six) hours as needed for moderate pain (pain score 4-6). Patient not taking: Reported on 04/16/2024 06/09/23   Gretta Gums, MD  pantoprazole  (PROTONIX ) 40 MG tablet Take 1 tablet (40 mg total) by mouth daily. 04/16/24   [place], PA-C  Prenatal Vit-Fe Fumarate-FA (PRENATAL MULTIVITAMIN) TABS tablet Take 1 tablet by mouth daily at 12 noon. Patient not taking: Reported on 04/16/2024    [provider]  sertraline  (ZOLOFT ) 50 MG tablet Take 1.5 tablets (75 mg total) by mouth daily. 06/09/23 06/08/24  Gretta Gums, MD    Allergies: Penicillins    Review of Systems  Gastrointestinal:  Positive for abdominal pain.  All other systems reviewed and are negative.   Updated Vital Signs BP 109/72 (BP Location: Right Arm)   Pulse (!) 103   Temp 98.1 F (36.7 C) (Oral)   Resp 16   Ht 5' 7 (1.702 m)   Wt 81.6 kg   LMP 04/13/2024   SpO2 100%   BMI 28.19 kg/m   Physical Exam Vitals and nursing note reviewed.  HENT:     Mouth/Throat:     Pharynx: Oropharynx is clear.  Cardiovascular:     Rate and Rhythm: Normal rate.     Pulses: Normal pulses.  Pulmonary:     Effort: Pulmonary effort is normal.     Breath sounds: Normal breath sounds.  Abdominal:     General: Abdomen is  flat. Bowel sounds are normal. There is no distension.     Palpations: Abdomen is soft.     Tenderness: There is abdominal tenderness in the right lower quadrant, periumbilical area, suprapubic area and left lower quadrant. There is guarding (voluntary). There is no rebound. Negative signs include Murphy's sign, Rovsing's sign and McBurney's sign.  Skin:    General: Skin is warm and dry.  Neurological:     General: No focal deficit present.     Mental Status: She is alert.     (all labs ordered are listed, but only abnormal results are displayed) Labs Reviewed  LIPASE, BLOOD  COMPREHENSIVE METABOLIC PANEL WITH GFR  CBC   URINALYSIS, ROUTINE W REFLEX MICROSCOPIC  HCG, SERUM, QUALITATIVE    EKG: None  Radiology: No results found.   Procedures   Medications Ordered in the ED  dicyclomine  (BENTYL ) tablet 20 mg (has no administration in time range)  sodium chloride  0.9 % bolus 1,000 mL (has no administration in time range)  ondansetron  (ZOFRAN -ODT) disintegrating tablet 4 mg (4 mg Oral Given 04/21/24 0854)                                    Medical Decision Making Amount and/or Complexity of Data Reviewed Labs: ordered. Radiology: ordered.  Risk Prescription drug management.   Impression: 33 year old female presenting with abdominal pain.  Differential diagnosis including gastroenteritis, pancreatitis, AAA, intussusception, small bowel obstruction  Additional History: Patient was able to provide history.  I also reviewed previous GI visit and ER visit.  Labs: CBC showed no signs of infection or anemia.  CMP showed normal kidney function and liver function and no electrolyte abnormalities.  Lipase was within normal limits.  Pregnancy test was negative urinalysis showed dehydration.  Imaging: CT of the abdomen showed resolution of the intussusception and bowel wall thickening in the intestines.  ED Course/Meds: Patient remained stable in the ER.  She received Bentyl , Zofran  and fluids.  She reports after receiving the she was feeling much better after taking out labs and imaging there are no acute findings.  Patient was educated to continue the Bentyl  that was sent into her pharmacy as needed for the pain.  Patient is to follow-up with her GI doctor with these changes.  If patient's pain worsens or she starts developing bloody diarrhea she is to return to the ER.      Final diagnoses:  None    ED Discharge Orders     None          [name] [name] 04/21/24 1322    Ula Prentice SAUNDERS, MD 04/21/24 1422  "

## 2024-04-21 NOTE — Discharge Instructions (Addendum)
 Patient taking medication as prescribed.  Follow-up with your GI doctor if the symptoms continue.  If your symptoms worsen and you start to have any bloody diarrhea or worsening abdominal pain return to ER.

## 2024-04-21 NOTE — ED Triage Notes (Signed)
 Generalized abdominal pain, nausea and diarrhea for a week. Pt states her stool has now turned yellow. Pt was supposed to be admitted last week for intussusception, but was waiting for a bed for over 24 hours at [facility]. Pain is worse after eating. Scheduled for CT scan tomorrow.

## 2024-04-23 ENCOUNTER — Ambulatory Visit (HOSPITAL_COMMUNITY)

## 2024-06-03 ENCOUNTER — Other Ambulatory Visit (HOSPITAL_COMMUNITY): Payer: Self-pay

## 2024-06-03 MED ORDER — SERTRALINE HCL 50 MG PO TABS
75.0000 mg | ORAL_TABLET | Freq: Every day | ORAL | 0 refills | Status: AC
  Filled 2024-06-03: qty 180, 120d supply, fill #0

## 2024-06-04 ENCOUNTER — Other Ambulatory Visit (HOSPITAL_COMMUNITY): Payer: Self-pay

## 2024-06-05 ENCOUNTER — Encounter: Admitting: Nurse Practitioner

## 2024-06-05 ENCOUNTER — Encounter: Admitting: Internal Medicine
